# Patient Record
Sex: Female | Born: 1960 | Race: White | Hispanic: No | Marital: Married | Smoking: Current every day smoker
Health system: Southern US, Community
[De-identification: ages and names within clinical notes are randomized; demographics above are authoritative.]

## PROBLEM LIST (undated history)

## (undated) DIAGNOSIS — I639 Cerebral infarction, unspecified: Secondary | ICD-10-CM

## (undated) DIAGNOSIS — E785 Hyperlipidemia, unspecified: Secondary | ICD-10-CM

## (undated) DIAGNOSIS — G4733 Obstructive sleep apnea (adult) (pediatric): Secondary | ICD-10-CM

## (undated) DIAGNOSIS — F419 Anxiety disorder, unspecified: Secondary | ICD-10-CM

## (undated) HISTORY — PX: ABDOMINAL HYSTERECTOMY: SHX81

---

## 2008-11-21 ENCOUNTER — Emergency Department (HOSPITAL_COMMUNITY): Admission: EM | Admit: 2008-11-21 | Discharge: 2008-11-21 | Payer: Self-pay | Admitting: Emergency Medicine

## 2013-07-13 ENCOUNTER — Emergency Department (HOSPITAL_COMMUNITY): Payer: BC Managed Care – PPO

## 2013-07-13 ENCOUNTER — Inpatient Hospital Stay (HOSPITAL_COMMUNITY)
Admission: EM | Admit: 2013-07-13 | Discharge: 2013-07-15 | DRG: 066 | Disposition: A | Discharge status: Home / Self Care | Payer: BC Managed Care – PPO | Attending: Internal Medicine | Admitting: Internal Medicine

## 2013-07-13 ENCOUNTER — Encounter (HOSPITAL_COMMUNITY): Payer: Self-pay | Admitting: Emergency Medicine

## 2013-07-13 DIAGNOSIS — E781 Pure hyperglyceridemia: Secondary | ICD-10-CM | POA: Diagnosis present

## 2013-07-13 DIAGNOSIS — H5462 Unqualified visual loss, left eye, normal vision right eye: Secondary | ICD-10-CM | POA: Diagnosis present

## 2013-07-13 DIAGNOSIS — Z79890 Hormone replacement therapy (postmenopausal): Secondary | ICD-10-CM

## 2013-07-13 DIAGNOSIS — Z6831 Body mass index (BMI) 31.0-31.9, adult: Secondary | ICD-10-CM

## 2013-07-13 DIAGNOSIS — E669 Obesity, unspecified: Secondary | ICD-10-CM | POA: Diagnosis present

## 2013-07-13 DIAGNOSIS — H47012 Ischemic optic neuropathy, left eye: Secondary | ICD-10-CM

## 2013-07-13 DIAGNOSIS — H5460 Unqualified visual loss, one eye, unspecified: Secondary | ICD-10-CM | POA: Diagnosis present

## 2013-07-13 DIAGNOSIS — H547 Unspecified visual loss: Secondary | ICD-10-CM | POA: Diagnosis present

## 2013-07-13 DIAGNOSIS — Z79899 Other long term (current) drug therapy: Secondary | ICD-10-CM

## 2013-07-13 DIAGNOSIS — F17200 Nicotine dependence, unspecified, uncomplicated: Secondary | ICD-10-CM | POA: Diagnosis present

## 2013-07-13 DIAGNOSIS — I6350 Cerebral infarction due to unspecified occlusion or stenosis of unspecified cerebral artery: Principal | ICD-10-CM | POA: Diagnosis present

## 2013-07-13 DIAGNOSIS — F411 Generalized anxiety disorder: Secondary | ICD-10-CM | POA: Diagnosis present

## 2013-07-13 DIAGNOSIS — I639 Cerebral infarction, unspecified: Secondary | ICD-10-CM | POA: Diagnosis present

## 2013-07-13 DIAGNOSIS — E785 Hyperlipidemia, unspecified: Secondary | ICD-10-CM | POA: Diagnosis present

## 2013-07-13 DIAGNOSIS — I10 Essential (primary) hypertension: Secondary | ICD-10-CM | POA: Diagnosis present

## 2013-07-13 DIAGNOSIS — G4733 Obstructive sleep apnea (adult) (pediatric): Secondary | ICD-10-CM | POA: Diagnosis present

## 2013-07-13 HISTORY — DX: Hyperlipidemia, unspecified: E78.5

## 2013-07-13 HISTORY — DX: Anxiety disorder, unspecified: F41.9

## 2013-07-13 HISTORY — DX: Obstructive sleep apnea (adult) (pediatric): G47.33

## 2013-07-13 LAB — I-STAT CHEM 8, ED
BUN: 15 mg/dL (ref 6–23)
CALCIUM ION: 1.27 mmol/L — AB (ref 1.12–1.23)
Chloride: 100 mEq/L (ref 96–112)
Creatinine, Ser: 0.9 mg/dL (ref 0.50–1.10)
Glucose, Bld: 116 mg/dL — ABNORMAL HIGH (ref 70–99)
HEMATOCRIT: 41 % (ref 36.0–46.0)
Hemoglobin: 13.9 g/dL (ref 12.0–15.0)
Potassium: 3.2 mEq/L — ABNORMAL LOW (ref 3.7–5.3)
Sodium: 142 mEq/L (ref 137–147)
TCO2: 28 mmol/L (ref 0–100)

## 2013-07-13 LAB — CBC WITH DIFFERENTIAL/PLATELET
BASOS ABS: 0 10*3/uL (ref 0.0–0.1)
BASOS PCT: 0 % (ref 0–1)
EOS PCT: 1 % (ref 0–5)
Eosinophils Absolute: 0.1 10*3/uL (ref 0.0–0.7)
HCT: 38.6 % (ref 36.0–46.0)
Hemoglobin: 13.6 g/dL (ref 12.0–15.0)
Lymphocytes Relative: 27 % (ref 12–46)
Lymphs Abs: 2.4 10*3/uL (ref 0.7–4.0)
MCH: 31.5 pg (ref 26.0–34.0)
MCHC: 35.2 g/dL (ref 30.0–36.0)
MCV: 89.4 fL (ref 78.0–100.0)
MONO ABS: 0.8 10*3/uL (ref 0.1–1.0)
Monocytes Relative: 9 % (ref 3–12)
NEUTROS ABS: 5.5 10*3/uL (ref 1.7–7.7)
Neutrophils Relative %: 63 % (ref 43–77)
Platelets: 346 10*3/uL (ref 150–400)
RBC: 4.32 MIL/uL (ref 3.87–5.11)
RDW: 13 % (ref 11.5–15.5)
WBC: 8.8 10*3/uL (ref 4.0–10.5)

## 2013-07-13 NOTE — ED Notes (Signed)
Patient states she has been having trouble seeing in her left eye since last Thursday.  Patient is from [facility] in [place].  Patient states she does have some eye pain and has a "gray line" in her field of vision.  Patient states that she can only see below the line.  She can not see in front of her and she can see to the right and left.

## 2013-07-14 ENCOUNTER — Inpatient Hospital Stay (HOSPITAL_COMMUNITY): Payer: BC Managed Care – PPO

## 2013-07-14 DIAGNOSIS — H5462 Unqualified visual loss, left eye, normal vision right eye: Secondary | ICD-10-CM | POA: Diagnosis present

## 2013-07-14 DIAGNOSIS — H5460 Unqualified visual loss, one eye, unspecified: Secondary | ICD-10-CM

## 2013-07-14 DIAGNOSIS — H47019 Ischemic optic neuropathy, unspecified eye: Secondary | ICD-10-CM

## 2013-07-14 DIAGNOSIS — I6350 Cerebral infarction due to unspecified occlusion or stenosis of unspecified cerebral artery: Principal | ICD-10-CM

## 2013-07-14 DIAGNOSIS — I639 Cerebral infarction, unspecified: Secondary | ICD-10-CM | POA: Diagnosis present

## 2013-07-14 DIAGNOSIS — I6789 Other cerebrovascular disease: Secondary | ICD-10-CM

## 2013-07-14 DIAGNOSIS — H547 Unspecified visual loss: Secondary | ICD-10-CM | POA: Diagnosis present

## 2013-07-14 LAB — COMPREHENSIVE METABOLIC PANEL
ALK PHOS: 70 U/L (ref 39–117)
ALT: 11 U/L (ref 0–35)
AST: 15 U/L (ref 0–37)
Albumin: 3.9 g/dL (ref 3.5–5.2)
BUN: 18 mg/dL (ref 6–23)
CHLORIDE: 96 meq/L (ref 96–112)
CO2: 27 meq/L (ref 19–32)
Calcium: 10.1 mg/dL (ref 8.4–10.5)
Creatinine, Ser: 0.79 mg/dL (ref 0.50–1.10)
GFR calc non Af Amer: >90 mL/min (ref >90)
GLUCOSE: 96 mg/dL (ref 70–99)
POTASSIUM: 3.4 meq/L — AB (ref 3.7–5.3)
Sodium: 139 mEq/L (ref 137–147)
Total Protein: 7.7 g/dL (ref 6.0–8.3)

## 2013-07-14 LAB — HEMOGLOBIN A1C
Hgb A1c MFr Bld: 5.3 % (ref <5.7)
Mean Plasma Glucose: 105 mg/dL (ref <117)

## 2013-07-14 LAB — URINALYSIS, ROUTINE W REFLEX MICROSCOPIC
Bilirubin Urine: NEGATIVE
GLUCOSE, UA: NEGATIVE mg/dL
Hgb urine dipstick: NEGATIVE
Ketones, ur: NEGATIVE mg/dL
Leukocytes, UA: NEGATIVE
Nitrite: NEGATIVE
Protein, ur: NEGATIVE mg/dL
SPECIFIC GRAVITY, URINE: 1.011 (ref 1.005–1.030)
UROBILINOGEN UA: 0.2 mg/dL (ref 0.0–1.0)
pH: 6.5 (ref 5.0–8.0)

## 2013-07-14 LAB — C-REACTIVE PROTEIN: CRP: 1.6 mg/dL — AB (ref <0.60)

## 2013-07-14 LAB — LIPID PANEL
Cholesterol: 212 mg/dL — ABNORMAL HIGH (ref 0–200)
HDL: 29 mg/dL — ABNORMAL LOW (ref >39)
LDL Cholesterol: UNDETERMINED mg/dL (ref 0–99)
Total CHOL/HDL Ratio: 7.3 RATIO
Triglycerides: 531 mg/dL — ABNORMAL HIGH (ref <150)
VLDL: UNDETERMINED mg/dL (ref 0–40)

## 2013-07-14 LAB — RAPID URINE DRUG SCREEN, HOSP PERFORMED
Amphetamines: NOT DETECTED
Barbiturates: NOT DETECTED
Benzodiazepines: NOT DETECTED
Cocaine: NOT DETECTED
Opiates: NOT DETECTED
TETRAHYDROCANNABINOL: NOT DETECTED

## 2013-07-14 LAB — I-STAT TROPONIN, ED: Troponin i, poc: 0 ng/mL (ref 0.00–0.08)

## 2013-07-14 LAB — PROTIME-INR
INR: 0.89 (ref 0.00–1.49)
Prothrombin Time: 11.9 seconds (ref 11.6–15.2)

## 2013-07-14 LAB — APTT: aPTT: 29 seconds (ref 24–37)

## 2013-07-14 LAB — SEDIMENTATION RATE: SED RATE: 20 mm/h (ref 0–22)

## 2013-07-14 LAB — ETHANOL

## 2013-07-14 MED ORDER — ATORVASTATIN CALCIUM 10 MG PO TABS
20.0000 mg | ORAL_TABLET | Freq: Every day | ORAL | Status: DC
  Administered 2013-07-14: 20 mg via ORAL
  Filled 2013-07-14: qty 2

## 2013-07-14 MED ORDER — CETIRIZINE HCL 10 MG PO TABS
10.0000 mg | ORAL_TABLET | Freq: Every day | ORAL | Status: DC
  Administered 2013-07-14 – 2013-07-15 (×2): 10 mg via ORAL
  Filled 2013-07-14 (×2): qty 1

## 2013-07-14 MED ORDER — DESVENLAFAXINE SUCCINATE ER 100 MG PO TB24
100.0000 mg | ORAL_TABLET | Freq: Every day | ORAL | Status: DC
  Administered 2013-07-14 – 2013-07-15 (×2): 100 mg via ORAL
  Filled 2013-07-14 (×2): qty 1

## 2013-07-14 MED ORDER — ESTRADIOL 1 MG PO TABS
1.0000 mg | ORAL_TABLET | Freq: Every day | ORAL | Status: DC
  Filled 2013-07-14: qty 1

## 2013-07-14 MED ORDER — LORAZEPAM 2 MG/ML IJ SOLN
2.0000 mg | Freq: Once | INTRAMUSCULAR | Status: AC
  Administered 2013-07-14: 2 mg via INTRAVENOUS
  Filled 2013-07-14: qty 1

## 2013-07-14 MED ORDER — FENOFIBRATE 160 MG PO TABS
160.0000 mg | ORAL_TABLET | Freq: Every day | ORAL | Status: DC
  Administered 2013-07-14 – 2013-07-15 (×2): 160 mg via ORAL
  Filled 2013-07-14 (×2): qty 1

## 2013-07-14 MED ORDER — HEPARIN SODIUM (PORCINE) 5000 UNIT/ML IJ SOLN
5000.0000 [IU] | Freq: Three times a day (TID) | INTRAMUSCULAR | Status: DC
  Administered 2013-07-14 – 2013-07-15 (×4): 5000 [IU] via SUBCUTANEOUS
  Filled 2013-07-14 (×4): qty 1

## 2013-07-14 MED ORDER — PNEUMOCOCCAL VAC POLYVALENT 25 MCG/0.5ML IJ INJ
0.5000 mL | INJECTION | Freq: Once | INTRAMUSCULAR | Status: AC
  Administered 2013-07-14: 0.5 mL via INTRAMUSCULAR
  Filled 2013-07-14: qty 0.5

## 2013-07-14 MED ORDER — LEVOCETIRIZINE DIHYDROCHLORIDE 5 MG PO TABS: 5.0000 mg | ORAL_TABLET | Freq: Every evening | ORAL | Status: DC

## 2013-07-14 MED ORDER — HYDROCHLOROTHIAZIDE 25 MG PO TABS
25.0000 mg | ORAL_TABLET | Freq: Every day | ORAL | Status: DC
  Administered 2013-07-14 – 2013-07-15 (×2): 25 mg via ORAL
  Filled 2013-07-14 (×2): qty 1

## 2013-07-14 MED ORDER — IBUPROFEN 400 MG PO TABS
400.0000 mg | ORAL_TABLET | Freq: Four times a day (QID) | ORAL | Status: DC | PRN
  Filled 2013-07-14: qty 1

## 2013-07-14 NOTE — Progress Notes (Signed)
[facility] PROGRESS NOTE  Stephanie Booker A Garoutte ZOX:[id number] DOB: 08/08/1960 DOA: 07/13/2013 PCP: No primary provider on file.  Assessment/Plan: 1. Left eye vision loss.  -Patient reporting blurriness and loss of eyesight to the inferior visual field, started last thursday, progressively worse -She was seen and evaluated by neurology, undergoing ischemic workup. CT scan of brain performed on admission did not reveal acute intracranial abnormalities -Pending MRI of brain, 2-D echo and bilateral carotid Dopplers  2.  Dyslipidemia. -Fasting lipid panel showing total cholesterol of 212 with triglycerides of 531. -Lipitor added  3. Hypertension -Patient on hydrochlorothiazide 25 mg by mouth daily. Blood pressures controlled  Code Status: Full Code Family Communication: Spoke with Family members at bedside Disposition Plan: Pending CVA workup   Consultants:  Neurology    HPI/Subjective: Patient is a pleasant 53 year old female with a past medical history of hypertension, dyslipidemia, obstructive sleep apnea, admitted to the medicine service early this morning, presenting with left-sided visual changes. She reports having vision loss in the inferior aspect of her visual field associated with blurriness that started that thurs, progressively worse. Initial CT scan of brain did not show acute intracranial changes. She was seen and evaluated by neurology, undergoing ischemic work up. Currently pending MRI of brain.   Objective: Filed Vitals:   07/14/13 0944  BP: 113/78  Pulse: 82  Temp:   Resp: 18   No intake or output data in the 24 hours ending 07/14/13 1331 Filed Weights   07/13/13 2008  Weight: 67.677 kg (149 lb 3.2 oz)    Exam:   General:  Patient is sitting up having lunch, in no acute distress, awake alert oriented  Cardiovascular: Regular rate and rhythm normal S1 is  Respiratory: Clear to auscultation bilaterally  Abdomen: Soft nontender  nondistended  Musculoskeletal: No edema  Neurological: Patient having loss of vision to inferior visual fields from her left eye. Extraocular movement was intact in both eyes. Pupils did react to light bilaterally. No evidence of ocular trauma. Rest of neurological exam unremarkable. 5 out of 5 muscle strength without alteration to  Data Reviewed: Basic Metabolic Panel:  Recent Labs Lab 07/13/13 2056 07/14/13 0051  NA 142 139  K 3.2* 3.4*  CL 100 96  CO2  --  27  GLUCOSE 116* 96  BUN 15 18  CREATININE 0.90 0.79  CALCIUM  --  10.1   Liver Function Tests:  Recent Labs Lab 07/14/13 0051  AST 15  ALT 11  ALKPHOS 70  BILITOT <0.2*  PROT 7.7  ALBUMIN 3.9   No results found for this basename: LIPASE, AMYLASE,  in the last 168 hours No results found for this basename: AMMONIA,  in the last 168 hours CBC:  Recent Labs Lab 07/13/13 2020 07/13/13 2056  WBC 8.8  --   NEUTROABS 5.5  --   HGB 13.6 13.9  HCT 38.6 41.0  MCV 89.4  --   PLT 346  --    Cardiac Enzymes: No results found for this basename: CKTOTAL, CKMB, CKMBINDEX, TROPONINI,  in the last 168 hours BNP (last 3 results) No results found for this basename: PROBNP,  in the last 8760 hours CBG: No results found for this basename: GLUCAP,  in the last 168 hours  No results found for this or any previous visit (from the past 240 hour(s)).   Studies: Dg Chest 2 View  07/14/2013   CLINICAL DATA:  Hypertension.  Stroke.  EXAM: CHEST  2 VIEW  COMPARISON:  None.  FINDINGS: Mediastinum and hilar structures are normal. Lungs are clear. Heart size normal. No pleural effusion or pneumothorax. Degenerative changes thoracic spine.  IMPRESSION: No active cardiopulmonary disease.   Electronically Signed   By: Maisie Fushomas  Register   On: 07/14/2013 09:32   Ct Head Wo Contrast  07/13/2013   CLINICAL DATA:  Vision changes, eye pain.  EXAM: CT HEAD WITHOUT CONTRAST  TECHNIQUE: Contiguous axial images were obtained from the base of the  skull through the vertex without intravenous contrast.  COMPARISON:  None.  FINDINGS: There is no acute intracranial hemorrhage or infarct. No mass lesion or midline shift. Gray-white matter differentiation is well maintained. Ventricles are normal in size without evidence of hydrocephalus. CSF containing spaces are within normal limits. No extra-axial fluid collection.  The calvarium is intact.  Orbital soft tissues are within normal limits.  There is near complete opacification of the right sphenoid sinus and right sphenoid ethmoidal recess. Otherwise, the paranasal sinuses and mastoid air cells are well pneumatized and free of fluid.  Scalp soft tissues are unremarkable.  IMPRESSION: 1. Normal head CT with no acute intracranial abnormality identified. 2. Right sphenoid sinus disease.   Electronically Signed   By: Rise MuBenjamin  McClintock M.D.   On: 07/13/2013 20:51    Scheduled Meds: . atorvastatin  20 mg Oral q1800  . cetirizine  10 mg Oral Daily  . desvenlafaxine  100 mg Oral Daily  . fenofibrate  160 mg Oral Daily  . heparin  5,000 Units Subcutaneous 3 times per day  . hydrochlorothiazide  25 mg Oral Daily  . LORazepam  2 mg Intravenous Once   Continuous Infusions:   Principal Problem:   Vision loss, left eye Active Problems:   Stroke    Time spent: 25 min    Jeralyn BennettZAMORA, EZEQUIEL  [facility] Pager [phone]. If 7PM-7AM, please contact night-coverage at [email or web address], password [email or web address] 07/14/2013, 1:31 PM  LOS: 1 day

## 2013-07-14 NOTE — ED Provider Notes (Signed)
CSN: [id number]     Arrival date & time 07/13/13  1905 History   First MD Initiated Contact with Patient 07/14/13 0032     Chief Complaint  Patient presents with  . Eye Pain      Patient is a 53 y.o. female presenting with eye problem. The history is provided by the patient.  Eye Problem Location:  L eye Severity:  Mild Onset quality:  Sudden Duration:  4 days Timing:  Constant Progression:  Worsening Chronicity:  New Relieved by:  None tried Worsened by:  Nothing tried Associated symptoms: no vomiting and no weakness   Pt reports she had episode of severe chest pain last Wednesday It resolved, but then the next day she woke up with decreased vision in left eye No trauma.  She reports only minimal pain.  She has never had eye surgery She has never had issues with her left eye  She reports she was seen [place] eye [place] and sent to ER for evaluation of "stroke or a heart attack" She reports they did dilate her eyes today at the office  No slurred speech or focal weakness reported   Past Medical History  Diagnosis Date  . Hypertension   . Hyperlipidemia   . Anxiety   . OSA (obstructive sleep apnea)    Past Surgical History  Procedure Laterality Date  . Abdominal hysterectomy     History reviewed. No pertinent family history. History  Substance Use Topics  . Smoking status: Current Every Day Smoker -- 1.00 packs/day  . Smokeless tobacco: Not on file  . Alcohol Use: No   OB History   Grav Para Term Preterm Abortions TAB SAB Ect Mult Living                 Review of Systems  Constitutional: Negative for fever.  Eyes: Positive for visual disturbance.  Respiratory: Negative for shortness of breath.   Gastrointestinal: Negative for vomiting.  Neurological: Negative for speech difficulty and weakness.  All other systems reviewed and are negative.     Allergies  Review of patient's allergies indicates no known allergies.  Home Medications    Prior to Admission medications   Medication Sig Start Date End Date Taking? Authorizing Shadavia Dampier  desvenlafaxine (PRISTIQ) 100 MG 24 hr tablet Take 100 mg by mouth daily.   Yes Historical Claressa Hughley, MD  estradiol (ESTRACE) 1 MG tablet Take 1 mg by mouth daily.   Yes Historical Shalin Linders, MD  fenofibrate 160 MG tablet Take 160 mg by mouth daily.   Yes Historical Lastacia Solum, MD  hydrochlorothiazide (HYDRODIURIL) 25 MG tablet Take 25 mg by mouth daily.   Yes Historical Emmauel Hallums, MD  ibuprofen (ADVIL,MOTRIN) 200 MG tablet Take 400 mg by mouth every 6 (six) hours as needed for headache (pain).   Yes Historical Corene Resnick, MD  levocetirizine (XYZAL) 5 MG tablet Take 5 mg by mouth every evening.   Yes Historical Ngai Parcell, MD   BP 114/75  Pulse 94  Temp(Src) 98.4 F (36.9 C) (Oral)  Resp 16  Ht 4\' 10"  (1.473 m)  Wt 149 lb 3.2 oz (67.677 kg)  BMI 31.19 kg/m2  SpO2 96% Physical Exam CONSTITUTIONAL: Well developed/well nourished HEAD: Normocephalic/atraumatic EYES: EOMI/PERRL. No ptosis. She reports decreased vision in OS, she reports she can only see in lower field of vision. ENMT: Mucous membranes moist NECK: supple no meningeal signs SPINE:entire spine nontender CV: S1/S2 noted, no murmurs/rubs/gallops noted LUNGS: Lungs are clear to auscultation bilaterally, no apparent  distress ABDOMEN: soft, nontender, no rebound or guarding GU:no cva tenderness NEURO: Pt is awake/alert, moves all extremitiesx4 No arm/leg drift No facial droop noted No focal sensory deficit.  EXTREMITIES: pulses normal, full ROM SKIN: warm, color normal PSYCH: no abnormalities of mood noted  ED Course  Procedures  1:16 AM Pt sent from eye specialist in [place] for possible acute ischemic optic neuropathy 1:30 AM D/w dr Karleen Hampshirespencer on call for eye recommends stroke workup but also eval for temporal arteritis 3:14 AM D/w dr Posey Prontoreyndols with neuro and dr gardner with [facility] Will admit for stroke  workup  Labs Review Labs Reviewed  I-STAT CHEM 8, ED - Abnormal; Notable for the following:    Potassium 3.2 (*)    Glucose, Bld 116 (*)    Calcium, Ion 1.27 (*)    All other components within normal limits  CBC WITH DIFFERENTIAL  ETHANOL  PROTIME-INR  APTT  COMPREHENSIVE METABOLIC PANEL  URINE RAPID DRUG SCREEN (HOSP PERFORMED)  URINALYSIS, ROUTINE W REFLEX MICROSCOPIC  I-STAT TROPOININ, ED  I-STAT TROPOININ, ED    Imaging Review Ct Head Wo Contrast  07/13/2013   CLINICAL DATA:  Vision changes, eye pain.  EXAM: CT HEAD WITHOUT CONTRAST  TECHNIQUE: Contiguous axial images were obtained from the base of the skull through the vertex without intravenous contrast.  COMPARISON:  None.  FINDINGS: There is no acute intracranial hemorrhage or infarct. No mass lesion or midline shift. Gray-white matter differentiation is well maintained. Ventricles are normal in size without evidence of hydrocephalus. CSF containing spaces are within normal limits. No extra-axial fluid collection.  The calvarium is intact.  Orbital soft tissues are within normal limits.  There is near complete opacification of the right sphenoid sinus and right sphenoid ethmoidal recess. Otherwise, the paranasal sinuses and mastoid air cells are well pneumatized and free of fluid.  Scalp soft tissues are unremarkable.  IMPRESSION: 1. Normal head CT with no acute intracranial abnormality identified. 2. Right sphenoid sinus disease.   Electronically Signed   By: Rise MuBenjamin  McClintock M.D.   On: 07/13/2013 20:51     EKG Interpretation   Date/Time:  Tuesday July 14 2013 00:54:12 EDT Ventricular Rate:  90 PR Interval:  173 QRS Duration: 86 QT Interval:  385 QTC Calculation: 471 R Axis:   64 Text Interpretation:  Sinus rhythm Borderline T abnormalities, anterior  leads Baseline wander in lead(s) V3 V4 V5 V6 No previous ECGs available  Confirmed by Bebe ShaggyWICKLINE  MD, Dorinda HillNALD ([id number]) on 07/14/2013 12:57:56 AM      MDM   Final  diagnoses:  Stroke    Nursing notes including past medical history and social history reviewed and considered in documentation Labs/vital reviewed and considered     Joya Gaskinsonald W Wickline, MD 07/14/13 [phone]

## 2013-07-14 NOTE — Progress Notes (Signed)
Pt. Was placed on CPAP auto titrate (min: 5, max: 15) via FFM (what pt. Wears at home) with 2L O2 bled in. Pt. Is tolerating CPAP well at this time without any complications.

## 2013-07-14 NOTE — Progress Notes (Signed)
Received report from Darnelle Goingiffany Teachey RN at [phone]. Room ready and awaiting arrival of pt to unit.

## 2013-07-14 NOTE — Progress Notes (Signed)
Stroke Team Progress Note  HISTORY Chief Complaint: Decreased vision left eye  HPI: Stephanie Booker is an 53 y.o. female who reports that on Wednesday of last week she began to have chest pain. She had no visual complaints at that time. She went to bed and on awakening on Thursday she had difficulty with vision in the left eye. Although vision throughout in the left eye was impaired she had particular difficulty looking upward. The patient was not able to get in touch with her eye doctor until today. After evaluation the patient was directed to present to the ED for further evaluation. The patient reports no additional neurological complaints.  Date last known well: Date: 07/09/2013  Time last known well: Time: 00:00  tPA Given: No: Outside time window   Patient was not administered TPA secondary to outside time window.  Patient was admitted to the Internal Medicine for further evaluation and treatment. Neurology is consulting.   SUBJECTIVE Husband is at bedside, the patient is alert and cooperative, complains of headache.  OBJECTIVE Most recent Vital Signs: Filed Vitals:   07/14/13 0300 07/14/13 0315 07/14/13 0400 07/14/13 0600  BP: 104/68 106/66 114/73 112/61  Pulse: 92 88 79 78  Temp:   97.9 F (36.6 C) 98 F (36.7 C)  TempSrc:   Oral Oral  Resp: 21 29 20 18   Height:      Weight:      SpO2: 88% 91% 94% 97%   CBG (last 3)  No results found for this basename: GLUCAP,  in the last 72 hours  IV Fluid Intake:     MEDICATIONS  . cetirizine  10 mg Oral Daily  . desvenlafaxine  100 mg Oral Daily  . fenofibrate  160 mg Oral Daily  . heparin  5,000 Units Subcutaneous 3 times per day  . hydrochlorothiazide  25 mg Oral Daily  . pneumococcal 23 valent vaccine  0.5 mL Intramuscular Once   PRN:    Diet:  General thin liquids Activity:  As tolerated DVT Prophylaxis:  SQ heparin  CLINICALLY SIGNIFICANT STUDIES Basic Metabolic Panel:  Recent Labs Lab 07/13/13 2056 07/14/13 0051   NA 142 139  K 3.2* 3.4*  CL 100 96  CO2  --  27  GLUCOSE 116* 96  BUN 15 18  CREATININE 0.90 0.79  CALCIUM  --  10.1   Liver Function Tests:  Recent Labs Lab 07/14/13 0051  AST 15  ALT 11  ALKPHOS 70  BILITOT <0.2*  PROT 7.7  ALBUMIN 3.9   CBC:  Recent Labs Lab 07/13/13 2020 07/13/13 2056  WBC 8.8  --   NEUTROABS 5.5  --   HGB 13.6 13.9  HCT 38.6 41.0  MCV 89.4  --   PLT 346  --    Coagulation:  Recent Labs Lab 07/14/13 0051  LABPROT 11.9  INR 0.89   Cardiac Enzymes: No results found for this basename: CKTOTAL, CKMB, CKMBINDEX, TROPONINI,  in the last 168 hours Urinalysis:  Recent Labs Lab 07/14/13 0058  COLORURINE YELLOW  LABSPEC 1.011  PHURINE 6.5  GLUCOSEU NEGATIVE  HGBUR NEGATIVE  BILIRUBINUR NEGATIVE  KETONESUR NEGATIVE  PROTEINUR NEGATIVE  UROBILINOGEN 0.2  NITRITE NEGATIVE  LEUKOCYTESUR NEGATIVE   Lipid Panel    Component Value Date/Time   CHOL 212* 07/14/2013 0043   TRIG 531* 07/14/2013 0043   HDL 29* 07/14/2013 0043   CHOLHDL 7.3 07/14/2013 0043   VLDL UNABLE TO CALCULATE IF TRIGLYCERIDE OVER 400 mg/dL 03/07/863 7846  LDLCALC UNABLE TO CALCULATE IF TRIGLYCERIDE OVER 400 mg/dL 2/13/08656/23/2015 78460043   [id number]  No results found for this basename: HGBA1C    Urine Drug Screen:     Component Value Date/Time   LABOPIA NONE DETECTED 07/14/2013 0058   COCAINSCRNUR NONE DETECTED 07/14/2013 0058   LABBENZ NONE DETECTED 07/14/2013 0058   AMPHETMU NONE DETECTED 07/14/2013 0058   THCU NONE DETECTED 07/14/2013 0058   LABBARB NONE DETECTED 07/14/2013 0058    Alcohol Level:  Recent Labs Lab 07/14/13 0051  ETH <11    Ct Head Wo Contrast  07/13/2013   CLINICAL DATA:  Vision changes, eye pain.  EXAM: CT HEAD WITHOUT CONTRAST  TECHNIQUE: Contiguous axial images were obtained from the base of the skull through the vertex without intravenous contrast.  COMPARISON:  None.  FINDINGS: There is no acute intracranial hemorrhage or infarct. No mass lesion or  midline shift. Gray-white matter differentiation is well maintained. Ventricles are normal in size without evidence of hydrocephalus. CSF containing spaces are within normal limits. No extra-axial fluid collection.  The calvarium is intact.  Orbital soft tissues are within normal limits.  There is near complete opacification of the right sphenoid sinus and right sphenoid ethmoidal recess. Otherwise, the paranasal sinuses and mastoid air cells are well pneumatized and free of fluid.  Scalp soft tissues are unremarkable.  IMPRESSION: 1. Normal head CT with no acute intracranial abnormality identified. 2. Right sphenoid sinus disease.   Electronically Signed   By: Rise MuBenjamin  McClintock M.D.   On: 07/13/2013 20:51    CT of the brain   IMPRESSION:  1. Normal head CT with no acute intracranial abnormality identified.  2. Right sphenoid sinus disease.  MRI of the brain    MRA of the brain    2D Echocardiogram    Carotid Doppler    CXR   IMPRESSION:  No active cardiopulmonary disease  EKG .  Sinus rhythm Borderline T abnormalities, anterior leads Baseline wander in lead(s) V3 V4 V5 V6  Therapy Recommendations pending  Physical Exam  General: The patient is alert and cooperative at the time of the examination.  Skin: No significant peripheral edema is noted.   Neurologic Exam  Mental status: The patient is oriented x 3.  Cranial nerves: Facial symmetry is present. Speech is normal, no aphasia or dysarthria is noted. Extraocular movements are full. Visual fields are full with the right eye, the patient has an altitudinal defect, superior with the left eye.  Motor: The patient has good strength in all 4 extremities.  Sensory examination: Soft touch sensation is symmetric on the face, arms, and legs.  Coordination: The patient has good finger-nose-finger and heel-to-shin bilaterally.  Gait and station: The gait was not tested. No drift is seen with the arms.  Reflexes: Deep tendon  reflexes are symmetric.   ASSESSMENT Ms. Stephanie Shaggyeresa A Booker is a 53 y.o. female  Stroke work up underway. The patient was not a TPA candidate secondary to delay in presentation, minimal deficit. The patient was not on antiplatelet agents prior to admission. She has significant hypertriglyceridemia, and tobacco use in combination with estrogens. The patient presented with visual disturbance involving the left eye only.   Diabetes  Hypertriglyceridemia, dyslipidemia  Obesity  Tobacco use  Estrogen use  Hypertension  Anxiety disorder  Obstructive sleep apnea   Hospital day # 1  TREATMENT/PLAN  -MRI brain -MRA head -Carotid Doppler study -2-D echocardiogram -Aspirin therapy -Smoking cessation -Cessation of estrogen -Sedimentation rate -LDL could not  be calculated secondary to hypertriglyceridemia, will add Lipitor   SIGNED   I have personally obtained a history, examined the patient, evaluated imaging results, and formulated the assessment and plan of care. I agree with the above.    To contact Stroke Continuity provider, please refer to [email or web address]. After hours, contact General Neurology  Lesly DukesWILLIS,CHARLES KEITH

## 2013-07-14 NOTE — Evaluation (Signed)
Speech Language Pathology Evaluation Patient Details Name: Stephanie Stephanie MRN: [id number] DOB: 07/06/1960 Today's Date: 07/14/2013 Time: [id number] SLP Time Calculation (min): 20 min  Problem List:  Patient Active Problem List   Diagnosis Date Noted  . Stroke 07/14/2013  . Vision loss, left eye 07/14/2013   Past Medical History:  Past Medical History  Diagnosis Date  . Hypertension   . Hyperlipidemia   . Anxiety   . OSA (obstructive sleep apnea)    Past Surgical History:  Past Surgical History  Procedure Laterality Date  . Abdominal hysterectomy     HPI:  Stephanie Stephanie is a 53 y.o. female who on wed of last week she began to have chest pain. She had no visual complaints at that time. Her severe chest pain spontaneously resolved and she went to bed. Upon awakening on Thursday she had difficulty with vision in her left eye, loss of upward vision and peripheral vision, does have some downward peripheral vision in the eye. She was not able to get in touch with her eye doctor until today. After evaluation patient was directed to present to the ED for further evaluation with concern for ischemic etiology. No acute abnormalities on head CT. Passed stroke swallow screen. SLP eval ordered as part of stroke workup.    Assessment / Plan / Recommendation Clinical Impression  Based on tasks assessed, the patient's motor speech, language, and cognition are intact. Husband was also present in room and reported that he has not noticed any concerning changes in these skills since admission. Memory, decision making, and reasoning skills were assessed through functional verbal tasks and appear intact. Pt expressed that the only change experienced is decreased vision in left eye. Based on this evaluation, no further speech therapy is needed at this time, and speech will sign off. Please re-consult if concerns arise.     SLP Assessment  Patient does not need any further Speech Lanaguage Pathology  Services    Follow Up Recommendations  None    Frequency and Duration        Pertinent Vitals/Pain n/a   SLP Goals     SLP Evaluation Prior Functioning  Cognitive/Linguistic Baseline: Within functional limits  Lives With: Spouse   Cognition  Overall Cognitive Status: Within Functional Limits for tasks assessed Arousal/Alertness: Awake/alert Orientation Level: Oriented to person;Oriented to place;Oriented to situation (June 22nd for June 23rd) Attention: Selective Selective Attention: Appears intact Memory: Appears intact Awareness: Appears intact Problem Solving: Appears intact Executive Function: Reasoning;Decision Making Reasoning: Appears intact Decision Making: Appears intact Safety/Judgment: Other (comment) (Pt reported not anticipating new trouble d/t vision change)    Comprehension  Auditory Comprehension Overall Auditory Comprehension: Appears within functional limits for tasks assessed Yes/No Questions: Within Functional Limits Commands: Within Functional Limits Conversation: Complex    Expression Expression Primary Mode of Expression: Verbal Verbal Expression Overall Verbal Expression: Appears within functional limits for tasks assessed Initiation: No impairment Automatic Speech: Name Level of Generative/Spontaneous Verbalization: Conversation Repetition: No impairment Naming: No impairment Pragmatics: No impairment Non-Verbal Means of Communication: Not applicable   Oral / Motor Oral Motor/Sensory Function Overall Oral Motor/Sensory Function: Appears within functional limits for tasks assessed Motor Speech Overall Motor Speech: Appears within functional limits for tasks assessed   GO     Rebecca Eatonleksiak, Grayland Ormondmy K, MA, CCC-SLP 07/14/2013, 8:57 AM

## 2013-07-14 NOTE — Progress Notes (Signed)
Pt arrived via stretcher and ambulated from stretcher to bed. Pt transported by [employer]. Pt alert and oriented to unit, bed alarm on, call bell in place, telemetry started. Will continue to monitor.

## 2013-07-14 NOTE — Progress Notes (Signed)
  Echocardiogram 2D Echocardiogram has been performed.  Cathie BeamsGREGORY, ANGELA 07/14/2013, 12:24 PM

## 2013-07-14 NOTE — H&P (Signed)
[facility] History and Physical  Stephanie Booker XFG:[id number] DOB: 1960/12/29 DOA: 07/13/2013  Referring physician: EDP PCP: No primary provider on file.   Chief Complaint: L eye vision loss   HPI: Stephanie Booker is a 53 y.o. female who on wed of last week she began to have chest pain.  She had no visual complaints at that time.  Her severe chest pain spontaneously resolved and she went to bed.  Upon awakening on Thursday she had difficulty with vision in her left eye, loss of upward vision and peripheral vision, does have some downward peripheral vision in the eye.  She was not able to get in touch with her eye doctor until today.  After evaluation patient was directed to present to the ED for further evaluation with concern for ischemic etiology.  Review of Systems: Systems reviewed.  As above, otherwise negative  Past Medical History  Diagnosis Date  . Hypertension   . Hyperlipidemia   . Anxiety   . OSA (obstructive sleep apnea)    Past Surgical History  Procedure Laterality Date  . Abdominal hysterectomy     Social History:  reports that she has been smoking.  She does not have any smokeless tobacco history on file. She reports that she does not drink alcohol or use illicit drugs.  No Known Allergies  History reviewed. No pertinent family history.   Prior to Admission medications   Medication Sig Start Date End Date Taking? Authorizing Provider  desvenlafaxine (PRISTIQ) 100 MG 24 hr tablet Take 100 mg by mouth daily.   Yes Historical Provider, MD  estradiol (ESTRACE) 1 MG tablet Take 1 mg by mouth daily.   Yes Historical Provider, MD  fenofibrate 160 MG tablet Take 160 mg by mouth daily.   Yes Historical Provider, MD  hydrochlorothiazide (HYDRODIURIL) 25 MG tablet Take 25 mg by mouth daily.   Yes Historical Provider, MD  ibuprofen (ADVIL,MOTRIN) 200 MG tablet Take 400 mg by mouth every 6 (six) hours as needed for headache (pain).   Yes Historical Provider, MD   levocetirizine (XYZAL) 5 MG tablet Take 5 mg by mouth every evening.   Yes Historical Provider, MD   Physical Exam: Filed Vitals:   07/14/13 0400  BP: 114/73  Pulse: 79  Temp: 97.9 F (36.6 C)  Resp: 20    BP 114/73  Pulse 79  Temp(Src) 97.9 F (36.6 C) (Oral)  Resp 20  Ht $R'4\' 10"'tR$  (1.473 m)  Wt 67.677 kg (149 lb 3.2 oz)  BMI 31.19 kg/m2  SpO2 94%  General Appearance:    Alert, oriented, no distress, appears stated age  Head:    Normocephalic, atraumatic  Eyes:    When direct and consensual response intact to light shining in the R eye, No direct nor consensual response at most angles when shining a light in the L eye however, EOMI, sclera non-icteric        Nose:   Nares without drainage or epistaxis. Mucosa, turbinates normal  Throat:   Moist mucous membranes. Oropharynx without erythema or exudate.  Neck:   Supple. No carotid bruits.  No thyromegaly.  No lymphadenopathy.   Back:     No CVA tenderness, no spinal tenderness  Lungs:     Clear to auscultation bilaterally, without wheezes, rhonchi or rales  Chest wall:    No tenderness to palpitation  Heart:    Regular rate and rhythm without murmurs, gallops, rubs  Abdomen:     Soft, non-tender, nondistended, normal  bowel sounds, no organomegaly  Genitalia:    deferred  Rectal:    deferred  Extremities:   No clubbing, cyanosis or edema.  Pulses:   2+ and symmetric all extremities  Skin:   Skin color, texture, turgor normal, no rashes or lesions  Lymph nodes:   Cervical, supraclavicular, and axillary nodes normal  Neurologic:   See eye exam above regarding CN2, other CNs grossly intact. Normal strength, sensation and reflexes      throughout    Labs on Admission:  Basic Metabolic Panel:  Recent Labs Lab 07/13/13 2056 07/14/13 0051  NA 142 139  K 3.2* 3.4*  CL 100 96  CO2  --  27  GLUCOSE 116* 96  BUN 15 18  CREATININE 0.90 0.79  CALCIUM  --  10.1   Liver Function Tests:  Recent Labs Lab 07/14/13 0051   AST 15  ALT 11  ALKPHOS 70  BILITOT <0.2*  PROT 7.7  ALBUMIN 3.9   No results found for this basename: LIPASE, AMYLASE,  in the last 168 hours No results found for this basename: AMMONIA,  in the last 168 hours CBC:  Recent Labs Lab 07/13/13 2020 07/13/13 2056  WBC 8.8  --   NEUTROABS 5.5  --   HGB 13.6 13.9  HCT 38.6 41.0  MCV 89.4  --   PLT 346  --    Cardiac Enzymes: No results found for this basename: CKTOTAL, CKMB, CKMBINDEX, TROPONINI,  in the last 168 hours  BNP (last 3 results) No results found for this basename: PROBNP,  in the last 8760 hours CBG: No results found for this basename: GLUCAP,  in the last 168 hours  Radiological Exams on Admission: Ct Head Wo Contrast  07/13/2013   CLINICAL DATA:  Vision changes, eye pain.  EXAM: CT HEAD WITHOUT CONTRAST  TECHNIQUE: Contiguous axial images were obtained from the base of the skull through the vertex without intravenous contrast.  COMPARISON:  None.  FINDINGS: There is no acute intracranial hemorrhage or infarct. No mass lesion or midline shift. Gray-white matter differentiation is well maintained. Ventricles are normal in size without evidence of hydrocephalus. CSF containing spaces are within normal limits. No extra-axial fluid collection.  The calvarium is intact.  Orbital soft tissues are within normal limits.  There is near complete opacification of the right sphenoid sinus and right sphenoid ethmoidal recess. Otherwise, the paranasal sinuses and mastoid air cells are well pneumatized and free of fluid.  Scalp soft tissues are unremarkable.  IMPRESSION: 1. Normal head CT with no acute intracranial abnormality identified. 2. Right sphenoid sinus disease.   Electronically Signed   By: Jeannine Boga M.D.   On: 07/13/2013 20:51    EKG: Independently reviewed.  Assessment/Plan Principal Problem:   Vision loss, left eye Active Problems:   Stroke   1. Vision loss in the L eye - highly concerning for ischemic  etiology, stroke work up has been ordered.  ESR 20 and no headache so doubt GCA, CRP pending.  ASA 325 daily, neurology has seen patient in consult.  MRI pending.  Also unclear what relation (if any) the chest pain had to this event.  Given complete resolution of chest pain, dissection seems unlikely this far out.  2d echo is pending.  Code Status: Full Code  Family Communication: Husband at bedside Disposition Plan: Admit to inpatient   Time spent: 70 min  GARDNER, JARED M. [facility] Pager [phone]  If 7AM-7PM, please contact the day team  taking care of the patient [email or web address] Password [email or web address] 07/14/2013, 4:40 AM

## 2013-07-14 NOTE — Progress Notes (Signed)
*  PRELIMINARY RESULTS* Vascular Ultrasound Carotid Duplex (Doppler) has been completed. Findings suggest 1-39% internal carotid artery stenosis bilaterally. Vertebral arteries are patent with antegrade flow.  07/14/2013 11:53 AM Gertie FeyMichelle Simonetti, RVT, RDCS, RDMS

## 2013-07-14 NOTE — Consult Note (Signed)
Referring Physician: Wickline    Chief Complaint: Decreased vision left eye  HPI: Stephanie Booker is an 52 y.o. female who reports that on Wednesday of last week she began to have chest pain.  She had no visual complaints at that time.  She went to bed and on awakening on Thursday she had difficulty with vision in the left eye.  Although vision throughout in the left eye was impaired she had particular difficulty looking upward.  The patient was not able to get in touch with her eye doctor until today.  After evaluation the patient was directed to present to the ED for further evaluation.  The patient reports no additional neurological complaints.    Date last known well: Date: 07/09/2013 Time last known well: Time: 00:00 tPA Given: No: Outside time window  Past Medical History  Diagnosis Date  . Hypertension   . Hyperlipidemia   . Anxiety   . OSA (obstructive sleep apnea)     Past Surgical History  Procedure Laterality Date  . Abdominal hysterectomy      Family history: Father deceased with alcoholism.  Mother alive with a history of breast cancer and hypertension  Social History:  reports that she has been smoking.  She does not have any smokeless tobacco history on file. She reports that she does not drink alcohol or use illicit drugs.  Allergies: No Known Allergies  Medications: I have reviewed the patient's current medications. Prior to Admission:  Current outpatient prescriptions:desvenlafaxine (PRISTIQ) 100 MG 24 hr tablet, Take 100 mg by mouth daily., Disp: , Rfl: ;  estradiol (ESTRACE) 1 MG tablet, Take 1 mg by mouth daily., Disp: , Rfl: ;  fenofibrate 160 MG tablet, Take 160 mg by mouth daily., Disp: , Rfl: ;  hydrochlorothiazide (HYDRODIURIL) 25 MG tablet, Take 25 mg by mouth daily., Disp: , Rfl:  ibuprofen (ADVIL,MOTRIN) 200 MG tablet, Take 400 mg by mouth every 6 (six) hours as needed for headache (pain)., Disp: , Rfl: ;  levocetirizine (XYZAL) 5 MG tablet, Take 5 mg by  mouth every evening., Disp: , Rfl:   ROS: History obtained from the patient  General ROS: negative for - chills, fatigue, fever, night sweats, weight gain or weight loss Psychological ROS: negative for - behavioral disorder, hallucinations, memory difficulties, mood swings or suicidal ideation Ophthalmic ROS: as noted in HPI ENT ROS: negative for - epistaxis, nasal discharge, oral lesions, sore throat, tinnitus or vertigo Allergy and Immunology ROS: negative for - hives or itchy/watery eyes Hematological and Lymphatic ROS: negative for - bleeding problems, bruising or swollen lymph nodes Endocrine ROS: negative for - galactorrhea, hair pattern changes, polydipsia/polyuria or temperature intolerance Respiratory ROS: negative for - cough, hemoptysis, shortness of breath or wheezing Cardiovascular ROS: chest pain Gastrointestinal ROS: negative for - abdominal pain, diarrhea, hematemesis, nausea/vomiting or stool incontinence Genito-Urinary ROS: negative for - dysuria, hematuria, incontinence or urinary frequency/urgency Musculoskeletal ROS: negative for - joint swelling or muscular weakness Neurological ROS: as noted in HPI Dermatological ROS: negative for rash and skin lesion changes  Physical Examination: Blood pressure 119/85, pulse 82, temperature 97.9 F (36.6 C), temperature source Oral, resp. rate 28, height 4' 10" (1.473 m), weight 67.677 kg (149 lb 3.2 oz), SpO2 92.00%.  Neurologic Examination: Mental Status: Alert, oriented, thought content appropriate.  Speech fluent without evidence of aphasia.  Able to follow 3 step commands without difficulty. Cranial Nerves: II: Discs flat bilaterally; Visual fields grossly normal in the right eye.  In the left eye   the patient with decreased vision throughout but clear worsening altitudinally, pupils equal, round, reactive to light and accommodation III,IV, VI: ptosis not present, extra-ocular motions intact bilaterally V,VII: smile  symmetric, facial light touch sensation normal bilaterally VIII: hearing normal bilaterally IX,X: gag reflex present XI: bilateral shoulder shrug XII: midline tongue extension Motor: Right : Upper extremity   5/5    Left:     Upper extremity   5/5  Lower extremity   5/5     Lower extremity   5/5 Tone and bulk:normal tone throughout; no atrophy noted Sensory: Pinprick and light touch intact throughout, bilaterally Deep Tendon Reflexes: 2+ and symmetric with absent AJ's bilaterally Plantars: Right: downgoing   Left: downgoing Cerebellar: normal finger-to-nose and normal heel-to-shin test Gait: Unable to test CV: pulses palpable throughout     Laboratory Studies:  Basic Metabolic Panel:  Recent Labs Lab 07/13/13 2056 07/14/13 0051  NA 142 139  K 3.2* 3.4*  CL 100 96  CO2  --  27  GLUCOSE 116* 96  BUN 15 18  CREATININE 0.90 0.79  CALCIUM  --  10.1    Liver Function Tests:  Recent Labs Lab 07/14/13 0051  AST 15  ALT 11  ALKPHOS 70  BILITOT <0.2*  PROT 7.7  ALBUMIN 3.9   No results found for this basename: LIPASE, AMYLASE,  in the last 168 hours No results found for this basename: AMMONIA,  in the last 168 hours  CBC:  Recent Labs Lab 07/13/13 2020 07/13/13 2056  WBC 8.8  --   NEUTROABS 5.5  --   HGB 13.6 13.9  HCT 38.6 41.0  MCV 89.4  --   PLT 346  --     Cardiac Enzymes: No results found for this basename: CKTOTAL, CKMB, CKMBINDEX, TROPONINI,  in the last 168 hours  BNP: No components found with this basename: POCBNP,   CBG: No results found for this basename: GLUCAP,  in the last 168 hours  Microbiology: No results found for this or any previous visit.  Coagulation Studies:  Recent Labs  07/14/13 0051  LABPROT 11.9  INR 0.89    Urinalysis:  Recent Labs Lab 07/14/13 0058  COLORURINE YELLOW  LABSPEC 1.011  PHURINE 6.5  GLUCOSEU NEGATIVE  HGBUR NEGATIVE  BILIRUBINUR NEGATIVE  KETONESUR NEGATIVE  PROTEINUR NEGATIVE   UROBILINOGEN 0.2  NITRITE NEGATIVE  LEUKOCYTESUR NEGATIVE    Lipid Panel: No results found for this basename: chol, trig, hdl, cholhdl, vldl, ldlcalc    HgbA1C:  No results found for this basename: HGBA1C    Urine Drug Screen:     Component Value Date/Time   LABOPIA NONE DETECTED 07/14/2013 0058   COCAINSCRNUR NONE DETECTED 07/14/2013 0058   LABBENZ NONE DETECTED 07/14/2013 0058   AMPHETMU NONE DETECTED 07/14/2013 0058   THCU NONE DETECTED 07/14/2013 0058   LABBARB NONE DETECTED 07/14/2013 0058    Alcohol Level:  Recent Labs Lab 07/14/13 0051  ETH <11    Other results: EKG: sinus rhythm at 90 bpm.  Imaging: Ct Head Wo Contrast  07/13/2013   CLINICAL DATA:  Vision changes, eye pain.  EXAM: CT HEAD WITHOUT CONTRAST  TECHNIQUE: Contiguous axial images were obtained from the base of the skull through the vertex without intravenous contrast.  COMPARISON:  None.  FINDINGS: There is no acute intracranial hemorrhage or infarct. No mass lesion or midline shift. Gray-white matter differentiation is well maintained. Ventricles are normal in size without evidence of hydrocephalus. CSF containing spaces are within normal  limits. No extra-axial fluid collection.  The calvarium is intact.  Orbital soft tissues are within normal limits.  There is near complete opacification of the right sphenoid sinus and right sphenoid ethmoidal recess. Otherwise, the paranasal sinuses and mastoid air cells are well pneumatized and free of fluid.  Scalp soft tissues are unremarkable.  IMPRESSION: 1. Normal head CT with no acute intracranial abnormality identified. 2. Right sphenoid sinus disease.   Electronically Signed   By: Benjamin  McClintock M.D.   On: 07/13/2013 20:51    Assessment: 52 y.o. female presenting with decreased vision in the right eye.  Suspect an ischemic etiology.  Head CT reviewed and shows no acute changes.  Patient with multiple vascular risk factors.  On no antiplatelet therapy at home.     Stroke Risk Factors - hyperlipidemia, hypertension, smoking and OSA  Plan: 1. HgbA1c, fasting lipid panel, CRP.  ESR 20.   2. MRI, MRA  of the brain without contrast 3. Echocardiogram 4. Carotid dopplers 5. Prophylactic therapy-Antiplatelet med: Aspirin - dose 325mg daily 6. Smoking cessation counseling 7. Telemetry monitoring 8. Frequent neuro checks  Case discussed with Dr. Wickline  Leslie Reynolds, MD [facility] [phone] 07/14/2013, 3:27 AM      

## 2013-07-14 NOTE — Progress Notes (Signed)
Received call from RN stating that pt. Had an order for CPAP. Pt. Told RN that the oxygen would be good for tonight when asked if she wanted to wear CPAP.

## 2013-07-15 LAB — CBC
HCT: 39 % (ref 36.0–46.0)
HEMOGLOBIN: 13.4 g/dL (ref 12.0–15.0)
MCH: 30.7 pg (ref 26.0–34.0)
MCHC: 34.4 g/dL (ref 30.0–36.0)
MCV: 89.4 fL (ref 78.0–100.0)
Platelets: 307 10*3/uL (ref 150–400)
RBC: 4.36 MIL/uL (ref 3.87–5.11)
RDW: 12.9 % (ref 11.5–15.5)
WBC: 7.7 10*3/uL (ref 4.0–10.5)

## 2013-07-15 LAB — BASIC METABOLIC PANEL
BUN: 22 mg/dL (ref 6–23)
CO2: 24 mEq/L (ref 19–32)
Calcium: 10.1 mg/dL (ref 8.4–10.5)
Chloride: 99 mEq/L (ref 96–112)
Creatinine, Ser: 0.75 mg/dL (ref 0.50–1.10)
Glucose, Bld: 113 mg/dL — ABNORMAL HIGH (ref 70–99)
POTASSIUM: 3.4 meq/L — AB (ref 3.7–5.3)
SODIUM: 140 meq/L (ref 137–147)

## 2013-07-15 MED ORDER — ACETAMINOPHEN 325 MG PO TABS: 650.0000 mg | ORAL_TABLET | Freq: Four times a day (QID) | ORAL | Status: DC | PRN

## 2013-07-15 MED ORDER — IBUPROFEN 800 MG PO TABS
800.0000 mg | ORAL_TABLET | Freq: Once | ORAL | Status: AC
  Administered 2013-07-15: 800 mg via ORAL
  Filled 2013-07-15: qty 1

## 2013-07-15 MED ORDER — POTASSIUM CHLORIDE ER 10 MEQ PO TBCR: 10.0000 meq | EXTENDED_RELEASE_TABLET | Freq: Every day | ORAL | Status: DC

## 2013-07-15 MED ORDER — TRAMADOL HCL 50 MG PO TABS
50.0000 mg | ORAL_TABLET | Freq: Four times a day (QID) | ORAL | Status: DC | PRN
  Administered 2013-07-15: 50 mg via ORAL
  Filled 2013-07-15: qty 1

## 2013-07-15 MED ORDER — ASPIRIN EC 325 MG PO TBEC: 325.0000 mg | DELAYED_RELEASE_TABLET | Freq: Every day | ORAL | Status: AC

## 2013-07-15 MED ORDER — ATORVASTATIN CALCIUM 20 MG PO TABS: 20.0000 mg | ORAL_TABLET | Freq: Every day | ORAL | Status: DC

## 2013-07-15 MED ORDER — IBUPROFEN 600 MG PO TABS
600.0000 mg | ORAL_TABLET | Freq: Three times a day (TID) | ORAL | Status: DC | PRN
  Filled 2013-07-15: qty 1

## 2013-07-15 NOTE — Evaluation (Signed)
Physical Therapy Evaluation/Discharge Patient Details Name: Stephanie Booker MRN: [id number] DOB: 04/20/1960 Today's Date: 07/15/2013   History of Present Illness  53 y.o. female [facility] with left eye vision loss.  [facility] doctor who had concerns about this being ischemic origin.  Stroke workup in progress.  CT and MRI were negative for acute infarct.  Pt with significant PMHx of HTN, hyperlipidemia, and right knee arthritis.   Clinical Impression  Pt is mobilizing independently and her vision does not seem to interfere with her balance or gait safety.  She has no further acute PT needs at this time.  PT to sign off.      Follow Up Recommendations No PT follow up    Equipment Recommendations  None recommended by PT    Recommendations for Other Services   NA    Precautions / Restrictions Precautions Precautions: None      Mobility  Bed Mobility Overal bed mobility: Independent                Transfers Overall transfer level: Independent                  Ambulation/Gait Ambulation/Gait assistance: Independent Ambulation Distance (Feet): 250 Feet Assistive device: None       General Gait Details: despite left eye deficits, pt did not seem to have issues navigating obstacles on this side and is compensating well during dynamic gait.       Modified Rankin (Stroke Patients Only) Modified Rankin (Stroke Patients Only) Pre-Morbid Rankin Score: No symptoms Modified Rankin: No significant disability     Balance Overall balance assessment: Independent                               Standardized Balance Assessment Standardized Balance Assessment : Dynamic Gait Index   Dynamic Gait Index Level Surface: Normal Change in Gait Speed: Normal Gait with Horizontal Head Turns: Normal Gait with Vertical Head Turns: Normal Gait and Pivot Turn: Normal Step Over Obstacle: Normal Step Around Obstacles:  Normal Steps:  (not tested)       Pertinent Vitals/Pain See vitals flow sheet.     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     Prior Function Level of Independence: Independent         Comments: Pt reports that she drives, works as a LawyerCNA, wears bifocals all the time.      Hand Dominance   Dominant Hand: Right    Extremity/Trunk Assessment   Upper Extremity Assessment: Overall WFL for tasks assessed           Lower Extremity Assessment: Overall WFL for tasks assessed      Cervical / Trunk Assessment: Normal  Communication   Communication: No difficulties  Cognition Arousal/Alertness: Awake/alert Behavior During Therapy: WFL for tasks assessed/performed Overall Cognitive Status: Within Functional Limits for tasks assessed                               Assessment/Plan    PT Assessment Patent  does not need any further PT services           PT Goals (Current goals can be found in the Care Plan section) Acute Rehab PT Goals PT Goal Formulation: No goals set, d/c therapy               End of Session   Activity Tolerance: Patient tolerated treatment well Patient left: in bed;with call bell/phone within reach;with family/visitor present Nurse Communication: Mobility status;Other (comment) (pt ok to ambulate on her own in the room)         Time: 1141-1200 PT Time Calculation (min): 19 min   Charges:   PT Evaluation $Initial PT Evaluation Tier I: 1 Procedure          Rebecca B. Medendorp, PT, DPT [phone]   07/15/2013, 2:22 PM

## 2013-07-15 NOTE — Care Management Note (Signed)
    Page 1 of 1   07/15/2013     2:12:16 PM CARE MANAGEMENT NOTE 07/15/2013  Patient:  Stephanie Booker,Stephanie Booker   Account Number:  [id number]  Date Initiated:  07/15/2013  Documentation initiated by:  Elmer BalesOBARGE,COURTNEY  Subjective/Objective Assessment:   Patient admitted with left eye vision loss. Lives at home with  spouse.     Action/Plan:   Will follow for discharge needs pending PT/OT evals and physician orders.   Anticipated DC Date:  07/15/2013   Anticipated DC Plan:  HOME/SELF CARE         Choice offered to / List presented to:             Status of service:  Completed, signed off Medicare Important Message given?   (If response is "NO", the following Medicare IM given date fields will be blank) Date Medicare IM given:   Date Additional Medicare IM given:    Discharge Disposition:  HOME/SELF CARE  Per UR Regulation:  Reviewed for med. necessity/level of care/duration of stay  If discussed at Long Length of Stay Meetings, dates discussed:    Comments:

## 2013-07-15 NOTE — Progress Notes (Signed)
Stroke Team Progress Note  HISTORY Chief Complaint: Decreased vision left eye  HPI: Stephanie Booker is an 53 y.o. female who reports that on Wednesday of last week she began to have chest pain. She had no visual complaints at that time. She went to bed and on awakening on Thursday she had difficulty with vision in the left eye. Although vision throughout in the left eye was impaired she had particular difficulty looking upward. The patient was not able to get in touch with her eye doctor until today. After evaluation the patient was directed to present to the ED for further evaluation. The patient reports no additional neurological complaints.  Date last known well: Date: 07/09/2013  Time last known well: Time: 00:00  tPA Given: No: Outside time window   Patient was not administered TPA secondary to outside time window.  Patient was admitted to the Internal Medicine for further evaluation and treatment. Neurology is consulting.   SUBJECTIVE No acute events overnight. Husband is at the bedside. The patient is alert and conversant, and denies complaints.   OBJECTIVE Most recent Vital Signs: Filed Vitals:   07/14/13 2119 07/14/13 2124 07/15/13 0114 07/15/13 0533  BP:  93/82 120/87 107/67  Pulse: 82 66 77 87  Temp:  97.9 F (36.6 C) 97.8 F (36.6 C) 97.6 F (36.4 C)  TempSrc:  Oral Oral Oral  Resp: 18 18 18 18   Height:      Weight:      SpO2: 95% 98% 98% 94%   CBG (last 3)  No results found for this basename: GLUCAP,  in the last 72 hours  IV Fluid Intake:     MEDICATIONS  . atorvastatin  20 mg Oral q1800  . cetirizine  10 mg Oral Daily  . desvenlafaxine  100 mg Oral Daily  . fenofibrate  160 mg Oral Daily  . heparin  5,000 Units Subcutaneous 3 times per day  . hydrochlorothiazide  25 mg Oral Daily   PRN:  acetaminophen, traMADol  Diet:  General thin liquids Activity:  As tolerated DVT Prophylaxis:  SQ heparin  CLINICALLY SIGNIFICANT STUDIES Basic Metabolic Panel:    Recent Labs Lab 07/14/13 0051 07/15/13 0615  NA 139 140  K 3.4* 3.4*  CL 96 99  CO2 27 24  GLUCOSE 96 113*  BUN 18 22  CREATININE 0.79 0.75  CALCIUM 10.1 10.1   Liver Function Tests:   Recent Labs Lab 07/14/13 0051  AST 15  ALT 11  ALKPHOS 70  BILITOT <0.2*  PROT 7.7  ALBUMIN 3.9   CBC:   Recent Labs Lab 07/13/13 2020 07/13/13 2056 07/15/13 0615  WBC 8.8  --  7.7  NEUTROABS 5.5  --   --   HGB 13.6 13.9 13.4  HCT 38.6 41.0 39.0  MCV 89.4  --  89.4  PLT 346  --  307   Coagulation:   Recent Labs Lab 07/14/13 0051  LABPROT 11.9  INR 0.89   Cardiac Enzymes: No results found for this basename: CKTOTAL, CKMB, CKMBINDEX, TROPONINI,  in the last 168 hours Urinalysis:   Recent Labs Lab 07/14/13 0058  COLORURINE YELLOW  LABSPEC 1.011  PHURINE 6.5  GLUCOSEU NEGATIVE  HGBUR NEGATIVE  BILIRUBINUR NEGATIVE  KETONESUR NEGATIVE  PROTEINUR NEGATIVE  UROBILINOGEN 0.2  NITRITE NEGATIVE  LEUKOCYTESUR NEGATIVE   Lipid Panel    Component Value Date/Time   CHOL 212* 07/14/2013 0043   TRIG 531* 07/14/2013 0043   HDL 29* 07/14/2013 0043   CHOLHDL  7.3 07/14/2013 0043   VLDL UNABLE TO CALCULATE IF TRIGLYCERIDE OVER 400 mg/dL 8/11/91476/23/2015 82950043   LDLCALC UNABLE TO CALCULATE IF TRIGLYCERIDE OVER 400 mg/dL 6/21/30866/23/2015 57840043   ONGE9BHgbA1C  Lab Results  Component Value Date   HGBA1C 5.3 07/14/2013    Urine Drug Screen:     Component Value Date/Time   LABOPIA NONE DETECTED 07/14/2013 0058   COCAINSCRNUR NONE DETECTED 07/14/2013 0058   LABBENZ NONE DETECTED 07/14/2013 0058   AMPHETMU NONE DETECTED 07/14/2013 0058   THCU NONE DETECTED 07/14/2013 0058   LABBARB NONE DETECTED 07/14/2013 0058    Alcohol Level:   Recent Labs Lab 07/14/13 0051  ETH <11    Dg Chest 2 View  07/14/2013   CLINICAL DATA:  Hypertension.  Stroke.  EXAM: CHEST  2 VIEW  COMPARISON:  None.  FINDINGS: Mediastinum and hilar structures are normal. Lungs are clear. Heart size normal. No pleural effusion or  pneumothorax. Degenerative changes thoracic spine.  IMPRESSION: No active cardiopulmonary disease.   Electronically Signed   By: Maisie Fushomas  Register   On: 07/14/2013 09:32   Ct Head Wo Contrast  07/13/2013   CLINICAL DATA:  Vision changes, eye pain.  EXAM: CT HEAD WITHOUT CONTRAST  TECHNIQUE: Contiguous axial images were obtained from the base of the skull through the vertex without intravenous contrast.  COMPARISON:  None.  FINDINGS: There is no acute intracranial hemorrhage or infarct. No mass lesion or midline shift. Gray-white matter differentiation is well maintained. Ventricles are normal in size without evidence of hydrocephalus. CSF containing spaces are within normal limits. No extra-axial fluid collection.  The calvarium is intact.  Orbital soft tissues are within normal limits.  There is near complete opacification of the right sphenoid sinus and right sphenoid ethmoidal recess. Otherwise, the paranasal sinuses and mastoid air cells are well pneumatized and free of fluid.  Scalp soft tissues are unremarkable.  IMPRESSION: 1. Normal head CT with no acute intracranial abnormality identified. 2. Right sphenoid sinus disease.   Electronically Signed   By: Rise MuBenjamin  McClintock M.D.   On: 07/13/2013 20:51   Mr Brain Wo Contrast  07/14/2013   CLINICAL DATA:  Left-sided visual changes.  Stroke.  EXAM: MRI HEAD WITHOUT CONTRAST  TECHNIQUE: Multiplanar, multiecho pulse sequences of the brain and surrounding structures were obtained without intravenous contrast.  COMPARISON:  Head CT 07/13/2013  FINDINGS: The examination had to be discontinued prior to completion due to severe claustrophobia. Despite premedication and repeated imaging, only axial and coronal diffusion weighted images were obtained. MRA was attempted multiple times but diagnostic images could not be obtained. The examination was terminated at that point.  Diffusion weighted images are mildly degraded by motion. There is some abnormal diffusion  weighted signal in the anterolateral left occipital lobe on coronal images, however no abnormality is identified in this region on the axial diffusion-weighted images.  Cerebral volume appears within normal limits for age without midline shift or gross mass or extra-axial fluid collection.  IMPRESSION: Incomplete examination as above. No definite acute infarct identified. Abnormal diffusion weighted signal in the left occipital lobe is not confirmed on axial images and may be artifactual.   Electronically Signed   By: Sebastian AcheAllen  Grady   On: 07/14/2013 19:42    CT of the brain   IMPRESSION:  1. Normal head CT with no acute intracranial abnormality identified.  2. Right sphenoid sinus disease.    2D Echocardiogram   Study Conclusions  - Left ventricle: The cavity size was normal.  Systolic function was normal. The estimated ejection fraction was in the range of 55% to 60%. Wall motion was normal; there were no regional wall motion abnormalities. - Atrial septum: No defect or patent foramen ovale was identified.    Carotid Doppler  Prelim: <39% stenosis bilat  CXR   IMPRESSION:  No active cardiopulmonary disease  EKG .  Sinus rhythm Borderline T abnormalities, anterior leads Baseline wander in lead(s) V3 V4 V5 V6  Therapy Recommendations no needs identified  Physical Exam  General: The patient is alert and cooperative at the time of the examination.  Skin: No significant peripheral edema is noted.   Neurologic Exam  Mental status: The patient is alert and oriented x 4.  Speech: No dysarthria or dysphonia. Patient able to name and repeat.   Cranial nerves: Facial symmetry is present. Extraocular movements are full. Visual fields are full with the right eye, the patient has an altitudinal defect, superior with the left eye.  Motor: The patient has good strength in all 4 extremities.  Sensory examination: Soft touch sensation is symmetric on the face, arms, and  legs.  Coordination: The patient has good finger-nose-finger and heel-to-shin bilaterally.  Gait and station: The gait was not tested. No drift is seen with the arms.  Reflexes: Deep tendon reflexes are symmetric.   ASSESSMENT Stephanie Booker is a 53 y.o. female  Stroke work up underway. The patient was not a TPA candidate secondary to delay in presentation, minimal deficit. The patient was not on antiplatelet agents prior to admission. She has significant hypertriglyceridemia, and tobacco use in combination with estrogens. The patient presented with visual disturbance involving the left eye only.   Diabetes  Hypertriglyceridemia, dyslipidemia  Obesity  Tobacco use  Estrogen use  Hypertension  Anxiety disorder  Obstructive sleep apnea   Hospital day # 2  MRI was limited, but diffusion-weighted images suggested no acute stroke. CT the head was unremarkable. I am not clear that the patient needs a full MRI evaluation. Sedimentation rate was 20. Okay for discharge to home today.  TREATMENT/PLAN  -Carotid Doppler prelim <39% stenosis bilaterally -Aspirin therapy -Smoking cessation -Cessation of estrogen -Sedimentation rate 20 -LDL could not be calculated secondary to hypertriglyceridemia, on atorvastatin 20 mg daily -Can followup with [facility] in 6-8 weeks.   SIGNED Stephani Policeeirdre Fraller, MSN, ANP-C, CNRN, MSCS [facility] St[facility] Team [phone]  I have personally obtained a history, examined the patient, evaluated imaging results, and formulated the assessment and plan of care. I agree with the above.  Lesly DukesWILLIS,Heavyn Yearsley KEITH   To contact Stroke Continuity provider, please refer to [email or web address]. After hours, contact General Neurology

## 2013-07-15 NOTE — Progress Notes (Signed)
UR complete.  Courtney Robarge RN, MSN 

## 2013-07-15 NOTE — Discharge Summary (Addendum)
Physician Discharge Summary  Stephanie Booker A Stephanie Booker:[id number] DOB: 06/17/1960 DOA: 07/13/2013  PCP: No primary provider on file.  Admit date: 07/13/2013 Discharge date: 07/15/2013  Time spent: >35 minutes  Recommendations for Outpatient Follow-up:  F/u with neurologist in 3-4 week s F/u with PCP in 1-2 week s F/u with ophthalmologist in 1 week  Discharge Diagnoses:  Principal Problem:   Vision loss, left eye Active Problems:   Stroke   Discharge Condition: stable   Diet recommendation: low sodium  Filed Weights   07/13/13 2008  Weight: 67.677 kg (149 lb 3.2 oz)    History of present illness:   53 y.o. female presented with difficulty with vision in her left eye, loss of upward vision and peripheral vision, does have some downward peripheral vision in the eye. She was not able to get in touch with her eye doctor until admission day. After evaluation patient was directed to present to the ED for further evaluation with concern for ischemic etiology.  1. Visual disturbance involving the left eye; Pt was not TPA candidate delay on presentation; initially suspected CVA, but MRI neg, although limited study  - MRI was limited, but diffusion-weighted images suggested no acute stroke. CT the head was unremarkable. -Carotid Doppler prelim <39% stenosis bilaterally; echo no clear thrombus  -started aspirin therapy, Smoking cessation, d/c estrogen, added atorvastatin -per neurology okay for d/c followup with [facility] in 6-8 weeks.  2. HTN, stable; added potassium with diuretic -recommended to recheck BMP in 5 days   Procedures:  MRI (i.e. Studies not automatically included, echos, thoracentesis, etc; not x-rays)  Consultations:  Neurology   Discharge Exam: Filed Vitals:   07/15/13 1029  BP: 108/51  Pulse: 64  Temp: 97.4 F (36.3 C)  Resp: 16    General: alert Cardiovascular: s1,s2 rrr Respiratory: CTA BL  Discharge Instructions  Discharge Instructions   Diet - low sodium  heart healthy    Complete by:  As directed      Discharge instructions    Complete by:  As directed   Please follow up with neurologist in 3-4 weeks  Please follow up with ophthalmologist in 1 week  Please follow up with primary care doctor in 1-2 weeks     Increase activity slowly    Complete by:  As directed             Medication List    STOP taking these medications       estradiol 1 MG tablet  Commonly known as:  ESTRACE      TAKE these medications       acetaminophen 325 MG tablet  Commonly known as:  TYLENOL  Take 2 tablets (650 mg total) by mouth every 6 (six) hours as needed for mild pain.     aspirin EC 325 MG tablet  Take 1 tablet (325 mg total) by mouth daily.     atorvastatin 20 MG tablet  Commonly known as:  LIPITOR  Take 1 tablet (20 mg total) by mouth daily at 6 PM.     desvenlafaxine 100 MG 24 hr tablet  Commonly known as:  PRISTIQ  Take 100 mg by mouth daily.     fenofibrate 160 MG tablet  Take 160 mg by mouth daily.     hydrochlorothiazide 25 MG tablet  Commonly known as:  HYDRODIURIL  Take 25 mg by mouth daily.     ibuprofen 200 MG tablet  Commonly known as:  ADVIL,MOTRIN  Take 400 mg by mouth  every 6 (six) hours as needed for headache (pain).     levocetirizine 5 MG tablet  Commonly known as:  XYZAL  Take 5 mg by mouth every evening.     potassium chloride 10 MEQ tablet  Commonly known as:  K-DUR  Take 1 tablet (10 mEq total) by mouth daily.       No Known Allergies     Follow-up Information   Follow up with Gates RiggSETHI,PRAMODKUMAR P, MD In 1 month.   Specialties:  Neurology, Radiology   Contact information:   [address] [place] [address] [phone]       Follow up with [facility]     In 1 week.   Contact information:   [address] [place] [address] [phone]       The results of significant diagnostics from this hospitalization (including imaging,  microbiology, ancillary and laboratory) are listed below for reference.    Significant Diagnostic Studies: Dg Chest 2 View  07/14/2013   CLINICAL DATA:  Hypertension.  Stroke.  EXAM: CHEST  2 VIEW  COMPARISON:  None.  FINDINGS: Mediastinum and hilar structures are normal. Lungs are clear. Heart size normal. No pleural effusion or pneumothorax. Degenerative changes thoracic spine.  IMPRESSION: No active cardiopulmonary disease.   Electronically Signed   By: Stephanie Booker  Register   On: 07/14/2013 09:32   Ct Head Wo Contrast  07/13/2013   CLINICAL DATA:  Vision changes, eye pain.  EXAM: CT HEAD WITHOUT CONTRAST  TECHNIQUE: Contiguous axial images were obtained from the base of the skull through the vertex without intravenous contrast.  COMPARISON:  None.  FINDINGS: There is no acute intracranial hemorrhage or infarct. No mass lesion or midline shift. Gray-white matter differentiation is well maintained. Ventricles are normal in size without evidence of hydrocephalus. CSF containing spaces are within normal limits. No extra-axial fluid collection.  The calvarium is intact.  Orbital soft tissues are within normal limits.  There is near complete opacification of the right sphenoid sinus and right sphenoid ethmoidal recess. Otherwise, the paranasal sinuses and mastoid air cells are well pneumatized and free of fluid.  Scalp soft tissues are unremarkable.  IMPRESSION: 1. Normal head CT with no acute intracranial abnormality identified. 2. Right sphenoid sinus disease.   Electronically Signed   By: Stephanie Booker  McClintock M.D.   On: 07/13/2013 20:51   Mr Brain Wo Contrast  07/14/2013   CLINICAL DATA:  Left-sided visual changes.  Stroke.  EXAM: MRI HEAD WITHOUT CONTRAST  TECHNIQUE: Multiplanar, multiecho pulse sequences of the brain and surrounding structures were obtained without intravenous contrast.  COMPARISON:  Head CT 07/13/2013  FINDINGS: The examination had to be discontinued prior to completion due to severe  claustrophobia. Despite premedication and repeated imaging, only axial and coronal diffusion weighted images were obtained. MRA was attempted multiple times but diagnostic images could not be obtained. The examination was terminated at that point.  Diffusion weighted images are mildly degraded by motion. There is some abnormal diffusion weighted signal in the anterolateral left occipital lobe on coronal images, however no abnormality is identified in this region on the axial diffusion-weighted images.  Cerebral volume appears within normal limits for age without midline shift or gross mass or extra-axial fluid collection.  IMPRESSION: Incomplete examination as above. No definite acute infarct identified. Abnormal diffusion weighted signal in the left occipital lobe is not confirmed on axial images and may be artifactual.   Electronically Signed   By:  Sebastian Ache   On: 07/14/2013 19:42    Microbiology: No results found for this or any previous visit (from the past 240 hour(s)).   Labs: Basic Metabolic Panel:  Recent Labs Lab 07/13/13 2056 07/14/13 0051 07/15/13 0615  NA 142 139 140  K 3.2* 3.4* 3.4*  CL 100 96 99  CO2  --  27 24  GLUCOSE 116* 96 113*  BUN 15 18 22   CREATININE 0.90 0.79 0.75  CALCIUM  --  10.1 10.1   Liver Function Tests:  Recent Labs Lab 07/14/13 0051  AST 15  ALT 11  ALKPHOS 70  BILITOT <0.2*  PROT 7.7  ALBUMIN 3.9   No results found for this basename: LIPASE, AMYLASE,  in the last 168 hours No results found for this basename: AMMONIA,  in the last 168 hours CBC:  Recent Labs Lab 07/13/13 2020 07/13/13 2056 07/15/13 0615  WBC 8.8  --  7.7  NEUTROABS 5.5  --   --   HGB 13.6 13.9 13.4  HCT 38.6 41.0 39.0  MCV 89.4  --  89.4  PLT 346  --  307   Cardiac Enzymes: No results found for this basename: CKTOTAL, CKMB, CKMBINDEX, TROPONINI,  in the last 168 hours BNP: BNP (last 3 results) No results found for this basename: PROBNP,  in the last 8760  hours CBG: No results found for this basename: GLUCAP,  in the last 168 hours     Signed:  Esperanza Sheets  [facility] 07/15/2013, 12:04 PM

## 2013-07-15 NOTE — Progress Notes (Signed)
Occupational Therapy Evaluation and Discharge  Patient Details Name: Stephanie Booker A Deziel MRN: [id number] DOB: 01/04/1961 Today's Date: 07/15/2013    History of Present Illness 53 y.o. female [facility] with left eye vision loss.  [facility] eye doctor who had concerns about this being ischemic origin.  Stroke workup in progress.  CT and MRI were negative for acute infarct.  Pt with significant PMHx of HTN, hyperlipidemia, and right knee arthritis.    Clinical Impression   PTA pt was independent with ADLs and functional mobility. 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 Pt presents with loss of vision in her L eye, specifically in the superior field from nasal to peripheral. In inferior field, pt reports vision as mostly clear when wearing glasses. Educated pt on safety during ADLs and following up with ophthalmologist and neurology as recommended by MD. No further acute OT needs.     Follow Up Recommendations  Outpatient OT (Neuro);Supervision - Intermittent    Equipment Recommendations  None recommended by OT       Precautions / Restrictions Precautions Precautions: None      Mobility Bed Mobility Overal bed mobility: Independent                Transfers Overall transfer level: Independent                    Balance Overall balance assessment: Independent                                      ADL Overall ADL's : Independent                                       General ADL Comments: Educated pt on safety with vision during ADLs and functional mobility. Discussed strategies to decrease eye strain and headache, including taking breaks from computer screen and taking time with eyes closed, which pt reports to relieve symptoms.      Vision Eye Alignment: Within Functional Limits Alignment/Gaze Preference: Within Defined Limits Ocular Range of Motion: Within Functional  Limits Tracking/Visual Pursuits: Able to track stimulus in all quads without difficulty Saccades: Within functional limits       Additional Comments: Pt presents with vision loss from L eye in superior field from nasal to peripheral with R eye closed. With both eyes open, pt has full fields of vision.    Perception Perception Perception Tested?: No   Praxis Praxis Praxis tested?: Within functional limits    Pertinent Vitals/Pain Pt reports mild HA and eye strain. Pt reports this improves with eyes closed.      Hand Dominance Right   Extremity/Trunk Assessment Upper Extremity Assessment Upper Extremity Assessment: Overall WFL for tasks assessed   Lower Extremity Assessment Lower Extremity Assessment: Overall WFL for tasks assessed   Cervical / Trunk Assessment Cervical / Trunk Assessment: Normal   Communication Communication Communication: No difficulties   Cognition Arousal/Alertness: Awake/alert Behavior During Therapy: WFL for tasks assessed/performed Overall Cognitive Status: Within Functional Limits for tasks assessed                                Home Living Family/patient expects to be discharged to::  Private residence Living Arrangements: Spouse/significant other;Parent (mother lives with pt) Available Help at Discharge: Family;Available PRN/intermittently Type of Home: House Home Access: Level entry     Home Layout: Laundry or work area in basement     Foot LockerBathroom Shower/Tub: Tub/shower unit Shower/tub characteristics: Curtain       Home Equipment: Cane - single point;Shower seat   Additional Comments: 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       Prior Functioning/Environment Level of Independence: Independent        Comments: Pt reports that she drives, works as a LawyerCNA, wears bifocals all the time.                               End of Session  Activity Tolerance: Patient tolerated treatment well Patient left: in bed;with  call bell/phone within reach;with family/visitor present   Time: 1350-1405 OT Time Calculation (min): 15 min Charges:  OT General Charges $OT Visit: 1 Procedure OT Evaluation $Initial OT Evaluation Tier I: 1 Procedure  Rae LipsMiller, LeeAnn M [phone] 07/15/2013, 2:14 PM

## 2013-07-17 ENCOUNTER — Telehealth: Payer: Self-pay | Admitting: *Deleted

## 2013-07-17 NOTE — Telephone Encounter (Signed)
Called and left VM message for call back

## 2013-07-20 ENCOUNTER — Encounter: Payer: Self-pay | Admitting: Neurology

## 2013-07-20 NOTE — Telephone Encounter (Signed)
I called patient. The patient had retinal ischemia and partial visual loss in the left eye. This was not a central retinal artery occlusion. Workup was unremarkable, no stroke occurred. 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 She is to return to work by 07/27/2013.

## 2013-07-20 NOTE — Telephone Encounter (Signed)
Spoke with sister(Kathy) and informed that she is not on patient's hippa and could not discuss patient but to have patient to contact our office concerning letter, she verbalized understanding. Called patient and left VM to contact office the letter request

## 2013-07-20 NOTE — Telephone Encounter (Signed)
Patient calling back.  States she was to return to work today but there is no way she can return today.  Will this office be able to provide her with a letter?  Please call. [phone]

## 2013-07-20 NOTE — Telephone Encounter (Signed)
done

## 2013-07-20 NOTE — Telephone Encounter (Signed)
Spoke to patient and she would like to know if the doctor can write a letter to keep her out of work another week.  She was seen in the hospital 07-13-13-07-15-13 by stroke team.  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

## 2013-07-20 NOTE — Telephone Encounter (Signed)
[facility]

## 2013-08-19 ENCOUNTER — Encounter: Payer: Self-pay | Admitting: Neurology

## 2013-08-19 ENCOUNTER — Telehealth: Payer: Self-pay | Admitting: Neurology

## 2013-08-19 ENCOUNTER — Ambulatory Visit (INDEPENDENT_AMBULATORY_CARE_PROVIDER_SITE_OTHER): Payer: BC Managed Care – PPO | Admitting: Neurology

## 2013-08-19 ENCOUNTER — Encounter: Payer: Self-pay | Admitting: *Deleted

## 2013-08-19 VITALS — BP 114/78 | HR 79 | Ht <= 58 in | Wt 142.0 lb

## 2013-08-19 DIAGNOSIS — H5460 Unqualified visual loss, one eye, unspecified: Secondary | ICD-10-CM

## 2013-08-19 DIAGNOSIS — H47019 Ischemic optic neuropathy, unspecified eye: Secondary | ICD-10-CM

## 2013-08-19 DIAGNOSIS — H47012 Ischemic optic neuropathy, left eye: Secondary | ICD-10-CM | POA: Insufficient documentation

## 2013-08-19 DIAGNOSIS — H5462 Unqualified visual loss, left eye, normal vision right eye: Secondary | ICD-10-CM

## 2013-08-19 MED ORDER — ATORVASTATIN CALCIUM 20 MG PO TABS: 20.0000 mg | ORAL_TABLET | Freq: Every day | ORAL | Status: DC

## 2013-08-19 NOTE — Progress Notes (Signed)
[facility], [facility] [place](Dr Rosaland LaoSusan Crews) and patient was seen there in June, they will fax over OV notes. I  will fax them OV notes from our office at [phone]

## 2013-08-19 NOTE — Patient Instructions (Signed)
1. Continue ASA 325 and lipitor now for stroke prevention 2. You need to follow up with eye doctor for monitor visual field 3. Quit smoking 4. Follow up with PCP for stroke risk factor modification 5. Continue other home meds 6. Monitor BP at home 7. Follow  Up in clinic in 3 months.

## 2013-08-19 NOTE — Telephone Encounter (Signed)
Hi, Ammie:  I got the clinic note. Thank you so much. Can we scan this into pt's chart?  Marvel PlanJindong Treyana Sturgell, MD PhD

## 2013-08-19 NOTE — Progress Notes (Signed)
Faxed office notes to opthalmologist(Dr Rosaland LaoSusan Crews), could not to pcp because patient does not have one.

## 2013-08-19 NOTE — Progress Notes (Signed)
STROKE NEUROLOGY FOLLOW UP NOTE  NAME: Stephanie Booker DOB: May 26, 1960  REASON FOR VISIT: stroke follow up HISTORY FROM: pt and chart  Today we had the pleasure of seeing Stephanie Booker in follow-up at our Neurology Clinic. Pt was accompanied by husband.   History Summary Stephanie Booker is an 53 y.o. female with PMH of HTN, HLD, current smoker, estrogen use who was admitted on 6/22 for left side vision loss. She stated that on 6/21 at night she began to have chest pain, denies any SOB, HA, vision change, weakness or numbness. She went to bed and on awakening the second day she had difficulty with vision in the left eye. She stated that she can see from left eye the lower part and outer part of the visual field but not middle and upper parts. She went to see her PCP and was directed to present to the ED for further evaluation. Her right eye vision and visual field were intact. She had MRI brian which was negative for stroke although limited study and with artifact. Her CUS and 2D echo unremarkable. However, her TG and cholesterol were high. ESR was 20. She was discharged home with ASA and lipitor and fenofibrate.   Interval History During the interval time, the patient has been doing well. She stated that her visual field on the left eye improved some, she is able to see more on the upper visual field but still has central part of vision loss on the left eye. She went to see ophthalmologist last week and was told that her left eye swollen fundus, consistent with stroke.  She has cut down her smoking ana on chantix. She has stopped estrogen since the hospitalization.    REVIEW OF SYSTEMS: Full 14 system review of systems performed and notable only for those listed below and in HPI above, all others are negative:  Constitutional: N/A  Cardiovascular: N/A  Ear/Nose/Throat: N/A  Skin: N/A  Eyes: loss of vision Respiratory: N/A  Gastroitestinal: N/A  Genitourinary: N/A Hematology/Lymphatic:  N/A  Endocrine: N/A  Musculoskeletal: N/A  Allergy/Immunology: N/A  Neurological: N/A  Psychiatric: N/A  The following represents the patient's updated allergies and side effects list: No Known Allergies  Labs since last visit of relevance include the following: Results for orders placed during the hospital encounter of 07/13/13  CBC WITH DIFFERENTIAL      Result Value Ref Range   WBC 8.8  4.0 - 10.5 K/uL   RBC 4.32  3.87 - 5.11 MIL/uL   Hemoglobin 13.6  12.0 - 15.0 g/dL   HCT 38.6  36.0 - 46.0 %   MCV 89.4  78.0 - 100.0 fL   MCH 31.5  26.0 - 34.0 pg   MCHC 35.2  30.0 - 36.0 g/dL   RDW 13.0  11.5 - 15.5 %   Platelets 346  150 - 400 K/uL   Neutrophils Relative % 63  43 - 77 %   Neutro Abs 5.5  1.7 - 7.7 K/uL   Lymphocytes Relative 27  12 - 46 %   Lymphs Abs 2.4  0.7 - 4.0 K/uL   Monocytes Relative 9  3 - 12 %   Monocytes Absolute 0.8  0.1 - 1.0 K/uL   Eosinophils Relative 1  0 - 5 %   Eosinophils Absolute 0.1  0.0 - 0.7 K/uL   Basophils Relative 0  0 - 1 %   Basophils Absolute 0.0  0.0 - 0.1 K/uL  ETHANOL  Result Value Ref Range   Alcohol, Ethyl (B) <11  0 - 11 mg/dL  PROTIME-INR      Result Value Ref Range   Prothrombin Time 11.9  11.6 - 15.2 seconds   INR 0.89  0.00 - 1.49  APTT      Result Value Ref Range   aPTT 29  24 - 37 seconds  COMPREHENSIVE METABOLIC PANEL      Result Value Ref Range   Sodium 139  137 - 147 mEq/L   Potassium 3.4 (*) 3.7 - 5.3 mEq/L   Chloride 96  96 - 112 mEq/L   CO2 27  19 - 32 mEq/L   Glucose, Bld 96  70 - 99 mg/dL   BUN 18  6 - 23 mg/dL   Creatinine, Ser 0.79  0.50 - 1.10 mg/dL   Calcium 10.1  8.4 - 10.5 mg/dL   Total Protein 7.7  6.0 - 8.3 g/dL   Albumin 3.9  3.5 - 5.2 g/dL   AST 15  0 - 37 U/L   ALT 11  0 - 35 U/L   Alkaline Phosphatase 70  39 - 117 U/L   Total Bilirubin <0.2 (*) 0.3 - 1.2 mg/dL   GFR calc non Af Amer >90  >90 mL/min   GFR calc Af Amer >90  >90 mL/min  URINE RAPID DRUG SCREEN (HOSP PERFORMED)      Result  Value Ref Range   Opiates NONE DETECTED  NONE DETECTED   Cocaine NONE DETECTED  NONE DETECTED   Benzodiazepines NONE DETECTED  NONE DETECTED   Amphetamines NONE DETECTED  NONE DETECTED   Tetrahydrocannabinol NONE DETECTED  NONE DETECTED   Barbiturates NONE DETECTED  NONE DETECTED  URINALYSIS, ROUTINE W REFLEX MICROSCOPIC      Result Value Ref Range   Color, Urine YELLOW  YELLOW   APPearance CLEAR  CLEAR   Specific Gravity, Urine 1.011  1.005 - 1.030   pH 6.5  5.0 - 8.0   Glucose, UA NEGATIVE  NEGATIVE mg/dL   Hgb urine dipstick NEGATIVE  NEGATIVE   Bilirubin Urine NEGATIVE  NEGATIVE   Ketones, ur NEGATIVE  NEGATIVE mg/dL   Protein, ur NEGATIVE  NEGATIVE mg/dL   Urobilinogen, UA 0.2  0.0 - 1.0 mg/dL   Nitrite NEGATIVE  NEGATIVE   Leukocytes, UA NEGATIVE  NEGATIVE  SEDIMENTATION RATE      Result Value Ref Range   Sed Rate 20  0 - 22 mm/hr  C-REACTIVE PROTEIN      Result Value Ref Range   CRP 1.6 (*) <0.60 mg/dL  HEMOGLOBIN A1C      Result Value Ref Range   Hemoglobin A1C 5.3  <5.7 %   Mean Plasma Glucose 105  <117 mg/dL  LIPID PANEL      Result Value Ref Range   Cholesterol 212 (*) 0 - 200 mg/dL   Triglycerides 531 (*) <150 mg/dL   HDL 29 (*) >39 mg/dL   Total CHOL/HDL Ratio 7.3     VLDL UNABLE TO CALCULATE IF TRIGLYCERIDE OVER 400 mg/dL  0 - 40 mg/dL   LDL Cholesterol UNABLE TO CALCULATE IF TRIGLYCERIDE OVER 400 mg/dL  0 - 99 mg/dL  CBC      Result Value Ref Range   WBC 7.7  4.0 - 10.5 K/uL   RBC 4.36  3.87 - 5.11 MIL/uL   Hemoglobin 13.4  12.0 - 15.0 g/dL   HCT 39.0  36.0 - 46.0 %   MCV 89.4  78.0 - 100.0 fL   MCH 30.7  26.0 - 34.0 pg   MCHC 34.4  30.0 - 36.0 g/dL   RDW 12.9  11.5 - 15.5 %   Platelets 307  150 - 400 K/uL  BASIC METABOLIC PANEL      Result Value Ref Range   Sodium 140  137 - 147 mEq/L   Potassium 3.4 (*) 3.7 - 5.3 mEq/L   Chloride 99  96 - 112 mEq/L   CO2 24  19 - 32 mEq/L   Glucose, Bld 113 (*) 70 - 99 mg/dL   BUN 22  6 - 23 mg/dL    Creatinine, Ser 0.75  0.50 - 1.10 mg/dL   Calcium 10.1  8.4 - 10.5 mg/dL   GFR calc non Af Amer >90  >90 mL/min   GFR calc Af Amer >90  >90 mL/min  I-STAT CHEM 8, ED      Result Value Ref Range   Sodium 142  137 - 147 mEq/L   Potassium 3.2 (*) 3.7 - 5.3 mEq/L   Chloride 100  96 - 112 mEq/L   BUN 15  6 - 23 mg/dL   Creatinine, Ser 0.90  0.50 - 1.10 mg/dL   Glucose, Bld 116 (*) 70 - 99 mg/dL   Calcium, Ion 1.27 (*) 1.12 - 1.23 mmol/L   TCO2 28  0 - 100 mmol/L   Hemoglobin 13.9  12.0 - 15.0 g/dL   HCT 41.0  36.0 - 46.0 %  I-STAT TROPOININ, ED      Result Value Ref Range   Troponin i, poc 0.00  0.00 - 0.08 ng/mL   Comment 3             The neurologically relevant items on the patient's problem list were reviewed on today's visit.  Neurologic Examination  A problem focused neurological exam (12 or more points of the single system neurologic examination, vital signs counts as 1 point, cranial nerves count for 8 points) was performed.  Blood pressure 114/78, pulse 79, height 3' 11.5" (1.207 m), weight 142 lb (64.411 kg).  General - Well nourished, well developed, in no apparent distress.  Ophthalmologic - bilaterally clear edge at temporal part of the disc but blurry on the nasal parts of the disc.  Cardiovascular - Regular rate and rhythm with no murmur.  Mental Status -  Level of arousal and orientation to time, place, and person were intact. Language including expression, naming, repetition, comprehension, reading, and writing was assessed and found intact. Attention span and concentration were normal. Recent and remote memory were intact. Fund of Knowledge was assessed and was intact.  Cranial Nerves II - XII - II - Vision intact OU. Left eye central visual field defect. Right eye visual field full. III, IV, VI - Extraocular movements intact. V - Facial sensation intact bilaterally. VII - Facial movement intact bilaterally. VIII - Hearing & vestibular intact  bilaterally. X - Palate elevates symmetrically. XI - Chin turning & shoulder shrug intact bilaterally. XII - Tongue protrusion intact.  Motor Strength - The patient's strength was normal in all extremities and pronator drift was absent.  Bulk was normal and fasciculations were absent.   Motor Tone - Muscle tone was assessed at the neck and appendages and was normal.  Reflexes - The patient's reflexes were normal in all extremities and she had no pathological reflexes.  Sensory - Light touch, temperature/pinprick, vibration and proprioception, and Romberg testing were assessed and were normal.  Coordination - The patient had normal movements in the hands and feet with no ataxia or dysmetria.  Tremor was absent.  Gait and Station - The patient's transfers, posture, gait, station, and turns were observed as normal.  Data reviewed: I personally reviewed the images and agree with the radiology interpretations.  Mr Brain Wo Contrast  07/14/2013  Incomplete examination as above. No definite acute infarct identified. Abnormal diffusion weighted signal in the left occipital lobe is not confirmed on axial images and may be artifactual.  CT of the brain  1. Normal head CT with no acute intracranial abnormality identified.  2. Right sphenoid sinus disease.  2D Echocardiogram  - Left ventricle: The cavity size was normal. Systolic function was normal. The estimated ejection fraction was in the range of 55% to 60%. Wall motion was normal; there were no regional wall motion abnormalities. - Atrial septum: No defect or patent foramen ovale was identified. Carotid Doppler  Prelim: <39% stenosis bilat  CXR  No active cardiopulmonary disease  EKG .  Sinus rhythm  Borderline T abnormalities, anterior leads  Baseline wander in lead(s) V3 V4 V5 V6  Component     Latest Ref Rng 07/14/2013  Cholesterol     0 - 200 mg/dL 212 (H)  Triglycerides     <150 mg/dL 531 (H)  HDL     >39 mg/dL 29 (L)   Total CHOL/HDL Ratio      7.3  VLDL     0 - 40 mg/dL UNABLE TO CALCULATE IF TRIGLYCERIDE OVER 400 mg/dL  LDL (calc)     0 - 99 mg/dL UNABLE TO CALCULATE IF TRIGLYCERIDE OVER 400 mg/dL  Hemoglobin A1C     <5.7 % 5.3  Mean Plasma Glucose     <117 mg/dL 105  Sed Rate     0 - 22 mm/hr 20     Assessment: As you may recall, she is a 53 y.o. Caucasian female with PMH of HTN, HLD, smoker, estrogen use presented to hospital in 06/2013 for left side partial vision loss. No right eye visual field deficit or arm leg involvement. She stated that her left eye visual field improved over last month. MRI negative for stroke in 06/2013. Has ophthalmologist follow up for visual field testing and monitoring. She has multiple risk factors for stroke and vasculopathy. 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 From her fundi exam, bilateral similar with nasal part of disc blurry, not sure it is Anterior ION or posterior ION. But treatment plan will not change. She will continue ASA and lipitor for stroke prevention. And follow up with PCP for stroke risk factor modification.   Plan:  - continue ASA and lipitor for stroke prevention - continue follow up with ophthalmologist for visual field monitoring  - Quit smoking and encourage husband to quit too. - Follow up with PCP for stroke risk factor modification - Continue other home meds and monitor BP at home - Follow  Up in clinic in 3 months, and may repeat stroke labs at that time. - will request ophthalmology visit record.  Diagnoses from this visit: Vision loss, left eye  Ischemic optic neuropathy of left eye  No orders of the defined types were placed in this encounter.   Meds ordered this encounter  Medications  .       .       .       .       Marland Kitchen       Marland Kitchen  atorvastatin (LIPITOR) 20 MG tablet    Sig: Take 1 tablet (20 mg total) by mouth daily at 6 PM.    Dispense:  30 tablet    Refill:  5   Patient Instructions   1. Continue ASA 325 and lipitor now for stroke prevention 2. You need to follow up with eye doctor for monitor visual field 3. Quit smoking 4. Follow up with PCP for stroke risk factor modification 5. Continue other home meds 6. Monitor BP at home 7. Follow  Up in clinic in 3 months.   Rosalin Hawking, MD PhD [facility] [address] [place], [place] [address] [phone]

## 2013-08-19 NOTE — Telephone Encounter (Signed)
[facility]and placed on his desk (Dr Roda ShuttersXu )for his review

## 2013-08-20 NOTE — Telephone Encounter (Signed)
Gave notes from [facility]to Deeann CreeDonna S to scan into epic

## 2013-09-17 ENCOUNTER — Emergency Department (HOSPITAL_COMMUNITY)
Admission: EM | Admit: 2013-09-17 | Discharge: 2013-09-17 | Disposition: A | Discharge status: Home / Self Care | Payer: BC Managed Care – PPO | Attending: Emergency Medicine | Admitting: Emergency Medicine

## 2013-09-17 ENCOUNTER — Telehealth: Payer: Self-pay | Admitting: Neurology

## 2013-09-17 ENCOUNTER — Emergency Department (HOSPITAL_COMMUNITY): Payer: BC Managed Care – PPO

## 2013-09-17 ENCOUNTER — Encounter (HOSPITAL_COMMUNITY): Payer: Self-pay | Admitting: Emergency Medicine

## 2013-09-17 DIAGNOSIS — G932 Benign intracranial hypertension: Secondary | ICD-10-CM | POA: Diagnosis not present

## 2013-09-17 DIAGNOSIS — Z7982 Long term (current) use of aspirin: Secondary | ICD-10-CM | POA: Diagnosis not present

## 2013-09-17 DIAGNOSIS — Z8673 Personal history of transient ischemic attack (TIA), and cerebral infarction without residual deficits: Secondary | ICD-10-CM | POA: Diagnosis not present

## 2013-09-17 DIAGNOSIS — I10 Essential (primary) hypertension: Secondary | ICD-10-CM | POA: Insufficient documentation

## 2013-09-17 DIAGNOSIS — E785 Hyperlipidemia, unspecified: Secondary | ICD-10-CM | POA: Insufficient documentation

## 2013-09-17 DIAGNOSIS — Z79899 Other long term (current) drug therapy: Secondary | ICD-10-CM | POA: Diagnosis not present

## 2013-09-17 DIAGNOSIS — F411 Generalized anxiety disorder: Secondary | ICD-10-CM | POA: Diagnosis not present

## 2013-09-17 DIAGNOSIS — F17200 Nicotine dependence, unspecified, uncomplicated: Secondary | ICD-10-CM | POA: Insufficient documentation

## 2013-09-17 DIAGNOSIS — H4710 Unspecified papilledema: Secondary | ICD-10-CM

## 2013-09-17 DIAGNOSIS — H579 Unspecified disorder of eye and adnexa: Secondary | ICD-10-CM | POA: Diagnosis present

## 2013-09-17 HISTORY — DX: Cerebral infarction, unspecified: I63.9

## 2013-09-17 LAB — COMPREHENSIVE METABOLIC PANEL
ALBUMIN: 3.7 g/dL (ref 3.5–5.2)
ALK PHOS: 65 U/L (ref 39–117)
ALT: 12 U/L (ref 0–35)
AST: 23 U/L (ref 0–37)
Anion gap: 18 — ABNORMAL HIGH (ref 5–15)
BILIRUBIN TOTAL: 0.3 mg/dL (ref 0.3–1.2)
BUN: 20 mg/dL (ref 6–23)
CHLORIDE: 100 meq/L (ref 96–112)
CO2: 23 meq/L (ref 19–32)
Calcium: 9.5 mg/dL (ref 8.4–10.5)
Creatinine, Ser: 1.36 mg/dL — ABNORMAL HIGH (ref 0.50–1.10)
GFR calc Af Amer: 51 mL/min — ABNORMAL LOW (ref >90)
GFR, EST NON AFRICAN AMERICAN: 44 mL/min — AB (ref >90)
Glucose, Bld: 158 mg/dL — ABNORMAL HIGH (ref 70–99)
POTASSIUM: 3.4 meq/L — AB (ref 3.7–5.3)
Sodium: 141 mEq/L (ref 137–147)
Total Protein: 7.5 g/dL (ref 6.0–8.3)

## 2013-09-17 LAB — GLUCOSE, CSF: Glucose, CSF: 78 mg/dL — ABNORMAL HIGH (ref 43–76)

## 2013-09-17 LAB — CSF CELL COUNT WITH DIFFERENTIAL
RBC Count, CSF: 13 /mm3 — ABNORMAL HIGH
RBC Count, CSF: 54 /mm3 — ABNORMAL HIGH
Tube #: 1
Tube #: 4
WBC CSF: 1 /mm3 (ref 0–5)
WBC, CSF: 1 /mm3 (ref 0–5)

## 2013-09-17 LAB — I-STAT CHEM 8, ED
BUN: 23 mg/dL (ref 6–23)
CREATININE: 1.6 mg/dL — AB (ref 0.50–1.10)
Calcium, Ion: 1.12 mmol/L (ref 1.12–1.23)
Chloride: 101 mEq/L (ref 96–112)
Glucose, Bld: 146 mg/dL — ABNORMAL HIGH (ref 70–99)
HCT: 40 % (ref 36.0–46.0)
HEMOGLOBIN: 13.6 g/dL (ref 12.0–15.0)
POTASSIUM: 3.1 meq/L — AB (ref 3.7–5.3)
Sodium: 141 mEq/L (ref 137–147)
TCO2: 31 mmol/L (ref 0–100)

## 2013-09-17 LAB — CBC
HCT: 39 % (ref 36.0–46.0)
Hemoglobin: 13.8 g/dL (ref 12.0–15.0)
MCH: 31.2 pg (ref 26.0–34.0)
MCHC: 35.4 g/dL (ref 30.0–36.0)
MCV: 88.2 fL (ref 78.0–100.0)
Platelets: 281 10*3/uL (ref 150–400)
RBC: 4.42 MIL/uL (ref 3.87–5.11)
RDW: 12.9 % (ref 11.5–15.5)
WBC: 6.8 10*3/uL (ref 4.0–10.5)

## 2013-09-17 LAB — PROTEIN, CSF: Total  Protein, CSF: 32 mg/dL (ref 15–45)

## 2013-09-17 LAB — GRAM STAIN

## 2013-09-17 LAB — URINALYSIS, ROUTINE W REFLEX MICROSCOPIC
Bilirubin Urine: NEGATIVE
Glucose, UA: NEGATIVE mg/dL
Hgb urine dipstick: NEGATIVE
KETONES UR: 15 mg/dL — AB
NITRITE: NEGATIVE
Protein, ur: NEGATIVE mg/dL
Specific Gravity, Urine: 1.026 (ref 1.005–1.030)
UROBILINOGEN UA: 0.2 mg/dL (ref 0.0–1.0)
pH: 5.5 (ref 5.0–8.0)

## 2013-09-17 LAB — DIFFERENTIAL
BASOS PCT: 0 % (ref 0–1)
Basophils Absolute: 0 10*3/uL (ref 0.0–0.1)
Eosinophils Absolute: 0 10*3/uL (ref 0.0–0.7)
Eosinophils Relative: 0 % (ref 0–5)
LYMPHS PCT: 29 % (ref 12–46)
Lymphs Abs: 2 10*3/uL (ref 0.7–4.0)
MONOS PCT: 7 % (ref 3–12)
Monocytes Absolute: 0.5 10*3/uL (ref 0.1–1.0)
NEUTROS ABS: 4.4 10*3/uL (ref 1.7–7.7)
NEUTROS PCT: 64 % (ref 43–77)

## 2013-09-17 LAB — I-STAT TROPONIN, ED: Troponin i, poc: 0 ng/mL (ref 0.00–0.08)

## 2013-09-17 LAB — URINE MICROSCOPIC-ADD ON

## 2013-09-17 LAB — PROTIME-INR
INR: 1.01 (ref 0.00–1.49)
Prothrombin Time: 13.3 seconds (ref 11.6–15.2)

## 2013-09-17 LAB — APTT: APTT: 30 s (ref 24–37)

## 2013-09-17 LAB — RAPID URINE DRUG SCREEN, HOSP PERFORMED
Amphetamines: NOT DETECTED
BARBITURATES: NOT DETECTED
BENZODIAZEPINES: NOT DETECTED
COCAINE: NOT DETECTED
Opiates: NOT DETECTED
TETRAHYDROCANNABINOL: NOT DETECTED

## 2013-09-17 MED ORDER — ACETAZOLAMIDE 125 MG PO TABS: 250.0000 mg | ORAL_TABLET | Freq: Two times a day (BID) | ORAL | Status: DC

## 2013-09-17 MED ORDER — LORAZEPAM 2 MG/ML IJ SOLN
2.0000 mg | Freq: Once | INTRAMUSCULAR | Status: AC
  Administered 2013-09-17: 2 mg via INTRAVENOUS
  Filled 2013-09-17: qty 1

## 2013-09-17 MED ORDER — ACETAZOLAMIDE 250 MG PO TABS
250.0000 mg | ORAL_TABLET | Freq: Once | ORAL | Status: AC
  Administered 2013-09-17: 250 mg via ORAL
  Filled 2013-09-17: qty 1

## 2013-09-17 MED ORDER — DIPHENHYDRAMINE HCL 50 MG/ML IJ SOLN
25.0000 mg | Freq: Once | INTRAMUSCULAR | Status: AC
  Administered 2013-09-17: 25 mg via INTRAVENOUS
  Filled 2013-09-17: qty 1

## 2013-09-17 NOTE — Telephone Encounter (Signed)
Spoke to [place] at [employer]. And she related that Dr. Dolores Hoose wanted [place] to order and MRI due to the patient having bilateral optic disk edema.  And she wanted it done ASAP.  I asked why their practice couldn't order it, and she stated that it was a neurologic problem.  I told her I would have Dr. Roda Shutters call Dr. Dolores Hoose so they could discuss the issue.  She can be reached at [phone].

## 2013-09-17 NOTE — ED Provider Notes (Signed)
Case signed out to me by Doctor Micheline Maze to follow up on MRI results. 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 Patient is asymptomatic. Neurology saw the patient and ordered an MRI. Because the MRI did not show any abnormalities to explain the papilledema, it was recommended to perform lumbar puncture to rule out elevated intracranial pressure.  This was discussed with the patient. She was informed of the possible diagnosis, risks and benefits discussed. She did give consent for the procedure.  Suture was performed. Patient had opening pressure of 28. This confirmed pseudotumor cerebri. Closing pressure was 9. Patient tolerated procedure without complications.  Case discussed briefly with Doctor Leroy Kennedy. He recommends Diamox 250 mg twice a day and followup outpatient with neurology.  PROCEDURE: Lumbar Puncture The patient was placed in the left lateral position with help from the nursing staff. The area was cleansed and draped in usual sterile fashion. Anesthesia was achieved with 1% lidocaine. A 20-gauge 3.5-inch spinal needle was placed in the L4-L5 interspace. On the first attempt, clear cerebral spinal fluid was obtained. Opening pressure was 28. 16 mL of CSF was therefore collected for therapeutic treatment of elevated intracranial pressure. Closing pressure was 9. CSF sent for the usual tests, but it is not felt that the patient needs to stay for results at this time.. The patient had no immediate complications and tolerated the procedure well.    Gilda Crease, MD 09/17/13 2028

## 2013-09-17 NOTE — ED Notes (Signed)
Pt comfortable with discharge and follow up instructions. Prescriptions x1 

## 2013-09-17 NOTE — ED Provider Notes (Signed)
CSN: [id number]     Arrival date & time 09/17/13  1406 History   First MD Initiated Contact with Patient 09/17/13 1420     Chief Complaint  Patient presents with  . Eye Problem     (Consider location/radiation/quality/duration/timing/severity/associated sxs/prior Treatment) HPI Comments: The patient presents from her ophthalmologist office. 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 On exam she was found to have bilateral disc edema. She has residual left-sided visual loss that is unchanged since her stroke one month ago. She has no visual complaints but the right eye and otherwise has no acute complaints. Specifically she denies headache, numbness, weakness, recent illness. She has been compliant with her medications that were started since the stroke was diagnosed. The patient's ophthalmologist, Dr. Reed Breech, call the patient's neurologist who recommended transport to the ED MRI brain, MRV, neurology consult, and possible LP.  Patient is a 53 y.o. female presenting with eye problem.  Eye Problem Associated symptoms: no discharge, no headaches, no nausea, no numbness, no photophobia, no vomiting and no weakness     Past Medical History  Diagnosis Date  . Hypertension   . Hyperlipidemia   . Anxiety   . OSA (obstructive sleep apnea)   . Stroke     ocular stroke   Past Surgical History  Procedure Laterality Date  . Abdominal hysterectomy     Family History  Problem Relation Age of Onset  . Breast cancer Mother    History  Substance Use Topics  . Smoking status: Current Every Day Smoker -- 1.00 packs/day  . Smokeless tobacco: Not on file  . Alcohol Use: No   OB History   Grav Para Term Preterm Abortions TAB SAB Ect Mult Living                 Review of Systems  Constitutional: Negative for fever, chills, diaphoresis, activity change, appetite change and fatigue.  HENT: Negative for congestion, facial swelling, rhinorrhea and sore throat.   Eyes: Negative  for photophobia and discharge.  Respiratory: Negative for cough, chest tightness and shortness of breath.   Cardiovascular: Negative for chest pain, palpitations and leg swelling.  Gastrointestinal: Negative for nausea, vomiting, abdominal pain and diarrhea.  Endocrine: Negative for polydipsia and polyuria.  Genitourinary: Negative for dysuria, frequency, difficulty urinating and pelvic pain.  Musculoskeletal: Negative for arthralgias, back pain, neck pain and neck stiffness.  Skin: Negative for color change and wound.  Allergic/Immunologic: Negative for immunocompromised state.  Neurological: Negative for facial asymmetry, weakness, numbness and headaches.  Hematological: Does not bruise/bleed easily.  Psychiatric/Behavioral: Negative for confusion and agitation.      Allergies  Review of patient's allergies indicates no known allergies.  Home Medications   Prior to Admission medications   Medication Sig Start Date End Date Taking? Authorizing Provider  acetaminophen (TYLENOL) 325 MG tablet Take 2 tablets (650 mg total) by mouth every 6 (six) hours as needed for mild pain. 07/15/13  Yes Esperanza Sheets, MD  aspirin EC 325 MG tablet Take 1 tablet (325 mg total) by mouth daily. 07/15/13  Yes Esperanza Sheets, MD  Aspirin-Salicylamide-Caffeine (BC HEADACHE POWDER PO) Take 1 each by mouth daily as needed (for pain).   Yes Historical Provider, MD  atorvastatin (LIPITOR) 20 MG tablet Take 1 tablet (20 mg total) by mouth daily at 6 PM. 08/19/13  Yes Marvel Plan, MD  CHANTIX 1 MG tablet Take 1 mg by mouth daily. 07/24/13  Yes Historical Provider, MD  Cholecalciferol (D3-1000) 1000 UNITS capsule Take 1,000 Units by mouth daily.   Yes Historical Provider, MD  fenofibrate 160 MG tablet Take 160 mg by mouth daily.   Yes Historical Provider, MD  hydrochlorothiazide (HYDRODIURIL) 25 MG tablet Take 25 mg by mouth daily.   Yes Historical Provider, MD  venlafaxine XR (EFFEXOR-XR) 150 MG 24 hr capsule Take  150 mg by mouth daily. 08/05/13  Yes Historical Provider, MD  vitamin B-12 (CYANOCOBALAMIN) 1000 MCG tablet Take 1,000 mcg by mouth daily.   Yes Historical Provider, MD   BP 116/73  Pulse 92  Temp(Src) 98.3 F (36.8 C)  Resp 25  SpO2 95% Physical Exam  Constitutional: She is oriented to person, place, and time. She appears well-developed and well-nourished. No distress.  HENT:  Head: Normocephalic and atraumatic.  Mouth/Throat: No oropharyngeal exudate.  Eyes: Pupils are equal, round, and reactive to light.    Neck: Normal range of motion. Neck supple.  Cardiovascular: Normal rate, regular rhythm and normal heart sounds.  Exam reveals no gallop and no friction rub.   No murmur heard. Pulmonary/Chest: Effort normal and breath sounds normal. No respiratory distress. She has no wheezes. She has no rales.  Abdominal: Soft. Bowel sounds are normal. She exhibits no distension and no mass. There is no tenderness. There is no rebound and no guarding.  Musculoskeletal: Normal range of motion. She exhibits no edema and no tenderness.  Neurological: She is alert and oriented to person, place, and time. She has normal strength. She displays no tremor. No cranial nerve deficit or sensory deficit. She exhibits normal muscle tone. She displays a negative Romberg sign. Coordination and gait normal. GCS eye subscore is 4. GCS verbal subscore is 5. GCS motor subscore is 6.  Skin: Skin is warm and dry.  Psychiatric: She has a normal mood and affect.    ED Course  Procedures (including critical care time) Labs Review Labs Reviewed  URINALYSIS, ROUTINE W REFLEX MICROSCOPIC - Abnormal; Notable for the following:    Color, Urine AMBER (*)    APPearance HAZY (*)    Ketones, ur 15 (*)    Leukocytes, UA TRACE (*)    All other components within normal limits  URINE MICROSCOPIC-ADD ON - Abnormal; Notable for the following:    Squamous Epithelial / LPF FEW (*)    Casts HYALINE CASTS (*)    All other  components within normal limits  PROTIME-INR  APTT  CBC  DIFFERENTIAL  COMPREHENSIVE METABOLIC PANEL  URINE RAPID DRUG SCREEN (HOSP PERFORMED)  I-STAT TROPOININ, ED    Imaging Review No results found.   EKG Interpretation None      MDM   Final diagnoses:  Papilledema    Pt is a 53 y.o. female with Pmhx as above who presents with report of BL disk edema on exam this afternoon by her opthalmologist, Dr. Reed Breech. She then spoke to her neurologist by phone who rec ED transport recommendations for MR/MRV, neuro consult, possible LP. On PE, pt has no complaints. Neuro exam unremarkable. Spoke w/ neurology who agrees with imaging. If normal, LP recommended. If abnormal require admission. Dr. Blinda Leatherwood will f/u with MR.        Toy Cookey, MD 09/17/13 [phone]

## 2013-09-17 NOTE — ED Notes (Addendum)
Pt was sent from Opthomologist's office with concern for ocular stroke in right eye. Per Opthomologist's office, pt had biltateral disc edema without headache.  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  Pt denies vision changes to right eye, denies pain, denies any other neuro symptoms. Dr. Micheline Maze at bedside upon pt arrival to room

## 2013-09-17 NOTE — ED Notes (Signed)
Contacted [facility] to run [employer]

## 2013-09-17 NOTE — Discharge Instructions (Signed)
Pseudotumor Cerebri Pseudotumor cerebri, also called idiopathic intracranial hypertension, is a condition that occurs due to increased pressure within your skull. Although some of the symptoms resemble those of a brain tumor, it is not a brain tumor. Symptoms occur when the increased pressure in your skull compresses brain structures. For example, pressure on the nerve responsible for vision (optic nerve) causes it to swell, resulting in visual symptoms. Pseudotumor cerebri tends to occur in obese women younger than 53 years of age. However, men and children can also develop this condition. SYMPTOMS  Symptoms of pseudotumor cerebri occur due to increased pressure within the skull. Symptoms may include:   Headaches.  Nausea and vomiting.  Dizziness.  High blood pressure.   Ringing in the ears.  Double or blurred vision.  Brief episodes of complete loss of vision.  Pain in the back, neck, or shoulders. DIAGNOSIS  Pseudotumor cerebri is diagnosed through:  A detailed eye exam, which can reveal a swollen optic nerve, as well as identifying issues such as blind spots in the vision.  An MRI or CT scan to rule out other disorders that can cause similar symptoms, such as brain tumors.  A spinal tap (lumbar puncture), which can demonstrate increased pressure within the skull. TREATMENT  There are several ways that pseudotumor cerebri is treated, including:  Medicines to decrease the production of spinal fluid and lower the pressure within your skull.  Medicines to prevent or treat headaches.  Surgery to create an opening in your optic nerve to allow excess fluid to drain out.  Surgery to place drains (shunts) in your brain to remove excess fluid. HOME CARE INSTRUCTIONS   Take all medicines as directed by your health care provider.  Go to all of your follow-up appointments.  Lose weight if you are overweight. SEEK MEDICAL CARE IF:  Any symptoms come back.  You develop  trouble with hearing, vision, balance, or your sense of smell.  You cannot eat or drink what you need.  You are more weak or tired than usual.   You are losing weight without trying. SEEK IMMEDIATE MEDICAL CARE IF:  You have new symptoms such as vision problems or difficulty walking.   You have a seizure.   You have trouble breathing.   You have a fever.  Document Released: 01/13/2013 Document Reviewed: 01/13/2013 [employer] Patient Information 2015 [employer]. This information is not intended to replace advice given to you by your health care provider. Make sure you discuss any questions you have with your health care provider.  

## 2013-09-17 NOTE — ED Notes (Signed)
Consent obtained, LP tray at bedside. Dr. Blinda Leatherwood aware.

## 2013-09-17 NOTE — Consult Note (Addendum)
NEURO HOSPITALIST CONSULT NOTE    Reason for Consult: bilateral papilledema   HPI:                                                                                                                                          Stephanie Booker is an 53 y.o. 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  Patient was at her opthalmologist for her routine follow up and dilated eye exam. During exam he noted right eye optic disk edema. Patient was sent to ED for further evaluation of right disc edema. Patient currently is having no new visual symptoms, denies blurred vision, eye pain, or HA.   Past Medical History  Diagnosis Date  . Hypertension   . Hyperlipidemia   . Anxiety   . OSA (obstructive sleep apnea)   . Stroke     ocular stroke    Past Surgical History  Procedure Laterality Date  . Abdominal hysterectomy      Family History  Problem Relation Age of Onset  . Breast cancer Mother      Social History:  reports that she has been smoking.  She does not have any smokeless tobacco history on file. She reports that she does not drink alcohol or use illicit drugs.  No Known Allergies  MEDICATIONS:                                                                                                                     Current Facility-Administered Medications  Medication Dose Route Frequency Provider Last Rate Last Dose  . diphenhydrAMINE (BENADRYL) injection 25 mg  25 mg Intravenous Once Toy Cookey, MD      . LORazepam (ATIVAN) injection 2 mg  2 mg Intravenous Once Toy Cookey, MD       Current Outpatient Prescriptions  Medication Sig Dispense Refill  . acetaminophen (TYLENOL) 325 MG tablet Take 2 tablets (650 mg total) by mouth every 6 (six) hours as needed for mild pain.      Marland Kitchen aspirin EC 325 MG tablet Take 1 tablet (325 mg total) by mouth daily.  30 tablet  0  . Aspirin-Salicylamide-Caffeine (BC HEADACHE POWDER PO) Take 1 each by mouth daily as needed (for pain).       Marland Kitchen atorvastatin (LIPITOR) 20  MG tablet Take 1 tablet (20 mg total) by mouth daily at 6 PM.  30 tablet  5  . CHANTIX 1 MG tablet Take 1 mg by mouth daily.      . Cholecalciferol (D3-1000) 1000 UNITS capsule Take 1,000 Units by mouth daily.      . fenofibrate 160 MG tablet Take 160 mg by mouth daily.      . hydrochlorothiazide (HYDRODIURIL) 25 MG tablet Take 25 mg by mouth daily.      Marland Kitchen venlafaxine XR (EFFEXOR-XR) 150 MG 24 hr capsule Take 150 mg by mouth daily.      . vitamin B-12 (CYANOCOBALAMIN) 1000 MCG tablet Take 1,000 mcg by mouth daily.        ROS:                                                                                                                                       History obtained from the patient  General ROS: negative for - chills, fatigue, fever, night sweats, weight gain or weight loss Psychological ROS: negative for - behavioral disorder, hallucinations, memory difficulties, mood swings or suicidal ideation Ophthalmic ROS: negative for - blurry vision, double vision, eye pain or loss of vision ENT ROS: negative for - epistaxis, nasal discharge, oral lesions, sore throat, tinnitus or vertigo Allergy and Immunology ROS: negative for - hives or itchy/watery eyes Hematological and Lymphatic ROS: negative for - bleeding problems, bruising or swollen lymph nodes Endocrine ROS: negative for - galactorrhea, hair pattern changes, polydipsia/polyuria or temperature intolerance Respiratory ROS: negative for - cough, hemoptysis, shortness of breath or wheezing Cardiovascular ROS: negative for - chest pain, dyspnea on exertion, edema or irregular heartbeat Gastrointestinal ROS: negative for - abdominal pain, diarrhea, hematemesis, nausea/vomiting or stool incontinence Genito-Urinary ROS: negative for - dysuria, hematuria, incontinence or urinary frequency/urgency Musculoskeletal ROS: negative for - joint swelling or muscular weakness Neurological ROS: as noted in  HPI Dermatological ROS: negative for rash and skin lesion changes   Blood pressure 111/67, resp. rate 29, SpO2 94.00%.   Neurologic Examination:                                                                                                      General: NAD Mental Status: Alert, oriented, thought content appropriate.  Speech fluent without evidence of aphasia.  Able to follow 3 step commands without difficulty. Cranial Nerves: II: Discs show papilledema in both eyes (left eye old). 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 pupils equal, round, reactive to light and accommodation III,IV, VI: ptosis not present, extra-ocular motions intact bilaterally V,VII: smile symmetric, facial light touch sensation normal bilaterally VIII: hearing normal bilaterally IX,X: gag reflex present XI: bilateral shoulder shrug XII: midline tongue extension without atrophy or fasciculations  Motor: Right : Upper extremity   5/5    Left:     Upper extremity   5/5  Lower extremity   5/5     Lower extremity   5/5 Tone and bulk:normal tone throughout; no atrophy noted Sensory: Pinprick and light touch intact throughout, bilaterally Deep Tendon Reflexes:  Right: Upper Extremity   Left: Upper extremity   biceps (C-5 to C-6) 2/4   biceps (C-5 to C-6) 2/4 tricep (C7) 2/4    triceps (C7) 2/4 Brachioradialis (C6) 2/4  Brachioradialis (C6) 2/4  Lower Extremity Lower Extremity  quadriceps (L-2 to L-4) 2/4   quadriceps (L-2 to L-4) 2/4 Achilles (S1) 2/4   Achilles (S1) 2/4  Plantars: Right: downgoing   Left: downgoing Cerebellar: normal finger-to-nose,  normal heel-to-shin test Gait: not tested CV: pulses palpable throughout    Lab Results: Basic Metabolic Panel: No results found for this basename: NA, K, CL, CO2, GLUCOSE, BUN, CREATININE, CALCIUM, MG, PHOS,  in the last 168 hours  Liver Function Tests: No results found for this basename: AST,  ALT, ALKPHOS, BILITOT, PROT, ALBUMIN,  in the last 168 hours No results found for this basename: LIPASE, AMYLASE,  in the last 168 hours No results found for this basename: AMMONIA,  in the last 168 hours  CBC:  Recent Labs Lab 09/17/13 1431  WBC 6.8  NEUTROABS 4.4  HGB 13.8  HCT 39.0  MCV 88.2  PLT 281    Cardiac Enzymes: No results found for this basename: CKTOTAL, CKMB, CKMBINDEX, TROPONINI,  in the last 168 hours  Lipid Panel: No results found for this basename: CHOL, TRIG, HDL, CHOLHDL, VLDL, LDLCALC,  in the last 168 hours  CBG: No results found for this basename: GLUCAP,  in the last 168 hours  Microbiology: No results found for this or any previous visit.  Coagulation Studies:  Recent Labs  09/17/13 1431  LABPROT 13.3  INR 1.01    Imaging: No results found.   Felicie Morn PA-C [facility] [phone]  09/17/2013, 3:30 PM  Patient seen and examined.  Clinical course and management discussed.  Necessary edits performed.  I agree with the above.  Assessment and plan of care developed and discussed below.     Assessment/Plan: 53 YO female with new right eye papilledema. 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  Unclear if this is a recurrent event or if another etiology is present.  Patient here for further evaluation. NIHSS of 0.  Patient on ASA daily.    Recommendations: 1.  MRI, MRV of the brain 2.  If above is unremarkable, LP indicated to evaluate OP and rule out BIH.  Case discussed with Dr. Lynn Ito, MD [facility] [phone]  09/17/2013  4:17 PM

## 2013-09-17 NOTE — ED Notes (Signed)
Patient tolerated LP without issue, Pt lying flat and denies pain at this time

## 2013-09-17 NOTE — ED Notes (Signed)
Contacted lab regarding delay in CMET result.

## 2013-09-18 ENCOUNTER — Telehealth: Payer: Self-pay | Admitting: Neurology

## 2013-09-18 NOTE — Telephone Encounter (Signed)
Pt stated was seen in ER on 8/27 for possible 2nd TIA.  Was instructed to follow up with Dr. Roda Shutters.  Please call anytime and leave detailed message on voice mail.

## 2013-09-18 NOTE — Telephone Encounter (Signed)
Patient had MRI in hospital.

## 2013-09-20 LAB — CSF CULTURE: CULTURE: NO GROWTH

## 2013-09-20 LAB — CSF CULTURE W GRAM STAIN

## 2013-09-21 ENCOUNTER — Telehealth: Payer: Self-pay | Admitting: Neurology

## 2013-09-21 NOTE — Telephone Encounter (Signed)
Call returned, no answer so message was left instructing husband to call back to discuss his concerns.

## 2013-09-21 NOTE — Telephone Encounter (Signed)
WID:  Spoke to husband and patient had a LP on Thursday and started to experience a headache yesterday.  They would like to know what can be done for her as she is suppose to go to work today from 3-11pm.

## 2013-09-21 NOTE — Telephone Encounter (Signed)
Noted, thank you

## 2013-09-21 NOTE — Telephone Encounter (Signed)
Called patient to make a sooner appointment for the patient to be seen.left voice mail asking the patient to  Call me back to reschedule  Appointment.

## 2013-09-21 NOTE — Telephone Encounter (Signed)
Returned call. Husband describes a headache consistent with diagnosis of a positional headache, likely related to recent lumbar puncture. He notes that it does appear to be improving. Counseled him that she should hydrate well and can try drinking some high caffeine soda. Instructed him to call our office if no improvement by tomorrow and we may consider a blood patch at that time. He expressed understanding.

## 2013-09-21 NOTE — Telephone Encounter (Signed)
Husband, Stephanie Booker called back.  Could not pick up, at the time.   Please call back [phone].

## 2013-09-22 NOTE — Telephone Encounter (Signed)
Called patient and made a sooner appointment with Dr. Roda Shutters

## 2013-10-02 ENCOUNTER — Telehealth: Payer: Self-pay | Admitting: *Deleted

## 2013-10-05 NOTE — Telephone Encounter (Signed)
Called patient and left her a voicemail asking her to call me back regarding  The test she is talking about.

## 2013-10-07 NOTE — Telephone Encounter (Signed)
Called patient  regarding my phone call to her per Dr. Roda Shutters he wanted to see her sooner in the office message was relayed... Best contact for the patient is [phone]

## 2013-10-07 NOTE — Telephone Encounter (Signed)
Patient returning your call, please return call at [phone] will be there until 2 pm.

## 2013-10-15 ENCOUNTER — Ambulatory Visit (INDEPENDENT_AMBULATORY_CARE_PROVIDER_SITE_OTHER): Payer: BC Managed Care – PPO | Admitting: Neurology

## 2013-10-15 ENCOUNTER — Encounter: Payer: Self-pay | Admitting: Neurology

## 2013-10-15 VITALS — BP 106/68 | HR 87 | Ht <= 58 in | Wt 143.0 lb

## 2013-10-15 DIAGNOSIS — H47012 Ischemic optic neuropathy, left eye: Secondary | ICD-10-CM

## 2013-10-15 DIAGNOSIS — G932 Benign intracranial hypertension: Secondary | ICD-10-CM

## 2013-10-15 DIAGNOSIS — H5462 Unqualified visual loss, left eye, normal vision right eye: Secondary | ICD-10-CM

## 2013-10-15 DIAGNOSIS — H5460 Unqualified visual loss, one eye, unspecified: Secondary | ICD-10-CM

## 2013-10-15 DIAGNOSIS — H4710 Unspecified papilledema: Secondary | ICD-10-CM

## 2013-10-15 DIAGNOSIS — H47019 Ischemic optic neuropathy, unspecified eye: Secondary | ICD-10-CM

## 2013-10-15 MED ORDER — ACETAZOLAMIDE 125 MG PO TABS: 250.0000 mg | ORAL_TABLET | Freq: Two times a day (BID) | ORAL | Status: DC

## 2013-10-15 NOTE — Patient Instructions (Signed)
-   continue ASA and lipitor for stroke prevention - continue diamox  bid - continue follow up with Dr. Venita Sheffield for ophthalmology check up and vision disturbance - follow up with PCP in Nov for stroke risk factor modification - quit smoking - follow up in 3 months.

## 2013-10-15 NOTE — Progress Notes (Signed)
STROKE NEUROLOGY FOLLOW UP NOTE  NAME: Stephanie Booker DOB: 29-Feb-1960  REASON FOR VISIT: stroke follow up HISTORY FROM: pt and chart  Today we had the pleasure of seeing Stephanie Booker in follow-up at our Neurology Clinic. Pt was accompanied by husband.   History Summary Stephanie Booker is an 53 y.o. female with PMH of HTN, HLD, current smoker, estrogen use who was admitted on 6/22 for left side vision loss. She stated that on 6/21 at night she began to have chest pain, denies any SOB, HA, vision change, weakness or numbness. She went to bed and on awakening the second day she had difficulty with vision in the left eye. She stated that she can see from left eye the lower part and outer part of the visual field but not middle and upper parts. She went to see her PCP and was directed to present to the ED for further evaluation. Her right eye vision and visual field were intact. She had MRI brian which was negative for stroke although limited study and with artifact. Her CUS and 2D echo unremarkable. However, her TG and cholesterol were high. ESR was 20. She was discharged home with ASA and lipitor and fenofibrate.   08/19/13 follow up - the patient has been doing well. She stated that her visual field on the left eye improved some, she is able to see more on the upper visual field but still has central part of vision loss on the left eye. She went to see ophthalmologist last week and was told that her left eye swollen fundus, consistent with stroke.  She has cut down her smoking ana on chantix. She has stopped estrogen since the hospitalization.   Interval History During the interval time, she had follow up with ophthalmologist Dr. 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 LP done clear but opening pressure 28. She was given diamox $RemoveBefo'250mg'[id number]$  bid. After LP, she had post LP HA and was treated with hydration and  bedrest. The HA lasted about 2 days and subsided. She did not feel much different after taking diamox. Her left eye central vision deficit may be slightly better but denies HA or N/V. She followed up with Dr. Manson Allan again two weeks ago and was told fundi look a little better. She will see her again next week. Denies any side effect from diamox so far. She is still smoking and not quit yet. She said chantix not helping too much.   REVIEW OF SYSTEMS: Full 14 system review of systems performed and notable only for those listed below and in HPI above, all others are negative:  Constitutional: N/A  Cardiovascular: N/A  Ear/Nose/Throat: N/A  Skin: N/A  Eyes: loss of vision Respiratory: N/A  Gastroitestinal: N/A  Genitourinary: N/A Hematology/Lymphatic: N/A  Endocrine: N/A  Musculoskeletal: N/A  Allergy/Immunology: N/A  Neurological: N/A  Psychiatric: N/A  The following represents the patient's updated allergies and side effects list: No Known Allergies  Labs since last visit of relevance include the following: Results for orders placed during the hospital encounter of 09/17/13  CSF CULTURE      Result Value Ref Range   Specimen Description CSF     Special Requests NONE     Gram Stain       Value: WBC PRESENT, PREDOMINANTLY PMN     NO ORGANISMS SEEN     CYTOSPIN  Performed at [facility]       Value: NO GROWTH 3 DAYS     Performed at [facility]   Report Status 09/20/2013 FINAL    GRAM STAIN      Result Value Ref Range   Specimen Description CSF     Special Requests NONE     Gram Stain       Value: CYTOSPUN     NO WBC SEEN     WBC PRESENT, PREDOMINANTLY MONONUCLEAR   Report Status 09/17/2013 FINAL    PROTIME-INR      Result Value Ref Range   Prothrombin Time 13.3  11.6 - 15.2 seconds   INR 1.01  0.00 - 1.49  APTT      Result Value Ref Range   aPTT 30  24 - 37 seconds  CBC      Result Value Ref Range   WBC 6.8  4.0 - 10.5 K/uL   RBC 4.42   3.87 - 5.11 MIL/uL   Hemoglobin 13.8  12.0 - 15.0 g/dL   HCT 39.0  36.0 - 46.0 %   MCV 88.2  78.0 - 100.0 fL   MCH 31.2  26.0 - 34.0 pg   MCHC 35.4  30.0 - 36.0 g/dL   RDW 12.9  11.5 - 15.5 %   Platelets 281  150 - 400 K/uL  DIFFERENTIAL      Result Value Ref Range   Neutrophils Relative % 64  43 - 77 %   Neutro Abs 4.4  1.7 - 7.7 K/uL   Lymphocytes Relative 29  12 - 46 %   Lymphs Abs 2.0  0.7 - 4.0 K/uL   Monocytes Relative 7  3 - 12 %   Monocytes Absolute 0.5  0.1 - 1.0 K/uL   Eosinophils Relative 0  0 - 5 %   Eosinophils Absolute 0.0  0.0 - 0.7 K/uL   Basophils Relative 0  0 - 1 %   Basophils Absolute 0.0  0.0 - 0.1 K/uL  COMPREHENSIVE METABOLIC PANEL      Result Value Ref Range   Sodium 141  137 - 147 mEq/L   Potassium 3.4 (*) 3.7 - 5.3 mEq/L   Chloride 100  96 - 112 mEq/L   CO2 23  19 - 32 mEq/L   Glucose, Bld 158 (*) 70 - 99 mg/dL   BUN 20  6 - 23 mg/dL   Creatinine, Ser 1.36 (*) 0.50 - 1.10 mg/dL   Calcium 9.5  8.4 - 10.5 mg/dL   Total Protein 7.5  6.0 - 8.3 g/dL   Albumin 3.7  3.5 - 5.2 g/dL   AST 23  0 - 37 U/L   ALT 12  0 - 35 U/L   Alkaline Phosphatase 65  39 - 117 U/L   Total Bilirubin 0.3  0.3 - 1.2 mg/dL   GFR calc non Af Amer 44 (*) >90 mL/min   GFR calc Af Amer 51 (*) >90 mL/min   Anion gap 18 (*) 5 - 15  URINE RAPID DRUG SCREEN (HOSP PERFORMED)      Result Value Ref Range   Opiates NONE DETECTED  NONE DETECTED   Cocaine NONE DETECTED  NONE DETECTED   Benzodiazepines NONE DETECTED  NONE DETECTED   Amphetamines NONE DETECTED  NONE DETECTED   Tetrahydrocannabinol NONE DETECTED  NONE DETECTED   Barbiturates NONE DETECTED  NONE DETECTED  URINALYSIS, ROUTINE W REFLEX MICROSCOPIC  Result Value Ref Range   Color, Urine AMBER (*) YELLOW   APPearance HAZY (*) CLEAR   Specific Gravity, Urine 1.026  1.005 - 1.030   pH 5.5  5.0 - 8.0   Glucose, UA NEGATIVE  NEGATIVE mg/dL   Hgb urine dipstick NEGATIVE  NEGATIVE   Bilirubin Urine NEGATIVE  NEGATIVE    Ketones, ur 15 (*) NEGATIVE mg/dL   Protein, ur NEGATIVE  NEGATIVE mg/dL   Urobilinogen, UA 0.2  0.0 - 1.0 mg/dL   Nitrite NEGATIVE  NEGATIVE   Leukocytes, UA TRACE (*) NEGATIVE  URINE MICROSCOPIC-ADD ON      Result Value Ref Range   Squamous Epithelial / LPF FEW (*) RARE   WBC, UA 0-2  <3 WBC/hpf   Bacteria, UA RARE  RARE   Casts HYALINE CASTS (*) NEGATIVE   Urine-Other MUCOUS PRESENT    CSF CELL COUNT WITH DIFFERENTIAL      Result Value Ref Range   Tube # 1     Color, CSF COLORLESS  COLORLESS   Appearance, CSF CLEAR  CLEAR   Supernatant NOT INDICATED     RBC Count, CSF 54 (*) 0 /cu mm   WBC, CSF 1  0 - 5 /cu mm   Segmented Neutrophils-CSF RARE  0 - 6 %   Lymphs, CSF RARE  40 - 80 %   Monocyte-Macrophage-Spinal Fluid RARE  15 - 45 %   Other Cells, CSF TOO FEW TO COUNT, SMEAR AVAILABLE FOR REVIEW    CSF CELL COUNT WITH DIFFERENTIAL      Result Value Ref Range   Tube # 4     Color, CSF COLORLESS  COLORLESS   Appearance, CSF CLEAR  CLEAR   Supernatant NOT INDICATED     RBC Count, CSF 13 (*) 0 /cu mm   WBC, CSF 1  0 - 5 /cu mm   Lymphs, CSF RARE  40 - 80 %   Monocyte-Macrophage-Spinal Fluid RARE  15 - 45 %   Other Cells, CSF TOO FEW TO COUNT, SMEAR AVAILABLE FOR REVIEW    GLUCOSE, CSF      Result Value Ref Range   Glucose, CSF 78 (*) 43 - 76 mg/dL  PROTEIN, CSF      Result Value Ref Range   Total  Protein, CSF 32  15 - 45 mg/dL  I-STAT TROPOININ, ED      Result Value Ref Range   Troponin i, poc 0.00  0.00 - 0.08 ng/mL   Comment 3           I-STAT CHEM 8, ED      Result Value Ref Range   Sodium 141  137 - 147 mEq/L   Potassium 3.1 (*) 3.7 - 5.3 mEq/L   Chloride 101  96 - 112 mEq/L   BUN 23  6 - 23 mg/dL   Creatinine, Ser 4.96 (*) 0.50 - 1.10 mg/dL   Glucose, Bld 457 (*) 70 - 99 mg/dL   Calcium, Ion 8.77  7.04 - 1.23 mmol/L   TCO2 31  0 - 100 mmol/L   Hemoglobin 13.6  12.0 - 15.0 g/dL   HCT 29.7  67.6 - 70.0 %    The neurologically relevant items on the patient's  problem list were reviewed on today's visit.  Neurologic Examination  A problem focused neurological exam (12 or more points of the single system neurologic examination, vital signs counts as 1 point, cranial nerves count for 8 points) was performed.  Blood  pressure 106/68, pulse 87, height 4' (1.219 m), weight 143 lb (64.864 kg).  General - Well nourished, well developed, in no apparent distress.  Ophthalmologic - bilaterally sharp edge at temporal part of the discs but blurry on the nasal parts of the discs. No change from last visit.  Cardiovascular - Regular rate and rhythm with no murmur.  Mental Status -  Level of arousal and orientation to time, place, and person were intact. Language including expression, naming, repetition, comprehension, reading, and writing was assessed and found intact. Attention span and concentration were normal. Recent and remote memory were intact. Fund of Knowledge was assessed and was intact.  Cranial Nerves II - XII - II - Vision intact OU. Left eye central visual field defect not change from last visit. Right eye visual field full. III, IV, VI - Extraocular movements intact. V - Facial sensation intact bilaterally. VII - Facial movement intact bilaterally. VIII - Hearing & vestibular intact bilaterally. X - Palate elevates symmetrically. XI - Chin turning & shoulder shrug intact bilaterally. XII - Tongue protrusion intact.  Motor Strength - The patient's strength was normal in all extremities and pronator drift was absent.  Bulk was normal and fasciculations were absent.   Motor Tone - Muscle tone was assessed at the neck and appendages and was normal.  Reflexes - The patient's reflexes were normal in all extremities and she had no pathological reflexes.  Sensory - Light touch, temperature/pinprick, vibration and proprioception, and Romberg testing were assessed and were normal.    Coordination - The patient had normal movements in the hands  and feet with no ataxia or dysmetria.  Tremor was absent.  Gait and Station - The patient's transfers, posture, gait, station, and turns were observed as normal.  Data reviewed: I personally reviewed the images and agree with the radiology interpretations.  Mr Brain Wo Contrast  07/14/2013  Incomplete examination as above. No definite acute infarct identified. Abnormal diffusion weighted signal in the left occipital lobe is not confirmed on axial images and may be artifactual.  MRI and MRV brain  09/17/13 Exam quality limited by motion  Negative MRI of the brain  Negative MR venogram of the head.  CT of the brain  1. Normal head CT with no acute intracranial abnormality identified.  2. Right sphenoid sinus disease.  2D Echocardiogram  - Left ventricle: The cavity size was normal. Systolic function was normal. The estimated ejection fraction was in the range of 55% to 60%. Wall motion was normal; there were no regional wall motion abnormalities. - Atrial septum: No defect or patent foramen ovale was identified. Carotid Doppler  Prelim: <39% stenosis bilat  CXR  No active cardiopulmonary disease  EKG .  Sinus rhythm  Borderline T abnormalities, anterior leads  Baseline wander in lead(s) V3 V4 V5 V6  Component     Latest Ref Rng 07/14/2013  Cholesterol     0 - 200 mg/dL 212 (H)  Triglycerides     <150 mg/dL 531 (H)  HDL     >39 mg/dL 29 (L)  Total CHOL/HDL Ratio      7.3  VLDL     0 - 40 mg/dL UNABLE TO CALCULATE IF TRIGLYCERIDE OVER 400 mg/dL  LDL (calc)     0 - 99 mg/dL UNABLE TO CALCULATE IF TRIGLYCERIDE OVER 400 mg/dL  Hemoglobin A1C     <5.7 % 5.3  Mean Plasma Glucose     <117 mg/dL 105  Sed Rate  0 - 22 mm/hr 20   LP 09/17/13 On the first attempt, clear cerebral spinal fluid was obtained. Opening pressure was 28. 16 mL of CSF was therefore collected for therapeutic treatment of elevated intracranial pressure. Closing pressure was 9.  Component     Latest Ref  Rng 09/17/2013         7:49 PM  Tube #      4  Color, CSF     COLORLESS COLORLESS  Appearance, CSF     CLEAR CLEAR  Supernatant      NOT INDICATED  RBC Count, CSF     0 /cu mm 13 (H)  WBC, CSF     0 - 5 /cu mm 1  Segmented Neutrophils-CSF     0 - 6 %   Lymphs, CSF     40 - 80 % RARE  Monocyte-Macrophage-Spinal Fluid     15 - 45 % RARE  Other Cells, CSF      TOO FEW TO COUNT, SMEAR AVAILABLE FOR REVIEW  Specimen Description      CSF  Special Requests      NONE  Gram Stain      CYTOSPUN . . .  Culture        Report Status      09/17/2013 FINAL  Glucose, CSF     43 - 76 mg/dL 78 (H)  Total  Protein, CSF     15 - 45 mg/dL 32   Component     Latest Ref Rng 09/17/2013         7:49 PM  Tube #      1  Color, CSF     COLORLESS COLORLESS  Appearance, CSF     CLEAR CLEAR  Supernatant      NOT INDICATED  RBC Count, CSF     0 /cu mm 54 (H)  WBC, CSF     0 - 5 /cu mm 1  Segmented Neutrophils-CSF     0 - 6 % RARE  Lymphs, CSF     40 - 80 % RARE  Monocyte-Macrophage-Spinal Fluid     15 - 45 % RARE  Other Cells, CSF      TOO FEW TO COUNT, SMEAR AVAILABLE FOR REVIEW  Specimen Description      CSF  Special Requests      NONE  Gram Stain      WBC PRESENT, PREDOMINANTLY PMN . . .  Culture      NO GROWTH 3 DAYS . . .  Report Status      09/20/2013 FINAL  Glucose, CSF     43 - 76 mg/dL   Total  Protein, CSF     15 - 45 mg/dL     Assessment: Stephanie Booker is a 60 y.o. Caucasian female with PMH of HTN, HLD, smoker, estrogen use presented to hospital in 06/2013 for left side partial vision loss at the central vision. Other visual field intact. MRI negative for stroke in 06/2013. She has multiple risk factors for stroke and vasculopathy. 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 Follow up with ophth and felt bilateral papilledema, repeat MRI and add MRV which were negative. LP showed slightly high OP and started on diamox low dose. On today's  visit, did not feel much change from last visit. I still think ION is most likely diagnosis but I agree to continue diamox for possible IIH treatment. She will continue ASA and lipitor for stroke prevention.  And follow up with PCP for stroke risk factor modification. Has not quit smoking yet.  Plan:  - continue ASA and lipitor for stroke prevention - continue lipitor and fenofibrate for HLD and follow up with PCP - continue diamox $RemoveBeforeD'250mg'[id number]$  bid - continue follow up with ophthalmologist   - smoking cessation counseling provided again. - Follow up with PCP in Nov for stroke risk factor modification. - RTC in 3 months.  Diagnoses from this visit: Papilledema - Plan: acetaZOLAMIDE (DIAMOX) 125 MG tablet  Patient Instructions  - continue ASA and lipitor for stroke prevention - continue diamox $RemoveBeforeD'250mg'[id number]$  bid - continue follow up with Dr. Jess Barters for ophthalmology check up and vision disturbance - follow up with PCP in Nov for stroke risk factor modification - quit smoking - follow up in 3 months.   Rosalin Hawking, MD PhD [facility] [address], [place], [place] [address] [phone]

## 2013-11-11 ENCOUNTER — Telehealth: Payer: Self-pay | Admitting: Neurology

## 2013-11-11 NOTE — Telephone Encounter (Signed)
Patient's husband Stephanie Booker is calling regarding patient. Patient's legs have been hurting for a few days and today knots are on both legs. Would like to know if this could be coming from the medication? Please call.

## 2013-11-11 NOTE — Telephone Encounter (Signed)
I called pt back and her husband is on the phone with me the whole time. He stated that for the last 2 days , she had bad cough and talk robitussin and Nyquil, and then she had knots in her legs with redness and tenderness on touch. I am wondering if there is any medication allergy or infections going on. She has no PCP yet, I recommend husband to take pt to the urgent care clinic or nearest ER for further evaluation.  Marvel PlanJindong Jayshawn Colston, MD PhD Stroke Neurology 11/11/2013 4:36 PM

## 2013-11-11 NOTE — Telephone Encounter (Signed)
Called patient, and left message for patient to return the call in regards to her message that she left today.

## 2013-11-11 NOTE — Telephone Encounter (Signed)
Patient returned my call, she states that she has had a bad cold, and her legs starting hurting her, states that she recently took some Nyquil and stopped the vitamin D, patient would like to know if her medication has anything to do with the knots on her leg.

## 2013-11-24 ENCOUNTER — Telehealth: Payer: Self-pay | Admitting: Neurology

## 2013-11-24 ENCOUNTER — Ambulatory Visit: Payer: BC Managed Care – PPO | Admitting: Neurology

## 2013-11-24 NOTE — Telephone Encounter (Signed)
Alisa from [facility] calling about patient's Diamox, states that it is on backorder and they want to discuss another alternative, please return call and advise.

## 2013-11-24 NOTE — Telephone Encounter (Signed)
I called back.  Spoke with the pharmacist.  She said they were not able to get the 125mg  tabs in stock due to backorder.  Instead, they have dispensed 250mg  tabs.  Patients directions are 250mg  bid.  Total dose remains the same.  Instead of taking two 125mg  tabs twice daily, they have instructed her to take one 250mg  tab twice daily.  They wanted to let us know they had to dispense meds this way due to availability issues, and will be happy to change her back to previous Rx once it become available.

## 2013-12-07 ENCOUNTER — Ambulatory Visit (INDEPENDENT_AMBULATORY_CARE_PROVIDER_SITE_OTHER): Payer: BC Managed Care – PPO | Admitting: Internal Medicine

## 2013-12-07 ENCOUNTER — Ambulatory Visit (INDEPENDENT_AMBULATORY_CARE_PROVIDER_SITE_OTHER): Payer: BC Managed Care – PPO | Admitting: Geriatric Medicine

## 2013-12-07 ENCOUNTER — Other Ambulatory Visit (INDEPENDENT_AMBULATORY_CARE_PROVIDER_SITE_OTHER): Payer: BC Managed Care – PPO

## 2013-12-07 VITALS — BP 108/72 | HR 92 | Temp 98.3°F | Resp 14 | Ht <= 58 in | Wt 148.6 lb

## 2013-12-07 DIAGNOSIS — G932 Benign intracranial hypertension: Secondary | ICD-10-CM

## 2013-12-07 DIAGNOSIS — H47012 Ischemic optic neuropathy, left eye: Secondary | ICD-10-CM

## 2013-12-07 DIAGNOSIS — M791 Myalgia, unspecified site: Secondary | ICD-10-CM

## 2013-12-07 DIAGNOSIS — E785 Hyperlipidemia, unspecified: Secondary | ICD-10-CM

## 2013-12-07 DIAGNOSIS — R5383 Other fatigue: Secondary | ICD-10-CM

## 2013-12-07 DIAGNOSIS — Z720 Tobacco use: Secondary | ICD-10-CM | POA: Insufficient documentation

## 2013-12-07 DIAGNOSIS — I10 Essential (primary) hypertension: Secondary | ICD-10-CM | POA: Insufficient documentation

## 2013-12-07 DIAGNOSIS — Z23 Encounter for immunization: Secondary | ICD-10-CM

## 2013-12-07 DIAGNOSIS — I639 Cerebral infarction, unspecified: Secondary | ICD-10-CM

## 2013-12-07 LAB — TSH: TSH: 2.01 u[IU]/mL (ref 0.35–4.50)

## 2013-12-07 LAB — BASIC METABOLIC PANEL
BUN: 21 mg/dL (ref 6–23)
CO2: 26 meq/L (ref 19–32)
CREATININE: 0.8 mg/dL (ref 0.4–1.2)
Calcium: 9 mg/dL (ref 8.4–10.5)
Chloride: 104 mEq/L (ref 96–112)
GFR: 76.4 mL/min (ref >60.00)
Glucose, Bld: 129 mg/dL — ABNORMAL HIGH (ref 70–99)
POTASSIUM: 2.9 meq/L — AB (ref 3.5–5.1)
SODIUM: 139 meq/L (ref 135–145)

## 2013-12-07 LAB — CK: Total CK: 54 U/L (ref 7–177)

## 2013-12-07 LAB — T4, FREE: FREE T4: 0.9 ng/dL (ref 0.60–1.60)

## 2013-12-07 NOTE — Assessment & Plan Note (Signed)
Continues to have central vision loss, peripheral vision is better, follows with ophthalmology.

## 2013-12-07 NOTE — Assessment & Plan Note (Signed)
Patient feels that her hormones were responsible for her stroke. I informed her that her more significant risk factors were her smoking, blood pressure, cholesterol. She continues to take full dose aspirin daily, statin, blood pressure medication. BP well controlled at today's visit. Spoke to her and strongly recommended smoking cessation.

## 2013-12-07 NOTE — Progress Notes (Signed)
Pre visit review using our clinic review tool, if applicable. No additional management support is needed unless otherwise documented below in the visit note. 

## 2013-12-07 NOTE — Patient Instructions (Signed)
We are going to check blood work today to make sure that none of your medications are giving you the muscle aches. We are also going to check levels to make sure that your thyroid is normal.  Eventually our goal is for you to stop smoking entirely. Smoking can increase her risk of stroke and heart attack. The other thing that we very helpful is to continue taking her blood pressure and cholesterol medications. Continue taking the full dose aspirin every day.  We would like to see you back in about 6 months to make sure you're still doing okay. If you have any new problems or questions before then please feel free to call our office.  Stroke Prevention Some medical conditions and behaviors are associated with an increased chance of having a stroke. You may prevent a stroke by making healthy choices and managing medical conditions. HOW CAN I REDUCE MY RISK OF HAVING A STROKE?   Stay physically active. Get at least 30 minutes of activity on most or all days.  Do not smoke. It may also be helpful to avoid exposure to secondhand smoke.  Limit alcohol use. Moderate alcohol use is considered to be:  No more than 2 drinks per day for men.  No more than 1 drink per day for nonpregnant women.  Eat healthy foods. This involves:  Eating 5 or more servings of fruits and vegetables a day.  Making dietary changes that address high blood pressure (hypertension), high cholesterol, diabetes, or obesity.  Manage your cholesterol levels.  Making food choices that are high in fiber and low in saturated fat, trans fat, and cholesterol may control cholesterol levels.  Take any prescribed medicines to control cholesterol as directed by your health care provider.  Manage your diabetes.  Controlling your carbohydrate and sugar intake is recommended to manage diabetes.  Take any prescribed medicines to control diabetes as directed by your health care provider.  Control your hypertension.  Making food  choices that are low in salt (sodium), saturated fat, trans fat, and cholesterol is recommended to manage hypertension.  Take any prescribed medicines to control hypertension as directed by your health care provider.  Maintain a healthy weight.  Reducing calorie intake and making food choices that are low in sodium, saturated fat, trans fat, and cholesterol are recommended to manage weight.  Stop drug abuse.  Avoid taking birth control pills.  Talk to your health care provider about the risks of taking birth control pills if you are over 53 years old, smoke, get migraines, or have ever had a blood clot.  Get evaluated for sleep disorders (sleep apnea).  Talk to your health care provider about getting a sleep evaluation if you snore a lot or have excessive sleepiness.  Take medicines only as directed by your health care provider.  For some people, aspirin or blood thinners (anticoagulants) are helpful in reducing the risk of forming abnormal blood clots that can lead to stroke. If you have the irregular heart rhythm of atrial fibrillation, you should be on a blood thinner unless there is a good reason you cannot take them.  Understand all your medicine instructions.  Make sure that other conditions (such as anemia or atherosclerosis) are addressed. SEEK IMMEDIATE MEDICAL CARE IF:   You have sudden weakness or numbness of the face, arm, or leg, especially on one side of the body.  Your face or eyelid droops to one side.  You have sudden confusion.  You have trouble speaking (aphasia) or  understanding.  You have sudden trouble seeing in one or both eyes.  You have sudden trouble walking.  You have dizziness.  You have a loss of balance or coordination.  You have a sudden, severe headache with no known cause.  You have new chest pain or an irregular heartbeat. Any of these symptoms may represent a serious problem that is an emergency. Do not wait to see if the symptoms will  go away. Get medical help at once. Call your local emergency services (911 in [place] Do not drive yourself to the hospital. Document Released: 02/16/2004 Document Revised: 05/25/2013 Document Reviewed: 07/11/2012 [facility] Patient Information 2015 [employer], [place]. This information is not intended to replace advice given to you by your health care provider. Make sure you discuss any questions you have with your health care provider.

## 2013-12-07 NOTE — Assessment & Plan Note (Addendum)
Acetazolamide for symptom reduction and less headaches since spinal tap.

## 2013-12-07 NOTE — Assessment & Plan Note (Addendum)
Patiently currently taking HCTZ 25 mg daily for blood pressure. Check BMP given muscle cramps she may have hypokalemia.

## 2013-12-07 NOTE — Progress Notes (Signed)
   Subjective:    Patient ID: Stephanie Booker, female    DOB: 11/01/1960, 53 y.o.   MRN: [id number]  HPI The patient is a 53 year old female who comes in today to establish care. 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 She is currently smoking 1 pack per day and has no intention to quit right now. She stated she tried Chantix but it made her feel weird so she discontinued. Her husband is also a smoker and smokes about 1 pack per day as well. He accompanies her today and states that he would quit in order to help her try to quit. She denies any new problems. She is working with her ophthalmologist and has partial peripheral vision in her left eye however midline position is still gone from her left eye. This is been very hard for her to adjust to. She works at a nursing home. She has been having some muscle aches and is unclear if this may be coming from medication or what may be causing it. She is also very tired, constipated, gaining weight. She feels that the medications have constipated her. She takes OTC medication successfully. She denies any chest pain, new weakness or numbness, vision changes, sensation changes.  Review of Systems  Constitutional: Positive for fatigue. Negative for fever, activity change, appetite change and unexpected weight change.  HENT: Negative.   Respiratory: Negative for cough, chest tightness, shortness of breath and wheezing.   Cardiovascular: Negative for chest pain, palpitations and leg swelling.  Gastrointestinal: Positive for constipation. Negative for abdominal pain, diarrhea, blood in stool and abdominal distention.  Musculoskeletal: Positive for myalgias. Negative for back pain, arthralgias and gait problem.  Skin: Negative.   Neurological: Negative for dizziness, weakness, light-headedness and headaches.      Objective:   Physical Exam    Constitutional: She is oriented to person, place, and time. She appears well-developed and well-nourished.  HENT:  Head: Normocephalic and atraumatic.  Eyes: EOM are normal.  Neck: Normal range of motion.  Cardiovascular: Normal rate and regular rhythm.   Pulmonary/Chest: Effort normal and breath sounds normal.  Abdominal: Soft. Bowel sounds are normal. She exhibits no distension. There is no tenderness. There is no rebound.  Musculoskeletal: Normal range of motion.  Neurological: She is alert and oriented to person, place, and time.  Skin: Skin is warm and dry.   Filed Vitals:   12/07/13 1312  BP: 108/72  Pulse: 92  Temp: 98.3 F (36.8 C)  TempSrc: Oral  Resp: 14  Height: 4\' 10"  (1.473 m)  Weight: 148 lb 9.6 oz (67.405 kg)  SpO2: 95%      Assessment & Plan:

## 2013-12-07 NOTE — Assessment & Plan Note (Signed)
After recent stroke, lipid panel showed increased triglycerides as well as increased LDL. She was started on fenofibrate as well as statin. Will recheck lipid panel at 6 months, continue current therapy.

## 2013-12-08 ENCOUNTER — Encounter: Payer: Self-pay | Admitting: Internal Medicine

## 2013-12-08 NOTE — Assessment & Plan Note (Signed)
Patient has no intention of quitting at this time. We did discuss the increased risk of heart attack, stroke, death from tobacco abuse.

## 2013-12-09 ENCOUNTER — Telehealth: Payer: Self-pay | Admitting: Internal Medicine

## 2013-12-09 NOTE — Telephone Encounter (Signed)
[facility] from [facility] 70 pages to Dr. Dorise HissKollar

## 2013-12-09 NOTE — Addendum Note (Signed)
Addended by: Genella MechKOLLAR, Caterina Racine A on: 12/09/2013 08:59 AM   Modules accepted: Orders

## 2013-12-09 NOTE — Telephone Encounter (Signed)
Patient states she had a phone call this morning stating someone told her not to take a certain pill today.  She is confused and does not know which pill to not take and would like a call back in regards.

## 2013-12-23 ENCOUNTER — Other Ambulatory Visit (INDEPENDENT_AMBULATORY_CARE_PROVIDER_SITE_OTHER): Payer: BC Managed Care – PPO

## 2013-12-23 DIAGNOSIS — I10 Essential (primary) hypertension: Secondary | ICD-10-CM

## 2013-12-23 LAB — BASIC METABOLIC PANEL
BUN: 21 mg/dL (ref 6–23)
CALCIUM: 9.1 mg/dL (ref 8.4–10.5)
CO2: 20 mEq/L (ref 19–32)
Chloride: 111 mEq/L (ref 96–112)
Creatinine, Ser: 0.7 mg/dL (ref 0.4–1.2)
GFR: 91.47 mL/min (ref >60.00)
Glucose, Bld: 70 mg/dL (ref 70–99)
Potassium: 3.8 mEq/L (ref 3.5–5.1)
SODIUM: 137 meq/L (ref 135–145)

## 2013-12-24 ENCOUNTER — Telehealth: Payer: Self-pay | Admitting: Neurology

## 2013-12-24 NOTE — Telephone Encounter (Signed)
I called back.  Patient will check with PCP regarding this refill and will call us back if anything further is needed.

## 2013-12-24 NOTE — Telephone Encounter (Signed)
Patient requesting Rx refill for levocetirizine (XYZAL) 5 MG tablet.  Please call and advise.

## 2013-12-25 ENCOUNTER — Other Ambulatory Visit: Payer: Self-pay | Admitting: Geriatric Medicine

## 2013-12-25 ENCOUNTER — Telehealth: Payer: Self-pay | Admitting: Internal Medicine

## 2013-12-25 MED ORDER — LEVOCETIRIZINE DIHYDROCHLORIDE 5 MG PO TABS: 5.0000 mg | ORAL_TABLET | Freq: Every evening | ORAL | Status: DC

## 2013-12-25 NOTE — Telephone Encounter (Signed)
Pt is asking refill of Levocetirizine 5 mg, no refills left. Pt called pharm told to call MD

## 2013-12-25 NOTE — Telephone Encounter (Signed)
[facility] [address] [place] [phone]

## 2013-12-25 NOTE — Telephone Encounter (Signed)
Sent to pharmacy 

## 2014-01-25 ENCOUNTER — Encounter: Payer: Self-pay | Admitting: Internal Medicine

## 2014-01-27 ENCOUNTER — Encounter: Payer: Self-pay | Admitting: Internal Medicine

## 2014-02-03 ENCOUNTER — Ambulatory Visit (INDEPENDENT_AMBULATORY_CARE_PROVIDER_SITE_OTHER): Payer: BLUE CROSS/BLUE SHIELD | Admitting: Neurology

## 2014-02-03 ENCOUNTER — Encounter: Payer: Self-pay | Admitting: Neurology

## 2014-02-03 VITALS — BP 117/74 | HR 67 | Ht <= 58 in | Wt 151.2 lb

## 2014-02-03 DIAGNOSIS — Z720 Tobacco use: Secondary | ICD-10-CM

## 2014-02-03 DIAGNOSIS — I10 Essential (primary) hypertension: Secondary | ICD-10-CM

## 2014-02-03 DIAGNOSIS — H47012 Ischemic optic neuropathy, left eye: Secondary | ICD-10-CM

## 2014-02-03 DIAGNOSIS — E785 Hyperlipidemia, unspecified: Secondary | ICD-10-CM

## 2014-02-03 DIAGNOSIS — R200 Anesthesia of skin: Secondary | ICD-10-CM | POA: Insufficient documentation

## 2014-02-03 DIAGNOSIS — F17200 Nicotine dependence, unspecified, uncomplicated: Secondary | ICD-10-CM | POA: Insufficient documentation

## 2014-02-03 DIAGNOSIS — R208 Other disturbances of skin sensation: Secondary | ICD-10-CM

## 2014-02-03 NOTE — Patient Instructions (Addendum)
-   continue ASA and lipitor for stroke prevention - continue fenofibrate for HLD - check lipid panel and A1C today  - continue diamox for HA and high brain pressure - will do EMG/Nerve conduction study for CTS - quit smoking - Follow up with your primary care physician for stroke risk factor modification. Recommend maintain blood pressure goal <130/80, diabetes with hemoglobin A1c goal below 6.5% and lipids with LDL cholesterol goal below 70 mg/dL.  - check BP at home - follow up in 3 months

## 2014-02-03 NOTE — Progress Notes (Signed)
STROKE NEUROLOGY FOLLOW UP NOTE  NAME: Stephanie Booker DOB: 01/27/1960  REASON FOR VISIT: stroke follow up HISTORY FROM: pt and chart  Today we had the pleasure of seeing Stephanie Booker in follow-up at our Neurology Clinic. Pt was accompanied by husband.   History Summary Stephanie Booker is an 54 y.o. female with PMH of HTN, HLD, current smoker, estrogen use who was admitted on 6/22 for left side vision loss. She stated that on 6/21 at night she began to have chest pain, denies any SOB, HA, vision change, weakness or numbness. She went to bed and on awakening the second day she had difficulty with vision in the left eye. She stated that she can see from left eye the lower part and outer part of the visual field but not middle and upper parts. She went to see her PCP and was directed to present to the ED for further evaluation. Her right eye vision and visual field were intact. She had MRI brian which was negative for stroke although limited study and with artifact. Her CUS and 2D echo unremarkable. However, her TG and cholesterol were high. ESR was 20. She was discharged home with ASA and lipitor and fenofibrate.   08/19/13 follow up - the patient has been doing well. She stated that her visual field on the left eye improved some, she is able to see more on the upper visual field but still has central part of vision loss on the left eye. She went to see ophthalmologist last week and was told that her left eye swollen fundus, consistent with stroke.  She has cut down her smoking ana on chantix. She has stopped estrogen since the hospitalization.   10/15/13 follow up - she had follow up with ophthalmologist Dr. 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 LP done clear but opening pressure 28. She was given diamox 250mg  bid. After LP, she had post LP HA and was treated with hydration and bedrest. The HA lasted about 2  days and subsided. She did not feel much different after taking diamox. Her left eye central vision deficit may be slightly better but denies HA or N/V. She followed up with Dr. Dolores HooseAlmony again two weeks ago and was told fundi look a little better. She will see her again next week. Denies any side effect from diamox so far. She is still smoking and not quit yet. She said chantix not helping too much.   Interval History During the interval time, pt was doing the same. Left eye visual deficit remained the same. Denies headache. She followed up with ophthalmologist and was told left eye papilledema getting better. She complains of L>R hand tingling and throbbing pain at first 4 fingertips. Constant day and night, sometimes not able to open the jar affecting daily activities. 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 She has to take some advil sometimes for pain control.  REVIEW OF SYSTEMS: Full 14 system review of systems performed and notable only for those listed below and in HPI above, all others are negative:  Constitutional: N/A  Cardiovascular: N/A  Ear/Nose/Throat: N/A  Skin: N/A  Eyes: loss of vision Respiratory: N/A  Gastroitestinal: N/A  Genitourinary: N/A Hematology/Lymphatic: N/A  Endocrine: N/A  Musculoskeletal: N/A  Allergy/Immunology: N/A  Neurological: restless leg  Psychiatric: N/A  The following represents the patient's updated allergies and side  effects list: No Known Allergies  Labs since last visit of relevance include the following: Results for orders placed or performed in visit on 45/62/56  Basic Metabolic Panel (BMET)  Result Value Ref Range   Sodium 137 135 - 145 mEq/L   Potassium 3.8 3.5 - 5.1 mEq/L   Chloride 111 96 - 112 mEq/L   CO2 20 19 - 32 mEq/L   Glucose, Bld 70 70 - 99 mg/dL   BUN 21 6 - 23 mg/dL   Creatinine, Ser 0.7 0.4 - 1.2 mg/dL   Calcium 9.1 8.4 - 10.5 mg/dL   GFR 91.47 >60.00 mL/min    The neurologically relevant items on the  patient's problem list were reviewed on today's visit.  Neurologic Examination  A problem focused neurological exam (12 or more points of the single system neurologic examination, vital signs counts as 1 point, cranial nerves count for 8 points) was performed.  Blood pressure 117/74, pulse 67, height $RemoveBe'4\' 10"'AHwzXTJcy$  (1.473 m), weight 151 lb 3.2 oz (68.584 kg).  General - Well nourished, well developed, in no apparent distress.  Ophthalmologic - bilaterally sharp edge at temporal part of the discs but blurry on the nasal parts of the discs. No change from last visit.  Cardiovascular - Regular rate and rhythm with no murmur.  Mental Status -  Level of arousal and orientation to time, place, and person were intact. Language including expression, naming, repetition, comprehension, reading, and writing was assessed and found intact. Attention span and concentration were normal. Recent and remote memory were intact. Fund of Knowledge was assessed and was intact.  Cranial Nerves II - XII - II - Vision intact OU. Left eye central visual field defect not change from last visit. Right eye visual field full. III, IV, VI - Extraocular movements intact. V - Facial sensation intact bilaterally. VII - Facial movement intact bilaterally. VIII - Hearing & vestibular intact bilaterally. X - Palate elevates symmetrically. XI - Chin turning & shoulder shrug intact bilaterally. XII - Tongue protrusion intact.  Motor Strength - The patient's strength was normal in all extremities and pronator drift was absent.  Bulk was normal and fasciculations were absent.   Motor Tone - Muscle tone was assessed at the neck and appendages and was normal.  Reflexes - The patient's reflexes were normal in all extremities and she had no pathological reflexes.  Sensory - Light touch, temperature/pinprick, vibration and proprioception, and Romberg testing were assessed and were normal.    Coordination - The patient had normal  movements in the hands and feet with no ataxia or dysmetria.  Tremor was absent.  Gait and Station - The patient's transfers, posture, gait, station, and turns were observed as normal. Phalen's and Tinel tests were negative.   Data reviewed: I personally reviewed the images and agree with the radiology interpretations.  Mr Brain Wo Contrast  07/14/2013  Incomplete examination as above. No definite acute infarct identified. Abnormal diffusion weighted signal in the left occipital lobe is not confirmed on axial images and may be artifactual.  MRI and MRV brain  09/17/13 Exam quality limited by motion  Negative MRI of the brain  Negative MR venogram of the head.  CT of the brain  1. Normal head CT with no acute intracranial abnormality identified.  2. Right sphenoid sinus disease.  2D Echocardiogram  - Left ventricle: The cavity size was normal. Systolic function was normal. The estimated ejection fraction was in the range of 55% to 60%. Wall motion was  normal; there were no regional wall motion abnormalities. - Atrial septum: No defect or patent foramen ovale was identified. Carotid Doppler  Prelim: <39% stenosis bilat  CXR  No active cardiopulmonary disease  EKG .  Sinus rhythm  Borderline T abnormalities, anterior leads  Baseline wander in lead(s) V3 V4 V5 V6  Component     Latest Ref Rng 07/14/2013  Cholesterol     0 - 200 mg/dL 212 (H)  Triglycerides     <150 mg/dL 531 (H)  HDL     >39 mg/dL 29 (L)  Total CHOL/HDL Ratio      7.3  VLDL     0 - 40 mg/dL UNABLE TO CALCULATE IF TRIGLYCERIDE OVER 400 mg/dL  LDL (calc)     0 - 99 mg/dL UNABLE TO CALCULATE IF TRIGLYCERIDE OVER 400 mg/dL  Hemoglobin A1C     <5.7 % 5.3  Mean Plasma Glucose     <117 mg/dL 105  Sed Rate     0 - 22 mm/hr 20   LP 09/17/13 On the first attempt, clear cerebral spinal fluid was obtained. Opening pressure was 28. 16 mL of CSF was therefore collected for therapeutic treatment of elevated intracranial  pressure. Closing pressure was 9.  Component     Latest Ref Rng 09/17/2013         7:49 PM  Tube #      4  Color, CSF     COLORLESS COLORLESS  Appearance, CSF     CLEAR CLEAR  Supernatant      NOT INDICATED  RBC Count, CSF     0 /cu mm 13 (H)  WBC, CSF     0 - 5 /cu mm 1  Segmented Neutrophils-CSF     0 - 6 %   Lymphs, CSF     40 - 80 % RARE  Monocyte-Macrophage-Spinal Fluid     15 - 45 % RARE  Other Cells, CSF      TOO FEW TO COUNT, SMEAR AVAILABLE FOR REVIEW  Specimen Description      CSF  Special Requests      NONE  Gram Stain      CYTOSPUN . . .  Culture        Report Status      09/17/2013 FINAL  Glucose, CSF     43 - 76 mg/dL 78 (H)  Total  Protein, CSF     15 - 45 mg/dL 32   Component     Latest Ref Rng 09/17/2013         7:49 PM  Tube #      1  Color, CSF     COLORLESS COLORLESS  Appearance, CSF     CLEAR CLEAR  Supernatant      NOT INDICATED  RBC Count, CSF     0 /cu mm 54 (H)  WBC, CSF     0 - 5 /cu mm 1  Segmented Neutrophils-CSF     0 - 6 % RARE  Lymphs, CSF     40 - 80 % RARE  Monocyte-Macrophage-Spinal Fluid     15 - 45 % RARE  Other Cells, CSF      TOO FEW TO COUNT, SMEAR AVAILABLE FOR REVIEW  Specimen Description      CSF  Special Requests      NONE  Gram Stain      WBC PRESENT, PREDOMINANTLY PMN . . .  Culture      NO  GROWTH 3 DAYS . . .  Report Status      09/20/2013 FINAL  Glucose, CSF     43 - 76 mg/dL   Total  Protein, CSF     15 - 45 mg/dL     Assessment: Ms. Stephanie Booker is a 54 y.o. Caucasian female with PMH of HTN, HLD, smoker, estrogen use presented to hospital in 06/2013 for left side partial vision loss at the central vision. Other visual field intact. MRI negative for stroke in 06/2013. She has multiple risk factors for stroke and vasculopathy. 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 Follow up with ophth and felt bilateral papilledema, repeat MRI and add MRV which were negative. LP showed  slightly high OP and started on diamox low dose. On today's visit, did not feel much change from last visit. I still think ION is most likely diagnosis but I agree to continue diamox for possible IIH treatment. She will continue ASA and lipitor for stroke prevention. And follow up with PCP for stroke risk factor modification. For the fingertip throbbing pain, will do EMG/NCS. Has not quit smoking yet.  Plan:  - continue ASA and lipitor for stroke prevention - continue lipitor and fenofibrate for HLD - continue diamox $RemoveBeforeD'250mg'eePsQAZWCvytPd$  bid - continue follow up with ophthalmologist   - smoking cessation counseling provided again. - will check LDL and A1C - EMG/NCS for CTS - Follow up with your primary care physician for stroke risk factor modification. Recommend maintain blood pressure goal <130/80, diabetes with hemoglobin A1c goal below 6.5% and lipids with LDL cholesterol goal below 70 mg/dL.  - RTC in 3 months.  Diagnoses from this visit: Hyperlipidemia - Plan: KLOR-CON M10 10 MEQ tablet, Lipid panel, Hemoglobin A1c  Ischemic optic neuropathy of left eye - Plan: KLOR-CON M10 10 MEQ tablet, Lipid panel, Hemoglobin A1c  Hand numbness - Plan: NCV with EMG(electromyography)  Patient Instructions  - continue ASA and lipitor for stroke prevention - continue fenofibrate for HLD - check lipid panel and A1C today  - continue diamox for HA and high brain pressure - will do EMG/Nerve conduction study for CTS - quit smoking - Follow up with your primary care physician for stroke risk factor modification. Recommend maintain blood pressure goal <130/80, diabetes with hemoglobin A1c goal below 6.5% and lipids with LDL cholesterol goal below 70 mg/dL.  - check BP at home - follow up in 3 months   Rosalin Hawking, MD PhD [facility] [address], [place], [place] [address] [phone]

## 2014-02-04 ENCOUNTER — Telehealth: Payer: Self-pay | Admitting: Neurology

## 2014-02-04 LAB — LIPID PANEL
CHOLESTEROL TOTAL: 165 mg/dL (ref 100–199)
Chol/HDL Ratio: 4.5 ratio units — ABNORMAL HIGH (ref 0.0–4.4)
HDL: 37 mg/dL — ABNORMAL LOW (ref >39)
LDL CALC: 80 mg/dL (ref 0–99)
Triglycerides: 242 mg/dL — ABNORMAL HIGH (ref 0–149)
VLDL CHOLESTEROL CAL: 48 mg/dL — AB (ref 5–40)

## 2014-02-04 LAB — HEMOGLOBIN A1C
Est. average glucose Bld gHb Est-mCnc: 111 mg/dL
Hgb A1c MFr Bld: 5.5 % (ref 4.8–5.6)

## 2014-02-04 NOTE — Telephone Encounter (Signed)
Called pt to relay the blood test results. Her husband was on the phone with me. Informed him to continue her current medication therapy. She does not have DM and cholesterol is much better than 6 months ago. He expressed understanding and appreciation.  Marvel PlanJindong Brondon Wann, MD PhD Stroke Neurology 02/04/2014 5:06 PM

## 2014-02-12 ENCOUNTER — Encounter: Payer: BLUE CROSS/BLUE SHIELD | Admitting: Radiology

## 2014-02-12 ENCOUNTER — Encounter: Payer: BLUE CROSS/BLUE SHIELD | Admitting: Neurology

## 2014-02-14 ENCOUNTER — Other Ambulatory Visit: Payer: Self-pay | Admitting: Neurology

## 2014-02-17 ENCOUNTER — Ambulatory Visit (INDEPENDENT_AMBULATORY_CARE_PROVIDER_SITE_OTHER): Payer: Self-pay | Admitting: Neurology

## 2014-02-17 ENCOUNTER — Ambulatory Visit (INDEPENDENT_AMBULATORY_CARE_PROVIDER_SITE_OTHER): Payer: BLUE CROSS/BLUE SHIELD | Admitting: Neurology

## 2014-02-17 DIAGNOSIS — G5602 Carpal tunnel syndrome, left upper limb: Secondary | ICD-10-CM

## 2014-02-17 DIAGNOSIS — G5601 Carpal tunnel syndrome, right upper limb: Secondary | ICD-10-CM

## 2014-02-17 DIAGNOSIS — G5603 Carpal tunnel syndrome, bilateral upper limbs: Secondary | ICD-10-CM

## 2014-02-17 DIAGNOSIS — R200 Anesthesia of skin: Secondary | ICD-10-CM

## 2014-02-17 MED ORDER — GABAPENTIN 300 MG PO CAPS: 300.0000 mg | ORAL_CAPSULE | Freq: Three times a day (TID) | ORAL | Status: DC

## 2014-02-17 NOTE — Progress Notes (Signed)
See procedure note.

## 2014-02-17 NOTE — Procedures (Signed)
   [facility]    Provider:  Dr Lucia GaskinsAhern Referring Provider: Judie BonusKollar, Elizabeth A, MD Primary Care Physician:  Judie BonusKollar, Elizabeth A, MD  History:  Stephanie Booker is a 54 y.o. female here as a referral from Dr. Dorise HissKollar for bilateral hand pain. Symptoms started 15 years ago, recently her left hand is getting worse. Left >> right symptoms. She describes continuous numbnesss and tingling in the tips of digits 1-4. She endorses weakness of grip and has difficulty opening jars. Denies neck pain.  The left median motor nerve showed prolonged distal onset latency (8.8 ms, N<4.0) with normal F wave latency.  The right median motor nerve showed prolonged distal onset latency (6.7 ms, N<4.0) with normal F wave latency.  The left Median 2nd Digit sensory nerve showed no response The right Median 2nd Digit sensory nerve showed no response   The right Ulnar ADM motor nerve showed normal conductions. with normal F wave latency. The left Ulnar ADM motor nerve showed normal conductions. with normal F wave latency.  The right  Ulnar 5th digit sensory nerve showed normal conductions. The left Ulnar  5th digit sensory nerve showed normal conductions..  The right median/ulnar (palmar mixed) comparison study showed no response at the Median wrist and normal conduction at the Ulnar wrist  The left median/ulnar (palmar mixed) comparison study showed no response at the Median wrist and normal conduction at the Ulnar wrist    EMG needle study of selected left upper extremity muscles was performed. The following muscles were normal: Deltoid, Triceps, Pronator Teres, Opponens Pollicis, First Dorsal Interosseous. This is a limited EMG study as patient requested the test be terminated prematurely due to pain.    Conclusion:  There is electrophysiologic evidence for left > right  moderately-severe Carpal Tunnel Syndrome. There is no suggestion of polyneuropathy or cervical radiculopathy.  This is a  somewhat limited study as patient requested the EMG needle study be terminated prematurely due to pain. Clinical correlation recommended.   Naomie DeanAntonia Teresia Myint, MD  [facility] [address] [place], [place] [id number]  Phone [phone] Fax [phone]

## 2014-02-18 NOTE — Progress Notes (Signed)
See procedure note.

## 2014-02-18 NOTE — Addendum Note (Signed)
Addended by: Naomie DeanAHERN, ANTONIA B on: 02/18/2014 11:16 AM   Modules accepted: Level of Service

## 2014-03-09 ENCOUNTER — Telehealth: Payer: Self-pay | Admitting: Internal Medicine

## 2014-03-09 ENCOUNTER — Other Ambulatory Visit: Payer: Self-pay | Admitting: Geriatric Medicine

## 2014-03-09 MED ORDER — LEVOCETIRIZINE DIHYDROCHLORIDE 5 MG PO TABS: 5.0000 mg | ORAL_TABLET | Freq: Every evening | ORAL | Status: DC

## 2014-03-09 NOTE — Telephone Encounter (Signed)
Patient requesting a refill of levocetirizine (XYZAL) 5 MG tablet [[id number]][[id number]]

## 2014-03-17 ENCOUNTER — Other Ambulatory Visit: Payer: Self-pay | Admitting: Geriatric Medicine

## 2014-03-17 ENCOUNTER — Telehealth: Payer: Self-pay | Admitting: Internal Medicine

## 2014-03-17 NOTE — Telephone Encounter (Signed)
Patient is requesting refills on acetazolamide, klor con, levocetirizine and [facility]ate [facility] [phone] [facility] [place].  She is requesting 90 day supply.

## 2014-03-18 ENCOUNTER — Other Ambulatory Visit: Payer: Self-pay | Admitting: Geriatric Medicine

## 2014-03-18 DIAGNOSIS — E785 Hyperlipidemia, unspecified: Secondary | ICD-10-CM

## 2014-03-18 DIAGNOSIS — H47012 Ischemic optic neuropathy, left eye: Secondary | ICD-10-CM

## 2014-03-18 MED ORDER — ACETAZOLAMIDE 250 MG PO TABS: 250.0000 mg | ORAL_TABLET | Freq: Two times a day (BID) | ORAL | Status: DC

## 2014-03-18 MED ORDER — KLOR-CON M10 10 MEQ PO TBCR
10.0000 meq | EXTENDED_RELEASE_TABLET | Freq: Every day | ORAL | Status: DC
Start: 2014-03-18 — End: 2015-05-25

## 2014-03-18 MED ORDER — LEVOCETIRIZINE DIHYDROCHLORIDE 5 MG PO TABS
5.0000 mg | ORAL_TABLET | Freq: Every evening | ORAL | Status: DC
Start: 2014-03-18 — End: 2014-08-04

## 2014-03-18 MED ORDER — FENOFIBRATE 160 MG PO TABS: 160.0000 mg | ORAL_TABLET | Freq: Every day | ORAL | Status: DC

## 2014-03-18 NOTE — Telephone Encounter (Signed)
Sent to pharmacy 

## 2014-04-12 ENCOUNTER — Other Ambulatory Visit: Payer: Self-pay | Admitting: Neurology

## 2014-04-12 NOTE — Telephone Encounter (Signed)
Judie BonusElizabeth A Kollar, MD sent 1 year Rx on 03/18/14

## 2014-06-08 ENCOUNTER — Ambulatory Visit (INDEPENDENT_AMBULATORY_CARE_PROVIDER_SITE_OTHER): Payer: BLUE CROSS/BLUE SHIELD | Admitting: Internal Medicine

## 2014-06-08 ENCOUNTER — Other Ambulatory Visit (INDEPENDENT_AMBULATORY_CARE_PROVIDER_SITE_OTHER): Payer: BLUE CROSS/BLUE SHIELD

## 2014-06-08 ENCOUNTER — Encounter: Payer: Self-pay | Admitting: Internal Medicine

## 2014-06-08 VITALS — BP 118/78 | HR 78 | Temp 98.0°F | Ht <= 58 in | Wt 153.2 lb

## 2014-06-08 DIAGNOSIS — E669 Obesity, unspecified: Secondary | ICD-10-CM

## 2014-06-08 DIAGNOSIS — Z720 Tobacco use: Secondary | ICD-10-CM

## 2014-06-08 DIAGNOSIS — I10 Essential (primary) hypertension: Secondary | ICD-10-CM | POA: Diagnosis not present

## 2014-06-08 DIAGNOSIS — R635 Abnormal weight gain: Secondary | ICD-10-CM

## 2014-06-08 DIAGNOSIS — E663 Overweight: Secondary | ICD-10-CM | POA: Insufficient documentation

## 2014-06-08 LAB — HEMOGLOBIN A1C: Hgb A1c MFr Bld: 5.2 % (ref 4.6–6.5)

## 2014-06-08 LAB — BASIC METABOLIC PANEL
BUN: 25 mg/dL — ABNORMAL HIGH (ref 6–23)
CHLORIDE: 109 meq/L (ref 96–112)
CO2: 26 mEq/L (ref 19–32)
CREATININE: 0.87 mg/dL (ref 0.40–1.20)
Calcium: 9.8 mg/dL (ref 8.4–10.5)
GFR: 72.22 mL/min (ref >60.00)
Glucose, Bld: 93 mg/dL (ref 70–99)
Potassium: 3.4 mEq/L — ABNORMAL LOW (ref 3.5–5.1)
Sodium: 140 mEq/L (ref 135–145)

## 2014-06-08 LAB — TSH: TSH: 2.51 u[IU]/mL (ref 0.35–4.50)

## 2014-06-08 NOTE — Assessment & Plan Note (Signed)
We had stopped HCTZ due to the muscle cramps and those have resolved. She is doing well off the medicine. If she is not able to start exercising may need to add a different agent back. Check labs today.

## 2014-06-08 NOTE — Patient Instructions (Signed)
We will check your blood work today and call you back with the results.   If we do not find a good reason for the weight gain we can try you on a medicine called belviq (lorcaserin) which helps to encouraged weight loss.   Come back in about 3 months if we start the weight medicine to check to see if it is working.   To help lose weight you need to work on exercising about 4-5 times per week for 45 minutes per time.   Exercise to Lose Weight Exercise and a healthy diet may help you lose weight. Your doctor may suggest specific exercises. EXERCISE IDEAS AND TIPS  Choose low-cost things you enjoy doing, such as walking, bicycling, or exercising to workout videos.  Take stairs instead of the elevator.  Walk during your lunch break.  Park your car further away from work or school.  Go to a gym or an exercise class.  Start with 5 to 10 minutes of exercise each day. Build up to 30 minutes of exercise 4 to 6 days a week.  Wear shoes with good support and comfortable clothes.  Stretch before and after working out.  Work out until you breathe harder and your heart beats faster.  Drink extra water when you exercise.  Do not do so much that you hurt yourself, feel dizzy, or get very short of breath. Exercises that burn about 150 calories:  Running 1  miles in 15 minutes.  Playing volleyball for 45 to 60 minutes.  Washing and waxing a car for 45 to 60 minutes.  Playing touch football for 45 minutes.  Walking 1  miles in 35 minutes.  Pushing a stroller 1  miles in 30 minutes.  Playing basketball for 30 minutes.  Raking leaves for 30 minutes.  Bicycling 5 miles in 30 minutes.  Walking 2 miles in 30 minutes.  Dancing for 30 minutes.  Shoveling snow for 15 minutes.  Swimming laps for 20 minutes.  Walking up stairs for 15 minutes.  Bicycling 4 miles in 15 minutes.  Gardening for 30 to 45 minutes.  Jumping rope for 15 minutes.  Washing windows or floors for 45 to  60 minutes. Document Released: 02/10/2010 Document Revised: 04/02/2011 Document Reviewed: 02/10/2010 [facility] Patient Information 2015 [employer] [place]. This information is not intended to replace advice given to you by your health care provider. Make sure you discuss any questions you have with your health care provider.

## 2014-06-08 NOTE — Assessment & Plan Note (Signed)
Checking HgA1c and TSH and labs for weight gain. Talked to her about diet and the need for exercise 4-5 times per week for 45 minutes each time for weight loss. She will work on it. If no abnormality in labs will start belviq for weight loss. Would avoid phentermine given her history of stroke and high blood pressure.

## 2014-06-08 NOTE — Progress Notes (Signed)
   Subjective:    Patient ID: Stephanie Booker, female    DOB: 10/29/1960, 54 y.o.   MRN: [id number]  HPI The patient is a 54 YO female who is coming in today for follow up of her blood pressure. We had stopped her blood pressure medicine after last visit for her labs. She has not had problems with muscle cramps since stopping HCTZ. She is not currently on medicine. No problems with it at home when she has checked. She is concerned since her weight is up and she knows that can worsen blood pressure. She is not exercising much at all right now. She is concerned about the weight gain as she is trying to be reasonable with her diet and does not feel like it is helping.   Review of Systems  Constitutional: Positive for unexpected weight change. Negative for fever, activity change and appetite change.  Respiratory: Negative for cough, chest tightness, shortness of breath and wheezing.   Cardiovascular: Negative for chest pain, palpitations and leg swelling.  Gastrointestinal: Negative for abdominal pain, diarrhea, blood in stool and abdominal distention.  Musculoskeletal: Positive for myalgias. Negative for back pain, arthralgias and gait problem.  Neurological: Negative for dizziness, weakness, light-headedness and headaches.      Objective:   Physical Exam  Constitutional: She is oriented to person, place, and time. She appears well-developed and well-nourished.  Overweight  HENT:  Head: Normocephalic and atraumatic.  Eyes: EOM are normal.  Neck: Normal range of motion.  Cardiovascular: Normal rate and regular rhythm.   Pulmonary/Chest: Effort normal. No respiratory distress. She has no wheezes. She has no rales.  Abdominal: Soft. She exhibits no distension. There is no tenderness.  Musculoskeletal: She exhibits no edema.  Neurological: She is alert and oriented to person, place, and time. Coordination normal.  Skin: Skin is warm and dry.   Filed Vitals:   06/08/14 1019  BP: 118/78    Pulse: 78  Temp: 98 F (36.7 C)  TempSrc: Oral  Height: 4\' 10"  (1.473 m)  Weight: 153 lb 4 oz (69.514 kg)  SpO2: 97%      Assessment & Plan:

## 2014-06-08 NOTE — Assessment & Plan Note (Signed)
Still smoking and no intention to quit at this time. Talked to her about the risks and harms of cigarette smoke.

## 2014-06-08 NOTE — Progress Notes (Signed)
Pre visit review using our clinic review tool, if applicable. No additional management support is needed unless otherwise documented below in the visit note. 

## 2014-06-10 ENCOUNTER — Telehealth: Payer: Self-pay | Admitting: Internal Medicine

## 2014-06-10 NOTE — Telephone Encounter (Signed)
Patient is requesting call back in regards to results on recent lab work.  Also states that Dr. Dorise HissKollar gave her something else to take she would like to know if this was sent in.  Patient does not know the name of the med.  [facility] [place].

## 2014-06-10 NOTE — Telephone Encounter (Signed)
Spoke with patient. I will call her with her results when her labs have been reviewed.

## 2014-06-13 ENCOUNTER — Other Ambulatory Visit: Payer: Self-pay | Admitting: Neurology

## 2014-06-15 NOTE — Telephone Encounter (Signed)
Patient cannot be reached at home or on cell number. Person who answered said to try back tomorrow.

## 2014-06-15 NOTE — Telephone Encounter (Signed)
Patiet stated that the medication that was prescribed haven't been sent to the pharmacy, [facility] [address] [place]

## 2014-06-17 MED ORDER — LORCASERIN HCL 10 MG PO TABS: 10.0000 mg | ORAL_TABLET | Freq: Two times a day (BID) | ORAL | Status: DC

## 2014-06-17 NOTE — Telephone Encounter (Signed)
Mailed prescription and coupon card to patient.

## 2014-06-17 NOTE — Telephone Encounter (Signed)
Printed and signed belviq. Please mail to her with coupon card. Needs visit in 2-3 months for evaluation of efficacy.

## 2014-06-17 NOTE — Telephone Encounter (Signed)
Patient's labs came back normal and per your office note, patient can start a weight loss medication if no abnormalities in labs. Would you like to send a weight loss medication in for patient?

## 2014-06-23 ENCOUNTER — Telehealth: Payer: Self-pay | Admitting: Internal Medicine

## 2014-06-23 NOTE — Telephone Encounter (Signed)
Patient states she was given a discount card for velviq and [facility] [place].  Patient states that the discount card was for 2014 and pharmacy would not take.  Patient states pharmacy needs an updated card and that info could be faxed.  Patient states she is not home right now.

## 2014-07-05 NOTE — Telephone Encounter (Signed)
Patient wants to know if there is another diet pill she can take. She can't afford the one she has been prescribed.

## 2014-07-05 NOTE — Telephone Encounter (Signed)
Patient asked if you have a discount card for her it's for a diet pill  (she didn't know the name of it) to help pay for her it, because it is to expensive. Please advise

## 2014-07-05 NOTE — Telephone Encounter (Signed)
We do not have a discount card for that diet pill. Patient is aware.

## 2014-07-06 MED ORDER — ORLISTAT 60 MG PO CAPS: 60.0000 mg | ORAL_CAPSULE | Freq: Three times a day (TID) | ORAL | Status: DC

## 2014-07-06 NOTE — Telephone Encounter (Signed)
Patient aware.

## 2014-07-06 NOTE — Telephone Encounter (Signed)
She can try alli to see if she does okay with it. Take 1 pill with meals TID. Works to block the fat in your food and can cause some bloating and loose stools.

## 2014-07-13 ENCOUNTER — Other Ambulatory Visit: Payer: Self-pay | Admitting: Neurology

## 2014-08-04 ENCOUNTER — Other Ambulatory Visit: Payer: Self-pay

## 2014-08-04 MED ORDER — LEVOCETIRIZINE DIHYDROCHLORIDE 5 MG PO TABS: 5.0000 mg | ORAL_TABLET | Freq: Every evening | ORAL | Status: DC

## 2014-08-10 ENCOUNTER — Other Ambulatory Visit: Payer: Self-pay | Admitting: Geriatric Medicine

## 2014-08-10 MED ORDER — LEVOCETIRIZINE DIHYDROCHLORIDE 5 MG PO TABS: 5.0000 mg | ORAL_TABLET | Freq: Every evening | ORAL | Status: DC

## 2014-08-13 ENCOUNTER — Other Ambulatory Visit: Payer: Self-pay | Admitting: Geriatric Medicine

## 2014-08-13 MED ORDER — LEVOCETIRIZINE DIHYDROCHLORIDE 5 MG PO TABS: 5.0000 mg | ORAL_TABLET | Freq: Every evening | ORAL | Status: DC

## 2014-09-17 ENCOUNTER — Other Ambulatory Visit: Payer: Self-pay | Admitting: Neurology

## 2014-10-01 ENCOUNTER — Other Ambulatory Visit: Payer: Self-pay | Admitting: Neurology

## 2014-10-07 ENCOUNTER — Other Ambulatory Visit: Payer: Self-pay

## 2014-10-07 MED ORDER — ATORVASTATIN CALCIUM 20 MG PO TABS: ORAL_TABLET | ORAL | Status: DC

## 2014-10-20 ENCOUNTER — Other Ambulatory Visit: Payer: Self-pay | Admitting: Neurology

## 2014-10-21 ENCOUNTER — Telehealth: Payer: Self-pay | Admitting: *Deleted

## 2014-10-21 MED ORDER — ATORVASTATIN CALCIUM 20 MG PO TABS: ORAL_TABLET | ORAL | Status: DC

## 2014-10-21 NOTE — Telephone Encounter (Signed)
Rn call patients husband and told him wife lipitor was refill today.

## 2014-10-21 NOTE — Telephone Encounter (Signed)
Spoke to husband, who said to call pt at [phone] home #.  I called and LMVM for her to make appt with Dr. Roda Shutters, re: request for lipitor received from pharmacy.

## 2014-10-21 NOTE — Telephone Encounter (Signed)
[phone]  Pt has made an appt for 10/18 she is currently out of medication and is requesting a Rx until appt time.

## 2014-10-21 NOTE — Telephone Encounter (Signed)
Pt left msg on triage stating she is needing refills on her cholesterol med. called pt back spoke with lady stated she just left for work. Let msg [facility].../lmb

## 2014-10-25 ENCOUNTER — Telehealth: Payer: Self-pay | Admitting: *Deleted

## 2014-10-25 MED ORDER — FENOFIBRATE 160 MG PO TABS
160.0000 mg | ORAL_TABLET | Freq: Every day | ORAL | Status: DC
Start: 2014-10-25 — End: 2015-07-25

## 2014-10-25 NOTE — Telephone Encounter (Signed)
Left msg on triage requesting refills on her Fenofibrate. [facility].../lmb

## 2014-11-09 ENCOUNTER — Ambulatory Visit (INDEPENDENT_AMBULATORY_CARE_PROVIDER_SITE_OTHER): Payer: BLUE CROSS/BLUE SHIELD | Admitting: Neurology

## 2014-11-09 ENCOUNTER — Encounter: Payer: Self-pay | Admitting: Neurology

## 2014-11-09 VITALS — BP 96/72 | HR 73 | Ht <= 58 in | Wt 137.4 lb

## 2014-11-09 DIAGNOSIS — H47012 Ischemic optic neuropathy, left eye: Secondary | ICD-10-CM

## 2014-11-09 DIAGNOSIS — G932 Benign intracranial hypertension: Secondary | ICD-10-CM | POA: Diagnosis not present

## 2014-11-09 DIAGNOSIS — G5603 Carpal tunnel syndrome, bilateral upper limbs: Secondary | ICD-10-CM | POA: Diagnosis not present

## 2014-11-09 DIAGNOSIS — F17200 Nicotine dependence, unspecified, uncomplicated: Secondary | ICD-10-CM

## 2014-11-09 NOTE — Patient Instructions (Addendum)
-   continue ASA and lipitor for stroke prevention - continue lipitor and fenofibrate for HLD - continue diamox 250mg  bid - continue follow up with ophthalmologist   - smoking cessation counseling provided again. - Follow up with your primary care physician for stroke risk factor modification. Recommend maintain blood pressure goal <130/80, diabetes with hemoglobin A1c goal below 6.5% and lipids with LDL cholesterol goal below 70 mg/dL.  - follow up as needed.

## 2014-11-09 NOTE — Progress Notes (Signed)
STROKE NEUROLOGY FOLLOW UP NOTE  NAME: Stephanie Booker DOB: 07/18/1960  REASON FOR VISIT: stroke follow up HISTORY FROM: pt and chart  Today we had the pleasure of seeing Stephanie Booker in follow-up at our [facility]rology [facility]. Pt was accompanied by husband.   History Summary Stephanie Booker is an 54 y.o. female with PMH of HTN, HLD, current smoker, estrogen use who was admitted on 6/22 for left side vision loss. She stated that on 6/21 at night she began to have chest pain, denies any SOB, HA, vision change, weakness or numbness. She went to bed and on awakening the second day she had difficulty with vision in the left eye. She stated that she can see from left eye the lower part and outer part of the visual field but not middle and upper parts. She went to see her PCP and was directed to present to the ED for further evaluation. Her right eye vision and visual field were intact. She had MRI brian which was negative for stroke although limited study and with artifact. Her CUS and 2D echo unremarkable. However, her TG and cholesterol were high. ESR was 20. She was discharged home with ASA and lipitor and fenofibrate.   08/19/13 follow up - the patient has been doing well. She stated that her visual field on the left eye improved some, she is able to see more on the upper visual field but still has central part of vision loss on the left eye. She went to see ophthalmologist last week and was told that her left eye swollen fundus, consistent with stroke.  She has cut down her smoking ana on chantix. She has stopped estrogen since the hospitalization.   10/15/13 follow up - she had follow up with ophthalmologist Dr. 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 LP done clear but opening pressure 28. She was given diamox $RemoveBefo'250mg'CVKRUKplZRF$  bid. After LP, she had post LP HA and was treated with hydration and bedrest. The HA lasted about 2  days and subsided. She did not feel much different after taking diamox. Her left eye central vision deficit may be slightly better but denies HA or N/V. She followed up with Dr. Manson Allan again two weeks ago and was told fundi look a little better. She will see her again next week. Denies any side effect from diamox so far. She is still smoking and not quit yet. She said chantix not helping too much.   02/03/14 follow up - pt was doing the same. Left eye visual deficit remained the same. Denies headache. She followed up with ophthalmologist and was told left eye papilledema getting better. She complains of L>R hand tingling and throbbing pain at first 4 fingertips. Constant day and night, sometimes not able to open the jar affecting daily activities. 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 She has to take some advil sometimes for pain control.  Interval History During the interval time, pt was doing well. Had left CTS surgery in spring and much improved. Right CTS with only minimal symptoms. No intervention needed. Following with ophthal and no more improvement. Continue smoking, not quit yet.  REVIEW OF SYSTEMS: Full 14 system review of systems performed and notable only for those listed below and in HPI above, all others are negative:  Constitutional: N/A  Cardiovascular: N/A  Ear/Nose/Throat: N/A  Skin: N/A  Eyes:  loss of vision Respiratory: N/A  Gastroitestinal: N/A  Genitourinary: N/A Hematology/Lymphatic: N/A  Endocrine: N/A  Musculoskeletal: N/A  Allergy/Immunology: N/A  Neurological: restless leg, snoring Psychiatric: N/A  The following represents the patient's updated allergies and side effects list: No Known Allergies  Labs since last visit of relevance include the following: Results for orders placed or performed in visit on 06/08/14  TSH  Result Value Ref Range   TSH 2.51 0.35 - 4.50 uIU/mL  HgB A1c  Result Value Ref Range   Hgb A1c MFr Bld 5.2 4.6 - 6.5 %    Basic Metabolic Panel (BMET)  Result Value Ref Range   Sodium 140 135 - 145 mEq/L   Potassium 3.4 (L) 3.5 - 5.1 mEq/L   Chloride 109 96 - 112 mEq/L   CO2 26 19 - 32 mEq/L   Glucose, Bld 93 70 - 99 mg/dL   BUN 25 (H) 6 - 23 mg/dL   Creatinine, Ser 0.87 0.40 - 1.20 mg/dL   Calcium 9.8 8.4 - 10.5 mg/dL   GFR 72.22 >60.00 mL/min    The neurologically relevant items on the patient's problem list were reviewed on today's visit.  Neurologic Examination  A problem focused neurological exam (12 or more points of the single system neurologic examination, vital signs counts as 1 point, cranial nerves count for 8 points) was performed.  Blood pressure 96/72, pulse 73, height $RemoveBe'4\' 10"'SlKytpHyX$  (1.473 m), weight 137 lb 6.4 oz (62.324 kg).  General - Well nourished, well developed, in no apparent distress.  Ophthalmologic - bilaterally sharp edge at temporal part of the discs but blurry on the nasal parts of the discs. No change from last visit.  Cardiovascular - Regular rate and rhythm with no murmur.  Mental Status -  Level of arousal and orientation to time, place, and person were intact. Language including expression, naming, repetition, comprehension, reading, and writing was assessed and found intact. Attention span and concentration were normal. Recent and remote memory were intact. Fund of Knowledge was assessed and was intact.  Cranial Nerves II - XII - II - Vision intact OU. Left eye central visual field defect not change from last visit. Right eye visual field full. III, IV, VI - Extraocular movements intact. V - Facial sensation intact bilaterally. VII - Facial movement intact bilaterally. VIII - Hearing & vestibular intact bilaterally. X - Palate elevates symmetrically. XI - Chin turning & shoulder shrug intact bilaterally. XII - Tongue protrusion intact.  Motor Strength - The patient's strength was normal in all extremities and pronator drift was absent.  Bulk was normal and  fasciculations were absent.   Motor Tone - Muscle tone was assessed at the neck and appendages and was normal.  Reflexes - The patient's reflexes were normal in all extremities and she had no pathological reflexes.  Sensory - Light touch, temperature/pinprick, vibration and proprioception, and Romberg testing were assessed and were normal.    Coordination - The patient had normal movements in the hands and feet with no ataxia or dysmetria.  Tremor was absent.  Gait and Station - The patient's transfers, posture, gait, station, and turns were observed as normal. Phalen's and Tinel tests were negative.   Data reviewed: I personally reviewed the images and agree with the radiology interpretations.  Mr Brain Wo Contrast  07/14/2013  Incomplete examination as above. No definite acute infarct identified. Abnormal diffusion weighted signal in the left occipital lobe is not confirmed on axial images and may be artifactual.  MRI  and MRV brain  09/17/13 Exam quality limited by motion  Negative MRI of the brain  Negative MR venogram of the head.  CT of the brain  1. Normal head CT with no acute intracranial abnormality identified.  2. Right sphenoid sinus disease.  2D Echocardiogram  - Left ventricle: The cavity size was normal. Systolic function was normal. The estimated ejection fraction was in the range of 55% to 60%. Wall motion was normal; there were no regional wall motion abnormalities. - Atrial septum: No defect or patent foramen ovale was identified. Carotid Doppler  Prelim: <39% stenosis bilat  CXR  No active cardiopulmonary disease  EKG .  Sinus rhythm  Borderline T abnormalities, anterior leads  Baseline wander in lead(s) V3 V4 V5 V6  Component     Latest Ref Rng 07/14/2013  Cholesterol     0 - 200 mg/dL 212 (H)  Triglycerides     <150 mg/dL 531 (H)  HDL     >39 mg/dL 29 (L)  Total CHOL/HDL Ratio      7.3  VLDL     0 - 40 mg/dL UNABLE TO CALCULATE IF TRIGLYCERIDE OVER  400 mg/dL  LDL (calc)     0 - 99 mg/dL UNABLE TO CALCULATE IF TRIGLYCERIDE OVER 400 mg/dL  Hemoglobin A1C     <5.7 % 5.3  Mean Plasma Glucose     <117 mg/dL 105  Sed Rate     0 - 22 mm/hr 20   LP 09/17/13 On the first attempt, clear cerebral spinal fluid was obtained. Opening pressure was 28. 16 mL of CSF was therefore collected for therapeutic treatment of elevated intracranial pressure. Closing pressure was 9.  Component     Latest Ref Rng 09/17/2013         7:49 PM  Tube #      4  Color, CSF     COLORLESS COLORLESS  Appearance, CSF     CLEAR CLEAR  Supernatant      NOT INDICATED  RBC Count, CSF     0 /cu mm 13 (H)  WBC, CSF     0 - 5 /cu mm 1  Segmented Neutrophils-CSF     0 - 6 %   Lymphs, CSF     40 - 80 % RARE  Monocyte-Macrophage-Spinal Fluid     15 - 45 % RARE  Other Cells, CSF      TOO FEW TO COUNT, SMEAR AVAILABLE FOR REVIEW  Specimen Description      CSF  Special Requests      NONE  Gram Stain      CYTOSPUN . . .  Culture        Report Status      09/17/2013 FINAL  Glucose, CSF     43 - 76 mg/dL 78 (H)  Total  Protein, CSF     15 - 45 mg/dL 32   Component     Latest Ref Rng 09/17/2013         7:49 PM  Tube #      1  Color, CSF     COLORLESS COLORLESS  Appearance, CSF     CLEAR CLEAR  Supernatant      NOT INDICATED  RBC Count, CSF     0 /cu mm 54 (H)  WBC, CSF     0 - 5 /cu mm 1  Segmented Neutrophils-CSF     0 - 6 % RARE  Lymphs, CSF  40 - 80 % RARE  Monocyte-Macrophage-Spinal Fluid     15 - 45 % RARE  Other Cells, CSF      TOO FEW TO COUNT, SMEAR AVAILABLE FOR REVIEW  Specimen Description      CSF  Special Requests      NONE  Gram Stain      WBC PRESENT, PREDOMINANTLY PMN . . .  Culture      NO GROWTH 3 DAYS . . .  Report Status      09/20/2013 FINAL  Glucose, CSF     43 - 76 mg/dL   Total  Protein, CSF     15 - 45 mg/dL     Assessment: Stephanie Booker is a 44 y.o. Caucasian female with PMH of HTN, HLD, smoker, estrogen  use presented to hospital in 06/2013 for left side partial vision loss at the central vision. Other visual field intact. MRI negative for stroke in 06/2013. She has multiple risk factors for stroke and vasculopathy. 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 Follow up with ophth and felt bilateral papilledema, repeat MRI and add MRV which were negative. LP showed slightly high OP 28 and started on diamox low dose for possible IIH treatment. Followed up with ophthal, was told normal fundi on the last visit. She continues with ASA and lipitor for stroke prevention. 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 Follow up with PCP for stroke risk factor modification. Has not quit smoking yet.  Plan:  - continue ASA and lipitor for stroke prevention - continue lipitor and fenofibrate for HLD - continue diamox $RemoveBeforeD'250mg'TpgPxmUlwzrvNn$  bid - continue follow up with ophthalmologist   - smoking cessation counseling provided again. - Follow up with your primary care physician for stroke risk factor modification. Recommend maintain blood pressure goal <130/80, diabetes with hemoglobin A1c goal below 6.5% and lipids with LDL cholesterol goal below 70 mg/dL.   - RTC PRN.  No orders of the defined types were placed in this encounter.    Patient Instructions  - continue ASA and lipitor for stroke prevention - continue lipitor and fenofibrate for HLD - continue diamox $RemoveBeforeD'250mg'iYhVAnfvXoTMcv$  bid - continue follow up with ophthalmologist   - smoking cessation counseling provided again. - Follow up with your primary care physician for stroke risk factor modification. Recommend maintain blood pressure goal <130/80, diabetes with hemoglobin A1c goal below 6.5% and lipids with LDL cholesterol goal below 70 mg/dL.  - follow up as needed.    Rosalin Hawking, MD PhD [facility] [address] [place], [place] [address] [phone]

## 2014-11-10 DIAGNOSIS — G5603 Carpal tunnel syndrome, bilateral upper limbs: Secondary | ICD-10-CM | POA: Insufficient documentation

## 2015-02-28 ENCOUNTER — Other Ambulatory Visit (INDEPENDENT_AMBULATORY_CARE_PROVIDER_SITE_OTHER): Payer: BLUE CROSS/BLUE SHIELD

## 2015-02-28 ENCOUNTER — Encounter: Payer: Self-pay | Admitting: Neurology

## 2015-02-28 ENCOUNTER — Ambulatory Visit (INDEPENDENT_AMBULATORY_CARE_PROVIDER_SITE_OTHER): Payer: BLUE CROSS/BLUE SHIELD | Admitting: Neurology

## 2015-02-28 ENCOUNTER — Ambulatory Visit (INDEPENDENT_AMBULATORY_CARE_PROVIDER_SITE_OTHER): Payer: BLUE CROSS/BLUE SHIELD | Admitting: Internal Medicine

## 2015-02-28 ENCOUNTER — Encounter: Payer: Self-pay | Admitting: Internal Medicine

## 2015-02-28 VITALS — BP 114/74 | HR 94 | Ht 58.5 in | Wt 146.0 lb

## 2015-02-28 VITALS — BP 116/68 | HR 96 | Temp 98.7°F | Resp 18 | Ht 58.5 in | Wt 144.0 lb

## 2015-02-28 DIAGNOSIS — Z8673 Personal history of transient ischemic attack (TIA), and cerebral infarction without residual deficits: Secondary | ICD-10-CM | POA: Diagnosis not present

## 2015-02-28 DIAGNOSIS — F4323 Adjustment disorder with mixed anxiety and depressed mood: Secondary | ICD-10-CM | POA: Diagnosis not present

## 2015-02-28 DIAGNOSIS — Z23 Encounter for immunization: Secondary | ICD-10-CM

## 2015-02-28 DIAGNOSIS — I10 Essential (primary) hypertension: Secondary | ICD-10-CM

## 2015-02-28 DIAGNOSIS — G5603 Carpal tunnel syndrome, bilateral upper limbs: Secondary | ICD-10-CM

## 2015-02-28 DIAGNOSIS — G932 Benign intracranial hypertension: Secondary | ICD-10-CM

## 2015-02-28 DIAGNOSIS — Z0000 Encounter for general adult medical examination without abnormal findings: Secondary | ICD-10-CM | POA: Diagnosis not present

## 2015-02-28 DIAGNOSIS — H47012 Ischemic optic neuropathy, left eye: Secondary | ICD-10-CM

## 2015-02-28 DIAGNOSIS — G451 Carotid artery syndrome (hemispheric): Secondary | ICD-10-CM | POA: Diagnosis not present

## 2015-02-28 DIAGNOSIS — F17200 Nicotine dependence, unspecified, uncomplicated: Secondary | ICD-10-CM

## 2015-02-28 DIAGNOSIS — Z720 Tobacco use: Secondary | ICD-10-CM

## 2015-02-28 DIAGNOSIS — E785 Hyperlipidemia, unspecified: Secondary | ICD-10-CM | POA: Diagnosis not present

## 2015-02-28 LAB — CBC
HCT: 40.1 % (ref 36.0–46.0)
Hemoglobin: 13.6 g/dL (ref 12.0–15.0)
MCHC: 34 g/dL (ref 30.0–36.0)
MCV: 89 fl (ref 78.0–100.0)
PLATELETS: 285 10*3/uL (ref 150.0–400.0)
RBC: 4.5 Mil/uL (ref 3.87–5.11)
RDW: 13 % (ref 11.5–15.5)
WBC: 8.6 10*3/uL (ref 4.0–10.5)

## 2015-02-28 LAB — COMPREHENSIVE METABOLIC PANEL
ALK PHOS: 70 U/L (ref 39–117)
ALT: 12 U/L (ref 0–35)
AST: 13 U/L (ref 0–37)
Albumin: 4.2 g/dL (ref 3.5–5.2)
BUN: 21 mg/dL (ref 6–23)
CO2: 23 mEq/L (ref 19–32)
Calcium: 9.2 mg/dL (ref 8.4–10.5)
Chloride: 110 mEq/L (ref 96–112)
Creatinine, Ser: 0.83 mg/dL (ref 0.40–1.20)
GFR: 76.05 mL/min (ref >60.00)
GLUCOSE: 69 mg/dL — AB (ref 70–99)
POTASSIUM: 3.6 meq/L (ref 3.5–5.1)
Sodium: 141 mEq/L (ref 135–145)
TOTAL PROTEIN: 7.2 g/dL (ref 6.0–8.3)
Total Bilirubin: 0.3 mg/dL (ref 0.2–1.2)

## 2015-02-28 LAB — LIPID PANEL
CHOL/HDL RATIO: 4
Cholesterol: 174 mg/dL (ref 0–200)
HDL: 39.3 mg/dL (ref >39.00)
LDL Cholesterol: 100 mg/dL — ABNORMAL HIGH (ref 0–99)
NONHDL: 134.67
Triglycerides: 171 mg/dL — ABNORMAL HIGH (ref 0.0–149.0)
VLDL: 34.2 mg/dL (ref 0.0–40.0)

## 2015-02-28 LAB — HEMOGLOBIN A1C: Hgb A1c MFr Bld: 5.3 % (ref 4.6–6.5)

## 2015-02-28 MED ORDER — ESCITALOPRAM OXALATE 10 MG PO TABS: 10.0000 mg | ORAL_TABLET | Freq: Every day | ORAL | Status: DC

## 2015-02-28 MED ORDER — PHENTERMINE HCL 37.5 MG PO CAPS: 37.5000 mg | ORAL_CAPSULE | ORAL | Status: DC

## 2015-02-28 MED ORDER — DICLOFENAC SODIUM 1 % TD GEL: 2.0000 g | Freq: Four times a day (QID) | TRANSDERMAL | Status: DC

## 2015-02-28 NOTE — Progress Notes (Signed)
Pre visit review using our clinic review tool, if applicable. No additional management support is needed unless otherwise documented below in the visit note. 

## 2015-02-28 NOTE — Progress Notes (Signed)
   Subjective:    Patient ID: Stephanie Booker, female    DOB: 04-09-1960, 55 y.o.   MRN: [id number]  HPI The patient is a 55 YO female coming in for wellness. Some right knee pain and mild anxiety new from last year.   PMH, [facility], social history reviewed and updated at visit.   Review of Systems  Constitutional: Positive for unexpected weight change. Negative for fever, activity change and appetite change.  HENT: Negative.   Eyes: Negative.   Respiratory: Negative for cough, chest tightness, shortness of breath and wheezing.   Cardiovascular: Negative for chest pain, palpitations and leg swelling.  Gastrointestinal: Negative for abdominal pain, diarrhea, blood in stool and abdominal distention.  Musculoskeletal: Positive for arthralgias. Negative for myalgias, back pain and gait problem.  Skin: Negative.   Neurological: Negative for dizziness, weakness, light-headedness and headaches.  Psychiatric/Behavioral: Positive for dysphoric mood, decreased concentration and agitation. The patient is nervous/anxious.       Objective:   Physical Exam  Constitutional: She is oriented to person, place, and time. She appears well-developed and well-nourished.  Overweight  HENT:  Head: Normocephalic and atraumatic.  Eyes: EOM are normal.  Neck: Normal range of motion.  Cardiovascular: Normal rate and regular rhythm.   Pulmonary/Chest: Effort normal. No respiratory distress. She has no wheezes. She has no rales.  Abdominal: Soft. She exhibits no distension. There is no tenderness.  Musculoskeletal: She exhibits no edema.  Neurological: She is alert and oriented to person, place, and time. Coordination normal.  Skin: Skin is warm and dry.  Psychiatric:  Slightly anxious on exam   Filed Vitals:   02/28/15 1012  BP: 116/68  Pulse: 96  Temp: 98.7 F (37.1 C)  TempSrc: Oral  Resp: 18  Height: 4' 10.5" (1.486 m)  Weight: 144 lb (65.318 kg)  SpO2: 98%      Assessment & Plan:  Tdap given at  visit

## 2015-02-28 NOTE — Patient Instructions (Signed)
-   continue ASA and lipitor for stroke prevention - continue lipitor and fenofibrate for HLD - continue diamox  bid - continue follow up with ophthalmologist  - will check MRI and MRA, carotid doppler and EEG for evaluate of speech difficulty spells. - quit smoking - Follow up with your primary care physician for stroke risk factor modification. Recommend maintain blood pressure goal <130/80, diabetes with hemoglobin A1c goal below 6.5% and lipids with LDL cholesterol goal below 70 mg/dL.  - follow up in 2 months.

## 2015-02-28 NOTE — Patient Instructions (Addendum)
It is okay to stop the biotin and the vitamin A unless you think they are helping.   Start taking 1/2 of the estradiol pill for the next 1 month. If you are doing well, go to taking 1/2 pill every other day for 1 month.   With the effexor I would recommend to start taking only the 150 mg pill (stop taking the 75 mg pill) for the next 1 week, then stop taking the 150 mg pill and start taking 75 mg pill daily for 1 week.   We are sending in a new medicine to help with the nerves that you can start taking once you are taking the 75 mg pill.   Come back in about 2 months so we can see how you are doing with the medicines.   We have given you a gel called voltaren to use on the knee until you come back with Dr. Katrinka Blazing for the shot in your knee.   Low potassium Hypokalemia means that the amount of potassium in the blood is lower than normal.Potassium is a chemical, called an electrolyte, that helps regulate the amount of fluid in the body. It also stimulates muscle contraction and helps nerves function properly.Most of the body's potassium is inside of cells, and only a very small amount is in the blood. Because the amount in the blood is so small, minor changes can be life-threatening.  TREATMENT Hypokalemia can be treated with potassium supplements taken by mouth or adjustments in your current medicines. If your potassium level is very low, you may need to get potassium through a vein (IV) and be monitored in the hospital. A diet high in potassium is also helpful. Foods high in potassium are:  Nuts, such as peanuts and pistachios.  Seeds, such as sunflower seeds and pumpkin seeds.  Peas, lentils, and lima beans.  Whole grain and bran cereals and breads.  Fresh fruit and vegetables, such as apricots, avocado, bananas, cantaloupe, kiwi, oranges, tomatoes, asparagus, and potatoes.  Orange and tomato juices.  Red meats.  Fruit yogurt.

## 2015-02-28 NOTE — Progress Notes (Signed)
STROKE NEUROLOGY FOLLOW UP NOTE  NAME: Stephanie Booker DOB: 05/06/1960  REASON FOR VISIT: stroke follow up HISTORY FROM: pt and chart  Today we had the pleasure of seeing Stephanie Booker in follow-up at our [facility]. Pt was accompanied by husband.   History Summary Stephanie Booker is an 55 y.o. female with PMH of HTN, HLD, current smoker, estrogen use who was admitted on 6/22 for left side vision loss. She stated that on 6/21 at night she began to have chest pain, denies any SOB, HA, vision change, weakness or numbness. She went to bed and on awakening the second day she had difficulty with vision in the left eye. She stated that she can see from left eye the lower part and outer part of the visual field but not middle and upper parts. She went to see her PCP and was directed to present to the ED for further evaluation. Her right eye vision and visual field were intact. She had MRI brian which was negative for stroke although limited study and with artifact. Her CUS and 2D echo unremarkable. However, her TG and cholesterol were high. ESR was 20. She was discharged home with ASA and lipitor and fenofibrate.   08/19/13 follow up - the patient has been doing well. She stated that her visual field on the left eye improved some, she is able to see more on the upper visual field but still has central part of vision loss on the left eye. She went to see ophthalmologist last week and was told that her left eye swollen fundus, consistent with stroke.  She has cut down her smoking ana on chantix. She has stopped estrogen since the hospitalization.   10/15/13 follow up - she had follow up with ophthalmologist Dr. 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 LP done clear but opening pressure 28. She was given diamox $RemoveBefo'250mg'HRECljoioYI$  bid. After LP, she had post LP HA and was treated with hydration and bedrest. The HA lasted about 2  days and subsided. She did not feel much different after taking diamox. Her left eye central vision deficit may be slightly better but denies HA or N/V. She followed up with Dr. Manson Allan again two weeks ago and was told fundi look a little better. She will see her again next week. Denies any side effect from diamox so far. She is still smoking and not quit yet. She said chantix not helping too much.   02/03/14 follow up - pt was doing the same. Left eye visual deficit remained the same. Denies headache. She followed up with ophthalmologist and was told left eye papilledema getting better. She complains of L>R hand tingling and throbbing pain at first 4 fingertips. Constant day and night, sometimes not able to open the jar affecting daily activities. 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 She has to take some advil sometimes for pain control.  11/09/14 follow up - pt was doing well. Had left CTS surgery in spring and much improved. Right CTS with only minimal symptoms. No intervention needed. Following with ophthal and no more improvement. Continue smoking, not quit yet.  Interval History During the interval time, pt has been doing well. However, husband complained that for the last 1-2 months, she occasionally has speech hesitation and slurry words for about 5-10 sec and then back to normal. Denies LOC but not  sure if there was any blank staring and pt not remember what happened for the 5-10 sec. Went to see PCP today and checked lipid panel showed improved TG from 242 down to 171, but LDL from 80 up to 100. Her vision has no changes, still has left central vision loss. Denies HA.  Pt also has right knee pain and difficulty with walking, PCP referred to orthopedics later this month.  REVIEW OF SYSTEMS: Full 14 system review of systems performed and notable only for those listed below and in HPI above, all others are negative:  Constitutional: N/A  Cardiovascular: N/A  Ear/Nose/Throat: N/A    Skin: N/A  Eyes: loss of vision Respiratory: N/A  Gastroitestinal: N/A  Genitourinary: N/A Hematology/Lymphatic: N/A  Endocrine: N/A  Musculoskeletal: joint pain Allergy/Immunology: N/A  Neurological:  Psychiatric: N/A  The following represents the patient's updated allergies and side effects list: No Known Allergies  Labs since last visit of relevance include the following: Results for orders placed or performed in visit on 02/28/15  Lipid panel  Result Value Ref Range   Cholesterol 174 0 - 200 mg/dL   Triglycerides 171.0 (H) 0.0 - 149.0 mg/dL   HDL 39.30 >39.00 mg/dL   VLDL 34.2 0.0 - 40.0 mg/dL   LDL Cholesterol 100 (H) 0 - 99 mg/dL   Total CHOL/HDL Ratio 4    NonHDL 134.67   Comp Met (CMET)  Result Value Ref Range   Sodium 141 135 - 145 mEq/L   Potassium 3.6 3.5 - 5.1 mEq/L   Chloride 110 96 - 112 mEq/L   CO2 23 19 - 32 mEq/L   Glucose, Bld 69 (L) 70 - 99 mg/dL   BUN 21 6 - 23 mg/dL   Creatinine, Ser 0.83 0.40 - 1.20 mg/dL   Total Bilirubin 0.3 0.2 - 1.2 mg/dL   Alkaline Phosphatase 70 39 - 117 U/L   AST 13 0 - 37 U/L   ALT 12 0 - 35 U/L   Total Protein 7.2 6.0 - 8.3 g/dL   Albumin 4.2 3.5 - 5.2 g/dL   Calcium 9.2 8.4 - 10.5 mg/dL   GFR 76.05 >60.00 mL/min  HgB A1c  Result Value Ref Range   Hgb A1c MFr Bld 5.3 4.6 - 6.5 %  CBC  Result Value Ref Range   WBC 8.6 4.0 - 10.5 K/uL   RBC 4.50 3.87 - 5.11 Mil/uL   Platelets 285.0 150.0 - 400.0 K/uL   Hemoglobin 13.6 12.0 - 15.0 g/dL   HCT 40.1 36.0 - 46.0 %   MCV 89.0 78.0 - 100.0 fl   MCHC 34.0 30.0 - 36.0 g/dL   RDW 13.0 11.5 - 15.5 %    The neurologically relevant items on the patient's problem list were reviewed on today's visit.  Neurologic Examination  A problem focused neurological exam (12 or more points of the single system neurologic examination, vital signs counts as 1 point, cranial nerves count for 8 points) was performed.  Blood pressure 114/74, pulse 94, height 4' 10.5" (1.486 m), weight  146 lb (66.225 kg).  General - Well nourished, well developed, in no apparent distress.  Ophthalmologic - sharp edge at temporal part of right optic disc but nasal part difficult to visualize. Left optic disc sharp without papilledema.   Cardiovascular - Regular rate and rhythm with no murmur.  musculoskelental -  Right knee pain  Mental Status -  Level of arousal and orientation to time, place, and person were intact. Language including expression,  naming, repetition, comprehension, reading, and writing was assessed and found intact. Attention span and concentration were normal. Recent and remote memory were intact. Fund of Knowledge was assessed and was intact.  Cranial Nerves II - XII - II - Vision intact OU. Left eye central visual field defect not change from last visit. Right eye visual field full. III, IV, VI - Extraocular movements intact. V - Facial sensation intact bilaterally. VII - Facial movement intact bilaterally. VIII - Hearing & vestibular intact bilaterally. X - Palate elevates symmetrically. XI - Chin turning & shoulder shrug intact bilaterally. XII - Tongue protrusion intact.  Motor Strength - The patient's strength was normal in all extremities and pronator drift was absent.  Bulk was normal and fasciculations were absent.   Motor Tone - Muscle tone was assessed at the neck and appendages and was normal.  Reflexes - The patient's reflexes were normal in all extremities and she had no pathological reflexes.  Sensory - Light touch, temperature/pinprick, vibration and proprioception, and Romberg testing were assessed and were normal.    Coordination - The patient had normal movements in the hands and feet with no ataxia or dysmetria.  Tremor was absent.  Gait and Station - difficulty in walking with right knee pain   Data reviewed: I personally reviewed the images and agree with the radiology interpretations.  Mr Brain Wo Contrast  07/14/2013  Incomplete  examination as above. No definite acute infarct identified. Abnormal diffusion weighted signal in the left occipital lobe is not confirmed on axial images and may be artifactual.  MRI and MRV brain  09/17/13 Exam quality limited by motion  Negative MRI of the brain  Negative MR venogram of the head.  CT of the brain  1. Normal head CT with no acute intracranial abnormality identified.  2. Right sphenoid sinus disease.  2D Echocardiogram  - Left ventricle: The cavity size was normal. Systolic function was normal. The estimated ejection fraction was in the range of 55% to 60%. Wall motion was normal; there were no regional wall motion abnormalities. - Atrial septum: No defect or patent foramen ovale was identified. Carotid Doppler  Prelim: <39% stenosis bilat  CXR  No active cardiopulmonary disease  EKG .  Sinus rhythm  Borderline T abnormalities, anterior leads  Baseline wander in lead(s) V3 V4 V5 V6  Component     Latest Ref Rng 07/14/2013  Cholesterol     0 - 200 mg/dL 212 (H)  Triglycerides     <150 mg/dL 531 (H)  HDL     >39 mg/dL 29 (L)  Total CHOL/HDL Ratio      7.3  VLDL     0 - 40 mg/dL UNABLE TO CALCULATE IF TRIGLYCERIDE OVER 400 mg/dL  LDL (calc)     0 - 99 mg/dL UNABLE TO CALCULATE IF TRIGLYCERIDE OVER 400 mg/dL  Hemoglobin A1C     <5.7 % 5.3  Mean Plasma Glucose     <117 mg/dL 105  Sed Rate     0 - 22 mm/hr 20   LP 09/17/13 On the first attempt, clear cerebral spinal fluid was obtained. Opening pressure was 28. 16 mL of CSF was therefore collected for therapeutic treatment of elevated intracranial pressure. Closing pressure was 9.  Component     Latest Ref Rng 09/17/2013         7:49 PM  Tube #      4  Color, CSF     COLORLESS COLORLESS  Appearance, CSF  CLEAR CLEAR  Supernatant      NOT INDICATED  RBC Count, CSF     0 /cu mm 13 (H)  WBC, CSF     0 - 5 /cu mm 1  Segmented Neutrophils-CSF     0 - 6 %   Lymphs, CSF     40 - 80 % RARE    Monocyte-Macrophage-Spinal Fluid     15 - 45 % RARE  Other Cells, CSF      TOO FEW TO COUNT, SMEAR AVAILABLE FOR REVIEW  Specimen Description      CSF  Special Requests      NONE  Gram Stain      CYTOSPUN . . .  Culture        Report Status      09/17/2013 FINAL  Glucose, CSF     43 - 76 mg/dL 78 (H)  Total  Protein, CSF     15 - 45 mg/dL 32   Component     Latest Ref Rng 09/17/2013         7:49 PM  Tube #      1  Color, CSF     COLORLESS COLORLESS  Appearance, CSF     CLEAR CLEAR  Supernatant      NOT INDICATED  RBC Count, CSF     0 /cu mm 54 (H)  WBC, CSF     0 - 5 /cu mm 1  Segmented Neutrophils-CSF     0 - 6 % RARE  Lymphs, CSF     40 - 80 % RARE  Monocyte-Macrophage-Spinal Fluid     15 - 45 % RARE  Other Cells, CSF      TOO FEW TO COUNT, SMEAR AVAILABLE FOR REVIEW  Specimen Description      CSF  Special Requests      NONE  Gram Stain      WBC PRESENT, PREDOMINANTLY PMN . . .  Culture      NO GROWTH 3 DAYS . . .  Report Status      09/20/2013 FINAL  Glucose, CSF     43 - 76 mg/dL   Total  Protein, CSF     15 - 45 mg/dL     Assessment: Stephanie Booker is a 6 y.o. Caucasian female with PMH of HTN, HLD, smoker, estrogen use presented to hospital in 06/2013 for left side partial vision loss at the central vision. Other visual field intact. MRI negative for stroke in 06/2013. She has multiple risk factors for stroke and vasculopathy. 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 Follow up with ophth and felt bilateral papilledema, repeat MRI and add MRV which were negative. LP showed slightly high OP 28 and started on diamox low dose for possible IIH treatment. Followed up with ophthal, was told normal fundi on the last visit. She continues with ASA and lipitor for stroke prevention. 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 Not able to quit smoking.  During interval time, pt had occasional slurry speech, speech  hesitancy, not sure if there is any blank staring, short lasting. Could be due to fatigue, tiredness, mild encephalopathy, but TIA or EEG needs to be ruled out. Will do MRI, MRA brain, EEG and CUS. Fundi benign, continue diamox and follow up with ophthalmology.   Plan:  - continue ASA and lipitor for stroke prevention - continue lipitor and fenofibrate for HLD - continue diamox '250mg'$  bid - continue  follow up with ophthalmologist  - will check MRI and MRA, carotid doppler and EEG for evaluate of speech difficulty spells. - quit smoking - Follow up with your primary care physician for stroke risk factor modification. Recommend maintain blood pressure goal <130/80, diabetes with hemoglobin A1c goal below 6.5% and lipids with LDL cholesterol goal below 70 mg/dL.  - follow up in 2 months.  I spent more than 25 minutes of face to face time with the patient. Greater than 50% of time was spent in counseling and coordination of care, reviewing test results and medication, and discussing work up for speech difficulty spells.    Orders Placed This Encounter  Procedures  . MR Brain Wo Contrast    Standing Status: Future     Number of Occurrences:      Standing Expiration Date: 05/01/2016    Order Specific Question:  Reason for Exam (SYMPTOM  OR DIAGNOSIS REQUIRED)    Answer:  TIA    Order Specific Question:  Preferred imaging location?    Answer:  Internal    Order Specific Question:  Does the patient have a pacemaker or implanted devices?    Answer:  No    Order Specific Question:  What is the patient's sedation requirement?    Answer:  No Sedation  . MR MRA HEAD WO CONTRAST    Standing Status: Future     Number of Occurrences:      Standing Expiration Date: 05/01/2016    Order Specific Question:  Reason for Exam (SYMPTOM  OR DIAGNOSIS REQUIRED)    Answer:  TIA    Order Specific Question:  Preferred imaging location?    Answer:  Internal    Order Specific Question:  Does the patient have  a pacemaker or implanted devices?    Answer:  No    Order Specific Question:  What is the patient's sedation requirement?    Answer:  No Sedation  . EEG    Standing Status: Future     Number of Occurrences:      Standing Expiration Date: 02/28/2016   No orders of the defined types were placed in this encounter.    Patient Instructions  - continue ASA and lipitor for stroke prevention - continue lipitor and fenofibrate for HLD - continue diamox '250mg'$  bid - continue follow up with ophthalmologist  - will check MRI and MRA, carotid doppler and EEG for evaluate of speech difficulty spells. - quit smoking - Follow up with your primary care physician for stroke risk factor modification. Recommend maintain blood pressure goal <130/80, diabetes with hemoglobin A1c goal below 6.5% and lipids with LDL cholesterol goal below 70 mg/dL.  - follow up in 2 months.   Rosalin Hawking, MD PhD [facility] [address], [place], [place] [address] [phone]

## 2015-03-01 ENCOUNTER — Telehealth: Payer: Self-pay | Admitting: General Practice

## 2015-03-01 NOTE — Telephone Encounter (Signed)
Labs reported to patient. 

## 2015-03-03 ENCOUNTER — Encounter: Payer: Self-pay | Admitting: Internal Medicine

## 2015-03-03 DIAGNOSIS — F4323 Adjustment disorder with mixed anxiety and depressed mood: Secondary | ICD-10-CM | POA: Insufficient documentation

## 2015-03-03 DIAGNOSIS — Z0000 Encounter for general adult medical examination without abnormal findings: Secondary | ICD-10-CM | POA: Insufficient documentation

## 2015-03-03 NOTE — Assessment & Plan Note (Signed)
Reminded her of the risks and harms from cigarettes on her especially given her past stroke.

## 2015-03-03 NOTE — Assessment & Plan Note (Signed)
She is on effexor from another provider which is not helping. She is no longer seeing them. Will gradually wean and start lexapro for better control.

## 2015-03-03 NOTE — Assessment & Plan Note (Signed)
BP at goal on her acetazolamide. Checking labs and adjust as needed.

## 2015-03-03 NOTE — Assessment & Plan Note (Signed)
Given tdap at visit and declines flu shot. Colonoscopy and mammogram up to date. PAP not indicated. Counseled about the need for smoking cessation given her past stroke. Checking labs and adjust as needed. Several medication changes today.

## 2015-03-14 ENCOUNTER — Other Ambulatory Visit (INDEPENDENT_AMBULATORY_CARE_PROVIDER_SITE_OTHER): Payer: BLUE CROSS/BLUE SHIELD

## 2015-03-14 ENCOUNTER — Encounter: Payer: Self-pay | Admitting: Family Medicine

## 2015-03-14 ENCOUNTER — Ambulatory Visit (INDEPENDENT_AMBULATORY_CARE_PROVIDER_SITE_OTHER): Payer: BLUE CROSS/BLUE SHIELD | Admitting: Family Medicine

## 2015-03-14 VITALS — BP 108/74 | HR 81 | Ht 58.5 in | Wt 141.0 lb

## 2015-03-14 DIAGNOSIS — M23206 Derangement of unspecified meniscus due to old tear or injury, right knee: Secondary | ICD-10-CM

## 2015-03-14 DIAGNOSIS — M23203 Derangement of unspecified medial meniscus due to old tear or injury, right knee: Secondary | ICD-10-CM

## 2015-03-14 DIAGNOSIS — M23209 Derangement of unspecified meniscus due to old tear or injury, unspecified knee: Secondary | ICD-10-CM | POA: Insufficient documentation

## 2015-03-14 DIAGNOSIS — M23200 Derangement of unspecified lateral meniscus due to old tear or injury, right knee: Secondary | ICD-10-CM

## 2015-03-14 DIAGNOSIS — M25561 Pain in right knee: Secondary | ICD-10-CM

## 2015-03-14 NOTE — Progress Notes (Signed)
[facility] [address] [place], [place] [address] Phone: [phone] Subjective:    I'm seeing this patient by the request  of:  Myrlene Broker, MD  CC: Right knee pain   [id number] Stephanie Booker is a 55 y.o. female coming in with complaint of right knee pain. Describes pain as a dull, throbbing aching sensation. Patient states it can be a sharp pain that sometimes makes her feel like her knee is unstable. Has been going on for weeks. States that she had a very similar presentation many years ago that did respond to an injection. Patient denies any weakness but once again states that sometimes it feels like give out on her. Has some mild residual numbness she states in this leg since her stroke greater than 2 years ago. Patient has been able to do daily activities but if she makes certain movements she can of a 9 out of 10 pain. Sometimes soreness at the end of the day as well. Denies any swelling of the lower extremity. Denies any redness of the leg. No pain on the posterior aspect only the medial aspect she states.     Past Medical History  Diagnosis Date  . Hyperlipidemia   . Anxiety   . OSA (obstructive sleep apnea)   . Stroke [facility])     ocular stroke   Past Surgical History  Procedure Laterality Date  . Abdominal hysterectomy     Social History   Social History  . Marital Status: Married    Spouse Name: N/A  . Number of Children: 0  . Years of Education: 12th   Occupational History  . cna    Social History Main Topics  . Smoking status: Current Every Day Smoker -- 1.00 packs/day  . Smokeless tobacco: Never Used  . Alcohol Use: 0.0 oz/week    0 Standard drinks or equivalent per week     Comment: occassionally  . Drug Use: No  . Sexual Activity: Yes   Other Topics Concern  . None   Social History Narrative   Patient lives at home with her husband    Patient is right handed   Patient coffee daily   No Known  Allergies Family History  Problem Relation Age of Onset  . Breast cancer Mother   . Dementia Mother     Past medical history, social, surgical and family history all reviewed in electronic medical record.  No pertanent information unless stated regarding to the chief complaint.   Review of Systems: No headache, visual changes, nausea, vomiting, diarrhea, constipation, dizziness, abdominal pain, skin rash, fevers, chills, night sweats, weight loss, swollen lymph nodes, body aches, joint swelling, muscle aches, chest pain, shortness of breath, mood changes.   Objective Blood pressure 108/74, pulse 81, height 4' 10.5" (1.486 m), weight 141 lb (63.957 kg), SpO2 98 %.  General: No apparent distress alert and oriented x3 mood and affect normal, dressed appropriately.  HEENT: Pupils equal, extraocular movements intact  Respiratory: Patient's speak in full sentences and does not appear short of breath  Cardiovascular: No lower extremity edema, non tender, no erythema  Skin: Warm dry intact with no signs of infection or rash on extremities or on axial skeleton.  Abdomen: Soft nontender  Neuro: Cranial nerves II through XII are intact, neurovascularly intact in all extremities with 2+ DTRs and 2+ pulses.  Lymph: No lymphadenopathy of posterior or anterior cervical chain or axillae bilaterally.  Gait normal with good balance and  coordination.  MSK:  Non tender with full range of motion and good stability and symmetric strength and tone of shoulders, elbows, wrist, hip, knee and ankles bilaterally.  Knee: Right  Normal to inspection with no erythema or effusion or obvious bony abnormalities. Tender over the medial joint line ROM full in flexion and extension and lower leg rotation. Ligaments with solid consistent endpoints including ACL, PCL, LCL, MCL. Positive Mcmurray's, Apley's, and Thessalonian tests. Non painful patellar compression. Patellar glide without crepitus. Patellar and quadriceps  tendons unremarkable. Hamstring and quadriceps strength is normal.  No pain with the contralateral side  MSK US performed of: Right knee This study was ordered, performed, and interpreted by Terrilee Files D.O.  Knee: All structures visualized. Posterior medial meniscus does have an acute on chronic tear with increasing Doppler flow. No significant displacement. Patellar Tendon unremarkable on long and transverse views without effusion. No abnormality of prepatellar bursa. LCL and MCL unremarkable on long and transverse views. No abnormality of origin of medial or lateral head of the gastrocnemius.  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  Verbal informed consent obtained.  Time-out conducted.  Noted no overlying erythema, induration, or other signs of local infection.  Skin prepped in a sterile fashion.  Local anesthesia: Topical Ethyl chloride.  With sterile technique and under real time ultrasound guidance: With a 22-gauge 2 inch needle patient was injected with 4 cc of 0.5% Marcaine and 1 cc of Kenalog 40 mg/dL. This was from a superior lateral approach.  Completed without difficulty  Pain immediately resolved suggesting accurate placement of the medication.  Advised to call if fevers/chills, erythema, induration, drainage, or persistent bleeding.  Images permanently stored and available for review in the ultrasound unit.  Impression: Technically successful ultrasound guided injection.   Procedure note 97110; 15 minutes spent for Therapeutic exercises as stated in above notes.  This included exercises focusing on stretching, strengthening, with significant focus  on eccentric aspects.  Flexion and extension exercises working on hip abductor strengthening as well as vastus medialis oblique and hamstring stretching. Proper technique shown and discussed handout in great detail with ATC.  All questions were discussed and answered.     Impression and Recommendations:     This case required medical decision making of moderate complexity.      Note: This dictation was prepared with Dragon dictation along with smaller phrase technology. Any transcriptional errors that result from this process are unintentional.

## 2015-03-14 NOTE — Assessment & Plan Note (Signed)
Patient was given an injection. Tolerated the procedure well. We discussed icing , home exercises and patient work with Event organiser today. Patient has a prescription for topical anti-inflammatories and will continue to use this. We discussed which activities to avoid. Patient will come back and see me again in 4-6 weeks for further evaluation and treatment.

## 2015-03-14 NOTE — Progress Notes (Signed)
Pre visit review using our clinic review tool, if applicable. No additional management support is needed unless otherwise documented below in the visit note. 

## 2015-03-14 NOTE — Patient Instructions (Signed)
Good to see you  Ice 20 minutes 2 times daily. Usually after activity and before bed. Exercises 3 times a week.  Continue the voltaren gel Vitamin D 2000 IU daily can help See me again in 4-6 weeks to make sure you are all the way better.

## 2015-03-16 ENCOUNTER — Ambulatory Visit (INDEPENDENT_AMBULATORY_CARE_PROVIDER_SITE_OTHER): Payer: BLUE CROSS/BLUE SHIELD

## 2015-03-16 DIAGNOSIS — H47012 Ischemic optic neuropathy, left eye: Secondary | ICD-10-CM | POA: Diagnosis not present

## 2015-03-18 ENCOUNTER — Ambulatory Visit (INDEPENDENT_AMBULATORY_CARE_PROVIDER_SITE_OTHER): Payer: BLUE CROSS/BLUE SHIELD | Admitting: Neurology

## 2015-03-18 DIAGNOSIS — R4701 Aphasia: Secondary | ICD-10-CM | POA: Diagnosis not present

## 2015-03-18 DIAGNOSIS — H47012 Ischemic optic neuropathy, left eye: Secondary | ICD-10-CM

## 2015-03-18 NOTE — Procedures (Signed)
    History:  Stephanie Booker is a 55 year old patient with episodes of left visual loss, episodes of speech hesitation and slurred speech lasting 5-10 seconds. The patient is being evaluated for these events.  This is a routine EEG. No skull defects are noted. Medications include Diamox, aspirin, Lipitor, diclofenac, fenofibrate, gabapentin, potassium supplementation, Xyzal, phentermine, Effexor, and vitamin B12 supplementation.   EEG classification: Normal awake and asleep  Description of the recording: The background rhythms of this recording consists of a fairly well modulated medium amplitude background activity of 9 Hz. As the record progresses, the patient initially is in the waking state, but appears to enter the early stage II sleep during the recording, with rudimentary sleep spindles and vertex sharp wave activity seen. During the wakeful state, photic stimulation is performed, and this results in a bilateral and symmetric photic driving response. Hyperventilation was also performed, and this results in a minimal buildup of the background rhythm activities without significant slowing seen. At no time during the recording does there appear to be evidence of spike or spike wave discharges or evidence of focal slowing. EKG monitor shows no evidence of cardiac rhythm abnormalities with a heart rate of 90.  Impression: This is a normal EEG recording in the waking and sleeping state. No evidence of ictal or interictal discharges were seen at any time during the recording.

## 2015-03-23 ENCOUNTER — Telehealth: Payer: Self-pay | Admitting: Neurology

## 2015-03-23 NOTE — Telephone Encounter (Signed)
Patient is returning a call. °

## 2015-03-23 NOTE — Telephone Encounter (Signed)
Rn call patient back about MRI head and brain, and EEG results. Rn explain both results were negative. No change in treatment plan. Pt verbalized understanding. Rn reminded patient of her vascular ultrasound in March at [facility],. Pt was given contact number for vascular department, and where to check in at admissions at [facility].

## 2015-04-03 ENCOUNTER — Other Ambulatory Visit: Payer: Self-pay | Admitting: Internal Medicine

## 2015-04-04 ENCOUNTER — Other Ambulatory Visit: Payer: Self-pay | Admitting: Internal Medicine

## 2015-04-08 ENCOUNTER — Ambulatory Visit (HOSPITAL_COMMUNITY)
Admission: RE | Admit: 2015-04-08 | Discharge: 2015-04-08 | Disposition: A | Discharge status: Home / Self Care | Payer: BLUE CROSS/BLUE SHIELD | Source: Ambulatory Visit | Attending: Neurology | Admitting: Neurology

## 2015-04-08 DIAGNOSIS — H47012 Ischemic optic neuropathy, left eye: Secondary | ICD-10-CM | POA: Diagnosis not present

## 2015-04-08 NOTE — Progress Notes (Signed)
Preliminary results by tech - Carotid Duplex Completed. No evidence of significant stenosis in bilateral carotid arteries. Vertebral flow demonstrate antegrade flow.  Stephanie Booker, BS, RDMS, RVT

## 2015-04-27 ENCOUNTER — Encounter: Payer: Self-pay | Admitting: Family Medicine

## 2015-04-27 ENCOUNTER — Ambulatory Visit (INDEPENDENT_AMBULATORY_CARE_PROVIDER_SITE_OTHER): Payer: BLUE CROSS/BLUE SHIELD | Admitting: Neurology

## 2015-04-27 ENCOUNTER — Ambulatory Visit (INDEPENDENT_AMBULATORY_CARE_PROVIDER_SITE_OTHER): Payer: BLUE CROSS/BLUE SHIELD | Admitting: Family Medicine

## 2015-04-27 ENCOUNTER — Encounter: Payer: Self-pay | Admitting: Neurology

## 2015-04-27 VITALS — BP 116/76 | HR 68 | Wt 140.0 lb

## 2015-04-27 VITALS — BP 113/74 | HR 88 | Ht 58.5 in | Wt 141.2 lb

## 2015-04-27 DIAGNOSIS — G932 Benign intracranial hypertension: Secondary | ICD-10-CM | POA: Diagnosis not present

## 2015-04-27 DIAGNOSIS — H47012 Ischemic optic neuropathy, left eye: Secondary | ICD-10-CM | POA: Diagnosis not present

## 2015-04-27 DIAGNOSIS — M23206 Derangement of unspecified meniscus due to old tear or injury, right knee: Secondary | ICD-10-CM

## 2015-04-27 DIAGNOSIS — M23200 Derangement of unspecified lateral meniscus due to old tear or injury, right knee: Secondary | ICD-10-CM

## 2015-04-27 DIAGNOSIS — F17200 Nicotine dependence, unspecified, uncomplicated: Secondary | ICD-10-CM | POA: Diagnosis not present

## 2015-04-27 DIAGNOSIS — E785 Hyperlipidemia, unspecified: Secondary | ICD-10-CM

## 2015-04-27 DIAGNOSIS — M23203 Derangement of unspecified medial meniscus due to old tear or injury, right knee: Secondary | ICD-10-CM

## 2015-04-27 NOTE — Progress Notes (Signed)
 STROKE NEUROLOGY FOLLOW UP NOTE  NAME: Stephanie Booker DOB: 07/24/1960  REASON FOR VISIT: stroke follow up HISTORY FROM: pt and chart  Today we had the pleasure of seeing Stephanie Booker in follow-up at our Neurology Clinic. Pt was accompanied by husband.   History Summary Stephanie Booker is an 55 y.o. female with PMH of HTN, HLD, current smoker, estrogen use who was admitted on 6/22 for left side vision loss. She stated that on 6/21 at night she began to have chest pain, denies any SOB, HA, vision change, weakness or numbness. She went to bed and on awakening the second day she had difficulty with vision in the left eye. She stated that she can see from left eye the lower part and outer part of the visual field but not middle and upper parts. She went to see her PCP and was directed to present to the ED for further evaluation. Her right eye vision and visual field were intact. She had MRI brian which was negative for stroke although limited study and with artifact. Her CUS and 2D echo unremarkable. However, her TG and cholesterol were high. ESR was 20. She was discharged home with ASA and lipitor and fenofibrate.   08/19/13 follow up - the patient has been doing well. She stated that her visual field on the left eye improved some, she is able to see more on the upper visual field but still has central part of vision loss on the left eye. She went to see ophthalmologist last week and was told that her left eye swollen fundus, consistent with stroke.  She has cut down her smoking ana on chantix. She has stopped estrogen since the hospitalization.   10/15/13 follow up - she had follow up with ophthalmologist Dr. 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 LP done clear but opening pressure 28. She was given diamox 250mg bid. After LP, she had post LP HA and was treated with hydration and bedrest. The HA lasted about 2  days and subsided. She did not feel much different after taking diamox. Her left eye central vision deficit may be slightly better but denies HA or N/V. She followed up with Dr. Almony again two weeks ago and was told fundi look a little better. She will see her again next week. Denies any side effect from diamox so far. She is still smoking and not quit yet. She said chantix not helping too much.   02/03/14 follow up - pt was doing the same. Left eye visual deficit remained the same. Denies headache. She followed up with ophthalmologist and was told left eye papilledema getting better. She complains of L>R hand tingling and throbbing pain at first 4 fingertips. Constant day and night, sometimes not able to open the jar affecting daily activities. 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 She has to take some advil sometimes for pain control.  11/09/14 follow up - pt was doing well. Had left CTS surgery in spring and much improved. Right CTS with only minimal symptoms. No intervention needed. Following with ophthal and no more improvement. Continue smoking, not quit yet.  02/28/15 follow up - pt has been doing well. However, husband complained that for the last 1-2 months, she occasionally has speech hesitation and slurry words for about 5-10 sec and then back to normal. Denies LOC but not sure if   there was any blank staring and pt not remember what happened for the 5-10 sec. Went to see PCP today and checked lipid panel showed improved TG from 242 down to 171, but LDL from 80 up to 100. Her vision has no changes, still has left central vision loss. Denies HA.  Pt also has right knee pain and difficulty with walking, PCP referred to orthopedics later this month.  Interval History During the interval time, pt was doing well. No recurrent episodes. Denies any HA, visual changes or speech difficulty. Had extensive work up with MRI and MRA, EEG and CUS, all negative. She came in today without complain.  Her BP 113/79 and recent lipid profile improved from before.  REVIEW OF SYSTEMS: Full 14 system review of systems performed and notable only for those listed below and in HPI above, all others are negative:  Constitutional: N/A  Cardiovascular: N/A  Ear/Nose/Throat: N/A  Skin: N/A  Eyes: Respiratory: cough Gastroitestinal: N/A  Genitourinary: N/A Hematology/Lymphatic: N/A  Endocrine: N/A  Musculoskeletal:  Allergy/Immunology: N/A  Neurological:  Psychiatric: N/A  The following represents the patient's updated allergies and side effects list: No Known Allergies  Labs since last visit of relevance include the following: Results for orders placed or performed in visit on 02/28/15  Lipid panel  Result Value Ref Range   Cholesterol 174 0 - 200 mg/dL   Triglycerides 171.0 (H) 0.0 - 149.0 mg/dL   HDL 39.30 >39.00 mg/dL   VLDL 34.2 0.0 - 40.0 mg/dL   LDL Cholesterol 100 (H) 0 - 99 mg/dL   Total CHOL/HDL Ratio 4    NonHDL 134.67   Comp Met (CMET)  Result Value Ref Range   Sodium 141 135 - 145 mEq/L   Potassium 3.6 3.5 - 5.1 mEq/L   Chloride 110 96 - 112 mEq/L   CO2 23 19 - 32 mEq/L   Glucose, Bld 69 (L) 70 - 99 mg/dL   BUN 21 6 - 23 mg/dL   Creatinine, Ser 0.83 0.40 - 1.20 mg/dL   Total Bilirubin 0.3 0.2 - 1.2 mg/dL   Alkaline Phosphatase 70 39 - 117 U/L   AST 13 0 - 37 U/L   ALT 12 0 - 35 U/L   Total Protein 7.2 6.0 - 8.3 g/dL   Albumin 4.2 3.5 - 5.2 g/dL   Calcium 9.2 8.4 - 10.5 mg/dL   GFR 76.05 >60.00 mL/min  HgB A1c  Result Value Ref Range   Hgb A1c MFr Bld 5.3 4.6 - 6.5 %  CBC  Result Value Ref Range   WBC 8.6 4.0 - 10.5 K/uL   RBC 4.50 3.87 - 5.11 Mil/uL   Platelets 285.0 150.0 - 400.0 K/uL   Hemoglobin 13.6 12.0 - 15.0 g/dL   HCT 40.1 36.0 - 46.0 %   MCV 89.0 78.0 - 100.0 fl   MCHC 34.0 30.0 - 36.0 g/dL   RDW 13.0 11.5 - 15.5 %    The neurologically relevant items on the patient's problem list were reviewed on today's visit.  Neurologic  Examination  A problem focused neurological exam (12 or more points of the single system neurologic examination, vital signs counts as 1 point, cranial nerves count for 8 points) was performed.  Blood pressure 113/74, pulse 88, height 4' 10.5" (1.486 m), weight 141 lb 3.2 oz (64.048 kg).  General - Well nourished, well developed, in no apparent distress.  Cardiovascular - Regular rate and rhythm with no murmur.  Mental Status -  Level of   arousal and orientation to time, place, and person were intact. Language including expression, naming, repetition, comprehension, reading, and writing was assessed and found intact. Attention span and concentration were normal. Recent and remote memory were intact. Fund of Knowledge was assessed and was intact.  Cranial Nerves II - XII - II - Vision intact OU. Left eye central visual field defect not change from last visit. Right eye visual field full. III, IV, VI - Extraocular movements intact. V - Facial sensation intact bilaterally. VII - Facial movement intact bilaterally. VIII - Hearing & vestibular intact bilaterally. X - Palate elevates symmetrically. XI - Chin turning & shoulder shrug intact bilaterally. XII - Tongue protrusion intact.  Motor Strength - The patient's strength was normal in all extremities and pronator drift was absent.  Bulk was normal and fasciculations were absent.   Motor Tone - Muscle tone was assessed at the neck and appendages and was normal.  Reflexes - The patient's reflexes were normal in all extremities and she had no pathological reflexes.  Sensory - Light touch, temperature/pinprick, vibration and proprioception, and Romberg testing were assessed and were normal.    Coordination - The patient had normal movements in the hands and feet with no ataxia or dysmetria.  Tremor was absent.  Gait and Station - normal gait.   Data reviewed: I personally reviewed the images and agree with the radiology  interpretations.  Mr Brain Wo Contrast  07/14/2013  Incomplete examination as above. No definite acute infarct identified. Abnormal diffusion weighted signal in the left occipital lobe is not confirmed on axial images and may be artifactual.  MRI and MRV brain  09/17/13 Exam quality limited by motion  Negative MRI of the brain  Negative MR venogram of the head.  CT of the brain  1. Normal head CT with no acute intracranial abnormality identified.  2. Right sphenoid sinus disease.  2D Echocardiogram  - Left ventricle: The cavity size was normal. Systolic function was normal. The estimated ejection fraction was in the range of 55% to 60%. Wall motion was normal; there were no regional wall motion abnormalities. - Atrial septum: No defect or patent foramen ovale was identified. Carotid Doppler  Prelim: <39% stenosis bilat  CXR  No active cardiopulmonary disease  EKG .  Sinus rhythm  Borderline T abnormalities, anterior leads  Baseline wander in lead(s) V3 V4 V5 V6  Component     Latest Ref Rng 07/14/2013  Cholesterol     0 - 200 mg/dL 212 (H)  Triglycerides     <150 mg/dL 531 (H)  HDL     >39 mg/dL 29 (L)  Total CHOL/HDL Ratio      7.3  VLDL     0 - 40 mg/dL UNABLE TO CALCULATE IF TRIGLYCERIDE OVER 400 mg/dL  LDL (calc)     0 - 99 mg/dL UNABLE TO CALCULATE IF TRIGLYCERIDE OVER 400 mg/dL  Hemoglobin A1C     <5.7 % 5.3  Mean Plasma Glucose     <117 mg/dL 105  Sed Rate     0 - 22 mm/hr 20   LP 09/17/13 On the first attempt, clear cerebral spinal fluid was obtained. Opening pressure was 28. 16 mL of CSF was therefore collected for therapeutic treatment of elevated intracranial pressure. Closing pressure was 9.  Component     Latest Ref Rng 09/17/2013         7:49 PM  Tube #      4  Color, CSF       COLORLESS COLORLESS  Appearance, CSF     CLEAR CLEAR  Supernatant      NOT INDICATED  RBC Count, CSF     0 /cu mm 13 (H)  WBC, CSF     0 - 5 /cu mm 1  Segmented  Neutrophils-CSF     0 - 6 %   Lymphs, CSF     40 - 80 % RARE  Monocyte-Macrophage-Spinal Fluid     15 - 45 % RARE  Other Cells, CSF      TOO FEW TO COUNT, SMEAR AVAILABLE FOR REVIEW  Specimen Description      CSF  Special Requests      NONE  Gram Stain      CYTOSPUN . . .  Culture        Report Status      09/17/2013 FINAL  Glucose, CSF     43 - 76 mg/dL 78 (H)  Total  Protein, CSF     15 - 45 mg/dL 32   Component     Latest Ref Rng 09/17/2013         7:49 PM  Tube #      1  Color, CSF     COLORLESS COLORLESS  Appearance, CSF     CLEAR CLEAR  Supernatant      NOT INDICATED  RBC Count, CSF     0 /cu mm 54 (H)  WBC, CSF     0 - 5 /cu mm 1  Segmented Neutrophils-CSF     0 - 6 % RARE  Lymphs, CSF     40 - 80 % RARE  Monocyte-Macrophage-Spinal Fluid     15 - 45 % RARE  Other Cells, CSF      TOO FEW TO COUNT, SMEAR AVAILABLE FOR REVIEW  Specimen Description      CSF  Special Requests      NONE  Gram Stain      WBC PRESENT, PREDOMINANTLY PMN . . .  Culture      NO GROWTH 3 DAYS . . .  Report Status      09/20/2013 FINAL  Glucose, CSF     43 - 76 mg/dL   Total  Protein, CSF     15 - 45 mg/dL    Component     Latest Ref Rng 02/28/2015  Cholesterol     0 - 200 mg/dL 174  Triglycerides     0.0 - 149.0 mg/dL 171.0 (H)  HDL Cholesterol     >39.00 mg/dL 39.30  VLDL     0.0 - 40.0 mg/dL 34.2  LDL (calc)     0 - 99 mg/dL 100 (H)  Total CHOL/HDL Ratio      4  NonHDL      134.67  Hemoglobin A1C     4.6 - 6.5 % 5.3    Assessment: Stephanie Booker is a 67 y.o. Caucasian female with PMH of HTN, HLD, smoker, estrogen use presented to hospital in 06/2013 for left side partial vision loss at the central vision. Other visual field intact. MRI negative for stroke in 06/2013. She has multiple risk factors for stroke and vasculopathy. 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 Follow up with ophth and felt bilateral papilledema, repeat MRI and add  MRV which were negative. LP showed slightly high OP 28 and started on diamox low dose for possible IIH treatment. Followed up with ophthal, was told normal fundi on the last visit.  She continues with ASA and lipitor for stroke prevention. 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 Not able to quit smoking.  During interval time, pt had occasional slurry speech, speech hesitancy, not sure if there is any blank staring, short lasting. Had work up with MRI and MRA, EEG and CUS, all negative. Episodes could be due to fatigue, tiredness, and mild encephalopathy. Since then, she has been doing well, no recurrent episodes. Denies HA.  Plan:  - continue ASA and lipitor for stroke prevention - continue lipitor and fenofibrate for HLD - continue diamox 271m bid - continue follow up with ophthalmologist  - quit smoking - Follow up with your primary care physician for stroke risk factor modification. Recommend maintain blood pressure goal <130/80, diabetes with hemoglobin A1c goal below 6.5% and lipids with LDL cholesterol goal below 70 mg/dL.  - healthy diet and regular exercise - avoid over exertion - follow up in one year.   No orders of the defined types were placed in this encounter.   No orders of the defined types were placed in this encounter.    Patient Instructions  - continue ASA and lipitor for stroke prevention - continue lipitor and fenofibrate for HLD - continue diamox 2558mbid - continue follow up with ophthalmologist  - quit smoking - Follow up with your primary care physician for stroke risk factor modification. Recommend maintain blood pressure goal <130/80, diabetes with hemoglobin A1c goal below 6.5% and lipids with LDL cholesterol goal below 70 mg/dL.  - healthy diet and regular exercise - avoid over exertion - follow up in one year.   JiRosalin HawkingMD PhD [facility] [address]SuRosamondrSuffield [place] [phone]

## 2015-04-27 NOTE — Assessment & Plan Note (Signed)
Doing significantly well and this time. Discussed icing stone. Discussed trying to do the exercises 2 times a week for another 6 weeks and still avoiding any squatting and twisting motions at the same time. Patient does well she can follow-up as needed.

## 2015-04-27 NOTE — Patient Instructions (Signed)
Good to see you  You are doing great  Ice is great when you need it Avoid twisting and squatting at the same time for another month Otherwise see me when you need me!

## 2015-04-27 NOTE — Progress Notes (Signed)
Stephanie Booker [address] [place], [place] [address] Phone: [phone] Subjective:    I'm seeing this patient by the request  of:  Stephanie Broker, MD  CC: Right knee pain Follow-up  Stephanie Booker is a 55 y.o. female coming in with complaint of right knee pain. Patient was found to have an acute on chronic tear previously. Was given an injection. Has been doing conservative therapy for the last 6 weeks including home exercises and icing pedicle. An states that she is 100% better. No locking or giving out on her. No pain at all. Able to do all daily activities and is resting comfortably..     Past Medical History  Diagnosis Date  . Hyperlipidemia   . Anxiety   . OSA (obstructive sleep apnea)   . Stroke [facility])     ocular stroke   Past Surgical History  Procedure Laterality Date  . Abdominal hysterectomy     Social History   Social History  . Marital Status: Married    Spouse Name: N/A  . Number of Children: 0  . Years of Education: 12th   Occupational History  . cna    Social History Main Topics  . Smoking status: Current Every Day Smoker -- 1.00 packs/day  . Smokeless tobacco: Never Used  . Alcohol Use: 0.0 oz/week    0 Standard drinks or equivalent per week     Comment: occassionally  . Drug Use: No  . Sexual Activity: Yes   Other Topics Concern  . None   Social History Narrative   Patient lives at home with her husband    Patient is right handed   Patient coffee daily   No Known Allergies Family History  Problem Relation Age of Onset  . Breast cancer Mother   . Dementia Mother     Past medical history, social, surgical and family history all reviewed in electronic medical record.  No pertanent information unless stated regarding to the chief complaint.   Review of Systems: No headache, visual changes, nausea, vomiting, diarrhea, constipation, dizziness, abdominal pain, skin rash, fevers, chills, night  sweats, weight loss, swollen lymph nodes, body aches, joint swelling, muscle aches, chest pain, shortness of breath, mood changes.   Objective Blood pressure 116/76, pulse 68, weight 140 lb (63.504 kg).  General: No apparent distress alert and oriented x3 mood and affect normal, dressed appropriately.  HEENT: Pupils equal, extraocular movements intact  Respiratory: Patient's speak in full sentences and does not appear short of breath  Cardiovascular: No lower extremity edema, non tender, no erythema  Skin: Warm dry intact with no signs of infection or rash on extremities or on axial skeleton.  Abdomen: Soft nontender  Neuro: Cranial nerves II through XII are intact, neurovascularly intact in all extremities with 2+ DTRs and 2+ pulses.  Lymph: No lymphadenopathy of posterior or anterior cervical chain or axillae bilaterally.  Gait normal with good balance and coordination.  MSK:  Non tender with full range of motion and good stability and symmetric strength and tone of shoulders, elbows, wrist, hip, knee and ankles bilaterally.  Knee: Right  Normal to inspection with no erythema or effusion or obvious bony abnormalities. Nontender on exam today ROM full in flexion and extension and lower leg rotation. Ligaments with solid consistent endpoints including ACL, PCL, LCL, MCL. Negative Mcmurray's, Apley's, and Thessalonian tests. Non painful patellar compression. Patellar glide without crepitus. Patellar and quadriceps tendons unremarkable. Hamstring and quadriceps strength  is normal.  No pain with the contralateral side      Impression and Recommendations:     This case required medical decision making of moderate complexity.      Note: This dictation was prepared with Dragon dictation along with smaller phrase technology. Any transcriptional errors that result from this process are unintentional.

## 2015-04-27 NOTE — Patient Instructions (Addendum)
-   continue ASA and lipitor for stroke prevention - continue lipitor and fenofibrate for HLD - continue diamox 250mg  bid - continue follow up with ophthalmologist  - quit smoking - Follow up with your primary care physician for stroke risk factor modification. Recommend maintain blood pressure goal <130/80, diabetes with hemoglobin A1c goal below 6.5% and lipids with LDL cholesterol goal below 70 mg/dL.  - healthy diet and regular exercise - avoid over exertion - follow up in one year.

## 2015-04-28 ENCOUNTER — Ambulatory Visit: Payer: BLUE CROSS/BLUE SHIELD | Admitting: Neurology

## 2015-04-29 ENCOUNTER — Ambulatory Visit: Payer: BLUE CROSS/BLUE SHIELD | Admitting: Internal Medicine

## 2015-04-29 ENCOUNTER — Other Ambulatory Visit: Payer: Self-pay | Admitting: Internal Medicine

## 2015-05-03 ENCOUNTER — Ambulatory Visit: Payer: BLUE CROSS/BLUE SHIELD | Admitting: Internal Medicine

## 2015-05-10 ENCOUNTER — Ambulatory Visit: Payer: BLUE CROSS/BLUE SHIELD | Admitting: Family

## 2015-05-10 DIAGNOSIS — Z0289 Encounter for other administrative examinations: Secondary | ICD-10-CM

## 2015-05-11 ENCOUNTER — Telehealth: Payer: Self-pay | Admitting: Internal Medicine

## 2015-05-11 NOTE — Telephone Encounter (Signed)
error 

## 2015-05-25 ENCOUNTER — Other Ambulatory Visit: Payer: Self-pay | Admitting: Internal Medicine

## 2015-05-25 ENCOUNTER — Encounter: Payer: Self-pay | Admitting: Family

## 2015-05-25 ENCOUNTER — Ambulatory Visit (INDEPENDENT_AMBULATORY_CARE_PROVIDER_SITE_OTHER): Payer: BLUE CROSS/BLUE SHIELD | Admitting: Family

## 2015-05-25 VITALS — BP 122/84 | HR 87 | Temp 98.1°F | Resp 14 | Ht 58.5 in | Wt 140.0 lb

## 2015-05-25 DIAGNOSIS — E663 Overweight: Secondary | ICD-10-CM | POA: Diagnosis not present

## 2015-05-25 DIAGNOSIS — F4323 Adjustment disorder with mixed anxiety and depressed mood: Secondary | ICD-10-CM

## 2015-05-25 MED ORDER — ESCITALOPRAM OXALATE 20 MG PO TABS: 20.0000 mg | ORAL_TABLET | Freq: Every day | ORAL | Status: DC

## 2015-05-25 MED ORDER — PHENTERMINE HCL 37.5 MG PO TABS: 37.5000 mg | ORAL_TABLET | Freq: Every day | ORAL | Status: DC

## 2015-05-25 NOTE — Assessment & Plan Note (Addendum)
Previously prescribed phentermine and reports no adverse effects. Discussed the continued risk with the medication given her history of hypertension and stroke. She is aware of the risk and would like to continue. Refill phentermine. Encouraged continued lifestyle management through nutrition and physical activity. Blood pressure is adequately controlled.

## 2015-05-25 NOTE — Assessment & Plan Note (Signed)
Adjustment disorder and anxiety improved with Lexapro although patient indicates she continues to struggle with symptoms. Increase Lexapro. Denies suicidal ideations. Follow up in 1 month or sooner if needed.

## 2015-05-25 NOTE — Progress Notes (Signed)
Subjective:    Patient ID: Stephanie Booker, female    DOB: 05/17/1960, 55 y.o.   MRN: [id number]  Chief Complaint  Patient presents with  . Follow-up    following up on medications, would like refill of phentermine and wants to increase dose of lexapro    HPI:  Stephanie Booker is a 55 y.o. female who  has a past medical history of Hyperlipidemia; Anxiety; OSA (obstructive sleep apnea); and Stroke (HCC). and presents today For an office follow-up.  1.) Depression and anxiety - currently maintained on Lexapro. Reports taking the medication as prescribed and denies adverse side effects. Notes her symptoms are improved but there is still some room for further improvement. Denies thoughts of suicidal ideation. Does continue to have anxiety.   2.) Overweight - previously prescribed phentermine to assist with weight loss in combination with lifestyle management. There was significant concern during previous office visit given her previous history of stroke and high blood pressure for continued use of phentermine. She reports taking the medication as prescribed previously with no adverse side effects.  Wt Readings from Last 3 Encounters:  05/25/15 140 lb (63.504 kg)  04/27/15 141 lb 3.2 oz (64.048 kg)  04/27/15 140 lb (63.504 kg)   No Known Allergies   Current Outpatient Prescriptions on File Prior to Visit  Medication Sig Dispense Refill  . acetaZOLAMIDE (DIAMOX) 250 MG tablet TAKE 1 TABLET BY MOUTH TWICE A DAY 180 tablet 2  . aspirin EC 325 MG tablet Take 1 tablet (325 mg total) by mouth daily. 30 tablet 0  . atorvastatin (LIPITOR) 20 MG tablet TAKE 1 TABLET BY MOUTH EVERY DAY AT 6 PM 90 tablet 1  . Biotin (BIOTIN 5000) 5 MG CAPS Take by mouth.    . fenofibrate 160 MG tablet Take 1 tablet (160 mg total) by mouth daily. 90 tablet 2  . gabapentin (NEURONTIN) 300 MG capsule Take 300 mg by mouth 3 (three) times daily.    . Methylcellulose, Laxative, 500 MG TABS Take by mouth.     No  current facility-administered medications on file prior to visit.     Past Surgical History  Procedure Laterality Date  . Abdominal hysterectomy      Review of Systems  Constitutional: Negative for fever and chills.  Psychiatric/Behavioral: Negative for suicidal ideas, sleep disturbance, dysphoric mood and decreased concentration. The patient is nervous/anxious.       Objective:    BP 122/84 mmHg  Pulse 87  Temp(Src) 98.1 F (36.7 C) (Oral)  Resp 14  Ht 4' 10.5" (1.486 m)  Wt 140 lb (63.504 kg)  BMI 28.76 kg/m2  SpO2 95% Nursing note and vital signs reviewed.  Physical Exam  Constitutional: She is oriented to person, place, and time. She appears well-developed and well-nourished. No distress.  Cardiovascular: Normal rate, regular rhythm, normal heart sounds and intact distal pulses.   Pulmonary/Chest: Effort normal and breath sounds normal.  Neurological: She is alert and oriented to person, place, and time.  Skin: Skin is warm and dry.  Psychiatric: Her behavior is normal. Judgment and thought content normal. Her mood appears anxious.       Assessment & Plan:   Problem List Items Addressed This Visit      Other   Adjustment disorder with mixed anxiety and depressed mood - Primary    Adjustment disorder and anxiety improved with Lexapro although patient indicates she continues to struggle with symptoms. Increase Lexapro. Denies suicidal ideations. Follow up in  1 month or sooner if needed.       Relevant Medications   escitalopram (LEXAPRO) 20 MG tablet   Overweight (BMI 25.0-29.9)    Previously prescribed phentermine and reports no adverse effects. Discussed the continued risk with the medication given her history of hypertension and stroke. She is aware of the risk and would like to continue. Refill phentermine. Encouraged continued lifestyle management through nutrition and physical activity. Blood pressure is adequately controlled.       Relevant Medications    phentermine (ADIPEX-P) 37.5 MG tablet       I have discontinued Ms. Gloor vitamin B-12, venlafaxine XR, venlafaxine, estradiol, phentermine, and diclofenac sodium. I have also changed her escitalopram. Additionally, I am having her start on phentermine. Lastly, I am having her maintain her aspirin EC, fenofibrate, gabapentin, Biotin, Methylcellulose (Laxative), acetaZOLAMIDE, atorvastatin, and KLOR-CON M10.   Meds ordered this encounter  Medications  . phentermine (ADIPEX-P) 37.5 MG tablet    Sig: Take 1 tablet (37.5 mg total) by mouth daily before breakfast.    Dispense:  30 tablet    Refill:  0    Order Specific Question:  Supervising Provider    Answer:  Hillard Danker A [[id number]]  . escitalopram (LEXAPRO) 20 MG tablet    Sig: Take 1 tablet (20 mg total) by mouth daily.    Dispense:  30 tablet    Refill:  1    Order Specific Question:  Supervising Provider    Answer:  Hillard Danker A [[id number]]     Follow-up: Return in about 1 month (around 06/25/2015).  Jeanine Luz, FNP

## 2015-05-25 NOTE — Progress Notes (Signed)
Pre visit review using our clinic review tool, if applicable. No additional management support is needed unless otherwise documented below in the visit note. 

## 2015-05-25 NOTE — Patient Instructions (Signed)
Thank you for choosing [facility].  Summary/Instructions:  Please continue to take your medications as prescribed.   Your prescription(s) have been submitted to your pharmacy or been printed and provided for you. Please take as directed and contact our office if you believe you are having problem(s) with the medication(s) or have any questions.  If your symptoms worsen or fail to improve, please contact our office for further instruction, or in case of emergency go directly to the emergency room at the closest medical facility.     

## 2015-06-24 ENCOUNTER — Other Ambulatory Visit: Payer: Self-pay | Admitting: Internal Medicine

## 2015-06-29 ENCOUNTER — Ambulatory Visit: Payer: BLUE CROSS/BLUE SHIELD | Admitting: Internal Medicine

## 2015-07-07 ENCOUNTER — Ambulatory Visit (INDEPENDENT_AMBULATORY_CARE_PROVIDER_SITE_OTHER): Payer: BLUE CROSS/BLUE SHIELD | Admitting: Family

## 2015-07-07 ENCOUNTER — Encounter: Payer: Self-pay | Admitting: Family

## 2015-07-07 VITALS — BP 144/84 | HR 91 | Temp 98.2°F | Resp 16 | Ht 58.5 in | Wt 140.0 lb

## 2015-07-07 DIAGNOSIS — E663 Overweight: Secondary | ICD-10-CM

## 2015-07-07 DIAGNOSIS — F4323 Adjustment disorder with mixed anxiety and depressed mood: Secondary | ICD-10-CM

## 2015-07-07 DIAGNOSIS — Z720 Tobacco use: Secondary | ICD-10-CM | POA: Diagnosis not present

## 2015-07-07 MED ORDER — PHENTERMINE HCL 37.5 MG PO TABS: 37.5000 mg | ORAL_TABLET | Freq: Every day | ORAL | Status: DC

## 2015-07-07 MED ORDER — ESCITALOPRAM OXALATE 20 MG PO TABS: 20.0000 mg | ORAL_TABLET | Freq: Every day | ORAL | Status: DC

## 2015-07-07 MED ORDER — VALACYCLOVIR HCL 1 G PO TABS: 2000.0000 mg | ORAL_TABLET | Freq: Two times a day (BID) | ORAL | Status: DC

## 2015-07-07 MED ORDER — BUPROPION HCL ER (SR) 100 MG PO TB12: ORAL_TABLET | ORAL | Status: DC

## 2015-07-07 NOTE — Assessment & Plan Note (Signed)
Weight continues to remain stable and indicates that she has not taken the medication because she has not had it. Notes that the quality of her nutrition is lacking as well as physical activity. Emphasized importance of improving nutrition and increasing physical activity to goal of 30 minutes of moderate level activity daily. Refill phentermine. Follow-up for weight checks.

## 2015-07-07 NOTE — Patient Instructions (Signed)
Thank you for choosing [facility].  Summary/Instructions:  Please continue to take your medications as prescribed.   Continue to work on lifestyle changes.   Valacyclovir 2000 grams 2x for 1 day for cold sore.   Your prescription(s) have been submitted to your pharmacy or been printed and provided for you. Please take as directed and contact our office if you believe you are having problem(s) with the medication(s) or have any questions.  If your symptoms worsen or fail to improve, please contact our office for further instruction, or in case of emergency go directly to the emergency room at the closest medical facility.

## 2015-07-07 NOTE — Progress Notes (Signed)
Pre visit review using our clinic review tool, if applicable. No additional management support is needed unless otherwise documented below in the visit note. 

## 2015-07-07 NOTE — Assessment & Plan Note (Signed)
Tobacco use with failed attempts with Chantix. Would like to start Zyban. Discussed risks and proper use of medication and cleaning side effects. Start Zyban. Follow-up pending tobacco cessation.

## 2015-07-07 NOTE — Progress Notes (Signed)
Subjective:    Patient ID: Stephanie Booker, female    DOB: 07/27/1960, 55 y.o.   MRN: [id number]  Chief Complaint  Patient presents with  . Follow-up    wants to talk about getting on something to help quit smoking besides chantix, medications are working good she just feels very tired, wants refill of phentermine    HPI:  Stephanie Booker is a 55 y.o. female who  has a past medical history of Hyperlipidemia; Anxiety; OSA (obstructive sleep apnea); and Stroke (HCC). and presents today for a follow up office visit.   1.) Tobacco cessation - Current smoker with failed attempts of with Chantix. Would like to try Zyban.   2.) Weight gain - Currently maintained on phentermine. Reports that she did not receive the previous prescription and has not taken the medication in the past month. Indicates that she is not eating like she should. Occasional physical activity when she is not working.   Wt Readings from Last 3 Encounters:  07/07/15 140 lb (63.504 kg)  05/25/15 140 lb (63.504 kg)  04/27/15 141 lb 3.2 oz (64.048 kg)    3.) Anxiety and depression - Currently maintained on Lexapro and increased in the last month. Reports taking the medication as prescribed and denies adverse side effects. Notes that her mood has been improved. Denies suicidal ideations.    No Known Allergies   Current Outpatient Prescriptions on File Prior to Visit  Medication Sig Dispense Refill  . acetaZOLAMIDE (DIAMOX) 250 MG tablet TAKE 1 TABLET BY MOUTH TWICE A DAY 180 tablet 2  . aspirin EC 325 MG tablet Take 1 tablet (325 mg total) by mouth daily. 30 tablet 0  . atorvastatin (LIPITOR) 20 MG tablet TAKE 1 TABLET BY MOUTH EVERY DAY AT 6 PM 90 tablet 1  . Biotin (BIOTIN 5000) 5 MG CAPS Take by mouth.    . fenofibrate 160 MG tablet Take 1 tablet (160 mg total) by mouth daily. 90 tablet 2  . gabapentin (NEURONTIN) 300 MG capsule Take 300 mg by mouth 3 (three) times daily.    Marland Kitchen. KLOR-CON M10 10 MEQ tablet TAKE 1  TABLET (10 MEQ TOTAL) BY MOUTH DAILY. 90 tablet 2  . Methylcellulose, Laxative, 500 MG TABS Take by mouth.     No current facility-administered medications on file prior to visit.     Past Surgical History  Procedure Laterality Date  . Abdominal hysterectomy       Review of Systems  Constitutional: Negative for fever and chills.  Respiratory: Negative for chest tightness and shortness of breath.   Cardiovascular: Negative for chest pain, palpitations and leg swelling.  Psychiatric/Behavioral: Negative for dysphoric mood. The patient is not nervous/anxious.       Objective:    BP 144/84 mmHg  Pulse 91  Temp(Src) 98.2 F (36.8 C) (Oral)  Resp 16  Ht 4' 10.5" (1.486 m)  Wt 140 lb (63.504 kg)  BMI 28.76 kg/m2  SpO2 96% Nursing note and vital signs reviewed.  Physical Exam  Constitutional: She is oriented to person, place, and time. She appears well-developed and well-nourished. No distress.  Cardiovascular: Normal rate, regular rhythm, normal heart sounds and intact distal pulses.   Pulmonary/Chest: Effort normal and breath sounds normal.  Neurological: She is alert and oriented to person, place, and time.  Skin: Skin is warm and dry.  Psychiatric: She has a normal mood and affect. Her behavior is normal. Judgment and thought content normal.  Assessment & Plan:   Problem List Items Addressed This Visit      Other   Tobacco abuse    Tobacco use with failed attempts with Chantix. Would like to start Zyban. Discussed risks and proper use of medication and cleaning side effects. Start Zyban. Follow-up pending tobacco cessation.      Relevant Medications   buPROPion (WELLBUTRIN SR) 100 MG 12 hr tablet   Adjustment disorder with mixed anxiety and depressed mood - Primary    Anxiety and depression appear adequately controlled with increased dose of Lexapro. No adverse side effects or suicidal ideations. Continue current dosage of Lexapro. Follow-up with PCP for  additional management and changes if necessary.      Relevant Medications   escitalopram (LEXAPRO) 20 MG tablet   Overweight (BMI 25.0-29.9)    Weight continues to remain stable and indicates that she has not taken the medication because she has not had it. Notes that the quality of her nutrition is lacking as well as physical activity. Emphasized importance of improving nutrition and increasing physical activity to goal of 30 minutes of moderate level activity daily. Refill phentermine. Follow-up for weight checks.      Relevant Medications   phentermine (ADIPEX-P) 37.5 MG tablet       I am having Stephanie Booker start on buPROPion and valACYclovir. I am also having her maintain her aspirin EC, fenofibrate, gabapentin, Biotin, Methylcellulose (Laxative), acetaZOLAMIDE, atorvastatin, KLOR-CON M10, phentermine, and escitalopram.   Meds ordered this encounter  Medications  . phentermine (ADIPEX-P) 37.5 MG tablet    Sig: Take 1 tablet (37.5 mg total) by mouth daily before breakfast.    Dispense:  30 tablet    Refill:  0    Order Specific Question:  Supervising Provider    Answer:  Hillard Danker A [[id number]]  . escitalopram (LEXAPRO) 20 MG tablet    Sig: Take 1 tablet (20 mg total) by mouth daily.    Dispense:  30 tablet    Refill:  2    Order Specific Question:  Supervising Provider    Answer:  Hillard Danker A [[id number]]  . buPROPion (WELLBUTRIN SR) 100 MG 12 hr tablet    Sig: Take 1 tablet by mouth for 3 days and then increase to 1 tablet by mouth twice daily.    Dispense:  60 tablet    Refill:  2    Order Specific Question:  Supervising Provider    Answer:  Hillard Danker A [[id number]]  . valACYclovir (VALTREX) 1000 MG tablet    Sig: Take 2 tablets (2,000 mg total) by mouth 2 (two) times daily.    Dispense:  4 tablet    Refill:  2    Order Specific Question:  Supervising Provider    Answer:  Hillard Danker A [[id number]]     Follow-up: Return in about 1 month (around  08/06/2015).  Jeanine Luz, FNP

## 2015-07-07 NOTE — Assessment & Plan Note (Signed)
Anxiety and depression appear adequately controlled with increased dose of Lexapro. No adverse side effects or suicidal ideations. Continue current dosage of Lexapro. Follow-up with PCP for additional management and changes if necessary.

## 2015-07-24 ENCOUNTER — Other Ambulatory Visit: Payer: Self-pay | Admitting: Internal Medicine

## 2015-07-25 ENCOUNTER — Other Ambulatory Visit: Payer: Self-pay | Admitting: Internal Medicine

## 2015-07-27 ENCOUNTER — Telehealth: Payer: Self-pay | Admitting: *Deleted

## 2015-07-27 DIAGNOSIS — F4323 Adjustment disorder with mixed anxiety and depressed mood: Secondary | ICD-10-CM

## 2015-07-27 MED ORDER — ESCITALOPRAM OXALATE 20 MG PO TABS: 20.0000 mg | ORAL_TABLET | Freq: Every day | ORAL | Status: DC

## 2015-07-27 NOTE — Telephone Encounter (Signed)
Received call pt states she is needing refills on her fenofibrate, Lipitor, and lexapro today. Inform pt refills was sent on Monday for the fenofibrate & Lipitor. Per chart lexapro should have refills. She states [facility] told [facility] they didn't have. Inform pt will resend lexapro.Marland Kitchen.Raechel Chute/lmb

## 2015-09-01 ENCOUNTER — Other Ambulatory Visit: Payer: Self-pay | Admitting: Internal Medicine

## 2015-09-23 ENCOUNTER — Other Ambulatory Visit: Payer: Self-pay | Admitting: Family

## 2015-09-23 DIAGNOSIS — Z720 Tobacco use: Secondary | ICD-10-CM

## 2016-01-13 ENCOUNTER — Other Ambulatory Visit: Payer: Self-pay | Admitting: Internal Medicine

## 2016-01-13 DIAGNOSIS — Z720 Tobacco use: Secondary | ICD-10-CM

## 2016-02-12 ENCOUNTER — Other Ambulatory Visit: Payer: Self-pay | Admitting: Internal Medicine

## 2016-03-16 ENCOUNTER — Other Ambulatory Visit: Payer: Self-pay | Admitting: Internal Medicine

## 2016-04-03 ENCOUNTER — Encounter: Payer: BLUE CROSS/BLUE SHIELD | Admitting: Internal Medicine

## 2016-04-11 ENCOUNTER — Encounter: Payer: Self-pay | Admitting: Internal Medicine

## 2016-04-11 ENCOUNTER — Ambulatory Visit (INDEPENDENT_AMBULATORY_CARE_PROVIDER_SITE_OTHER): Payer: BLUE CROSS/BLUE SHIELD | Admitting: Internal Medicine

## 2016-04-11 ENCOUNTER — Other Ambulatory Visit (INDEPENDENT_AMBULATORY_CARE_PROVIDER_SITE_OTHER): Payer: BLUE CROSS/BLUE SHIELD

## 2016-04-11 VITALS — BP 118/76 | HR 83 | Temp 97.6°F | Ht 58.5 in | Wt 144.0 lb

## 2016-04-11 DIAGNOSIS — I10 Essential (primary) hypertension: Secondary | ICD-10-CM | POA: Diagnosis not present

## 2016-04-11 DIAGNOSIS — E663 Overweight: Secondary | ICD-10-CM

## 2016-04-11 DIAGNOSIS — Z1159 Encounter for screening for other viral diseases: Secondary | ICD-10-CM | POA: Diagnosis not present

## 2016-04-11 DIAGNOSIS — F17200 Nicotine dependence, unspecified, uncomplicated: Secondary | ICD-10-CM | POA: Diagnosis not present

## 2016-04-11 DIAGNOSIS — E785 Hyperlipidemia, unspecified: Secondary | ICD-10-CM

## 2016-04-11 DIAGNOSIS — Z0000 Encounter for general adult medical examination without abnormal findings: Secondary | ICD-10-CM

## 2016-04-11 LAB — LIPID PANEL
Cholesterol: 151 mg/dL (ref 0–200)
HDL: 37.1 mg/dL — ABNORMAL LOW (ref >39.00)
LDL Cholesterol: 80 mg/dL (ref 0–99)
NonHDL: 113.56
Total CHOL/HDL Ratio: 4
Triglycerides: 166 mg/dL — ABNORMAL HIGH (ref 0.0–149.0)
VLDL: 33.2 mg/dL (ref 0.0–40.0)

## 2016-04-11 LAB — COMPREHENSIVE METABOLIC PANEL
ALK PHOS: 53 U/L (ref 39–117)
ALT: 11 U/L (ref 0–35)
AST: 14 U/L (ref 0–37)
Albumin: 4.1 g/dL (ref 3.5–5.2)
BUN: 23 mg/dL (ref 6–23)
CHLORIDE: 115 meq/L — AB (ref 96–112)
CO2: 19 mEq/L (ref 19–32)
Calcium: 9.2 mg/dL (ref 8.4–10.5)
Creatinine, Ser: 0.83 mg/dL (ref 0.40–1.20)
GFR: 75.73 mL/min (ref >60.00)
GLUCOSE: 90 mg/dL (ref 70–99)
POTASSIUM: 3.9 meq/L (ref 3.5–5.1)
SODIUM: 139 meq/L (ref 135–145)
TOTAL PROTEIN: 6.9 g/dL (ref 6.0–8.3)
Total Bilirubin: 0.3 mg/dL (ref 0.2–1.2)

## 2016-04-11 LAB — VITAMIN B12: Vitamin B-12: >1500 pg/mL — ABNORMAL HIGH (ref 211–911)

## 2016-04-11 LAB — CBC
HEMATOCRIT: 40.3 % (ref 36.0–46.0)
Hemoglobin: 13.6 g/dL (ref 12.0–15.0)
MCHC: 33.8 g/dL (ref 30.0–36.0)
MCV: 90.4 fl (ref 78.0–100.0)
Platelets: 266 10*3/uL (ref 150.0–400.0)
RBC: 4.46 Mil/uL (ref 3.87–5.11)
RDW: 13.9 % (ref 11.5–15.5)
WBC: 7 10*3/uL (ref 4.0–10.5)

## 2016-04-11 LAB — VITAMIN D 25 HYDROXY (VIT D DEFICIENCY, FRACTURES): VITD: 18.45 ng/mL — ABNORMAL LOW (ref 30.00–100.00)

## 2016-04-11 LAB — HEMOGLOBIN A1C: HEMOGLOBIN A1C: 5.4 % (ref 4.6–6.5)

## 2016-04-11 LAB — TSH: TSH: 1.8 u[IU]/mL (ref 0.35–4.50)

## 2016-04-11 NOTE — Progress Notes (Signed)
   Subjective:    Patient ID: Stephanie Booker, female    DOB: 03/23/1960, 56 y.o.   MRN: [id number]  HPI The patient is a 56 YO female coming in for wellness. No new concerns, some mild constipation.   PMH, [facility], social history reviewed and updated.   Review of Systems  Constitutional: Negative.   HENT: Negative.   Eyes: Positive for visual disturbance.  Respiratory: Negative for cough, chest tightness and shortness of breath.   Cardiovascular: Negative for chest pain, palpitations and leg swelling.  Gastrointestinal: Positive for constipation. Negative for abdominal distention, abdominal pain, diarrhea, nausea and vomiting.  Musculoskeletal: Positive for arthralgias. Negative for back pain, gait problem, myalgias and neck pain.  Skin: Negative.   Neurological: Negative.   Psychiatric/Behavioral: Negative.       Objective:   Physical Exam  Constitutional: She is oriented to person, place, and time. She appears well-developed and well-nourished.  HENT:  Head: Normocephalic and atraumatic.  Right Ear: External ear normal.  Left Ear: External ear normal.  Eyes: EOM are normal. Pupils are equal, round, and reactive to light.  Neck: Normal range of motion.  Cardiovascular: Normal rate and regular rhythm.   Pulmonary/Chest: Effort normal and breath sounds normal. No respiratory distress. She has no wheezes. She has no rales.  Abdominal: Soft. Bowel sounds are normal. She exhibits no distension. There is no tenderness. There is no rebound.  Musculoskeletal: She exhibits no edema.  Neurological: She is alert and oriented to person, place, and time. Coordination normal.  Skin: Skin is warm and dry.  Psychiatric: She has a normal mood and affect.   Vitals:   04/11/16 1021  BP: 118/76  Pulse: 83  Temp: 97.6 F (36.4 C)  TempSrc: Oral  SpO2: 99%  Weight: 144 lb (65.3 kg)  Height: 4' 10.5" (1.486 m)      Assessment & Plan:

## 2016-04-11 NOTE — Assessment & Plan Note (Signed)
Checking lipid panel and adjust lipitor 20 mg daily for goal <100 (prior stroke).

## 2016-04-11 NOTE — Progress Notes (Signed)
Pre visit review using our clinic review tool, if applicable. No additional management support is needed unless otherwise documented below in the visit note. 

## 2016-04-11 NOTE — Patient Instructions (Signed)
We are checking the blood work today and will call you back with the results.    High-Fiber Diet Fiber, also called dietary fiber, is a type of carbohydrate found in fruits, vegetables, whole grains, and beans. A high-fiber diet can have many health benefits. Your health care provider may recommend a high-fiber diet to help:  Prevent constipation. Fiber can make your bowel movements more regular.  Lower your cholesterol.  Relieve hemorrhoids, uncomplicated diverticulosis, or irritable bowel syndrome.  Prevent overeating as part of a weight-loss plan.  Prevent heart disease, type 2 diabetes, and certain cancers. What is my plan? The recommended daily intake of fiber includes:  38 grams for men under age 34.  39 grams for men over age 22.  5 grams for women under age 72.  3 grams for women over age 73. You can get the recommended daily intake of dietary fiber by eating a variety of fruits, vegetables, grains, and beans. Your health care provider may also recommend a fiber supplement if it is not possible to get enough fiber through your diet. What do I need to know about a high-fiber diet?  Fiber supplements have not been widely studied for their effectiveness, so it is better to get fiber through food sources.  Always check the fiber content on thenutrition facts label of any prepackaged food. Look for foods that contain at least 5 grams of fiber per serving.  Ask your dietitian if you have questions about specific foods that are related to your condition, especially if those foods are not listed in the following section.  Increase your daily fiber consumption gradually. Increasing your intake of dietary fiber too quickly may cause bloating, cramping, or gas.  Drink plenty of water. Water helps you to digest fiber. What foods can I eat? Grains  Whole-grain breads. Multigrain cereal. Oats and oatmeal. Brown rice. Barley. Bulgur wheat. [place]. Bran muffins. Popcorn. Rye wafer  crackers. Vegetables  Sweet potatoes. Spinach. Kale. Artichokes. Cabbage. Broccoli. Green peas. Carrots. Squash. Fruits  Berries. Pears. Apples. Oranges. Avocados. Prunes and raisins. Dried figs. Meats and Other Protein Sources  [place], kidney, pinto, and soy beans. Split peas. Lentils. Nuts and seeds. Dairy  Fiber-fortified yogurt. Beverages  Fiber-fortified soy milk. Fiber-fortified orange juice. Other  Fiber bars. The items listed above may not be a complete list of recommended foods or beverages. Contact your dietitian for more options.  What foods are not recommended? Grains  White bread. Pasta made with refined flour. White rice. Vegetables  Fried potatoes. Canned vegetables. Well-cooked vegetables. Fruits  Fruit juice. Cooked, strained fruit. Meats and Other Protein Sources  Fatty cuts of meat. Fried Sales executive or fried fish. Dairy  Milk. Yogurt. Cream cheese. Sour cream. Beverages  Soft drinks. Other  Cakes and pastries. Butter and oils. The items listed above may not be a complete list of foods and beverages to avoid. Contact your dietitian for more information.  What are some tips for including high-fiber foods in my diet?  Eat a wide variety of high-fiber foods.  Make sure that half of all grains consumed each day are whole grains.  Replace breads and cereals made from refined flour or white flour with whole-grain breads and cereals.  Replace white rice with brown rice, bulgur wheat, or millet.  Start the day with a breakfast that is high in fiber, such as a cereal that contains at least 5 grams of fiber per serving.  Use beans in place of meat in soups, salads, or pasta.  Eat high-fiber snacks, such as berries, raw vegetables, nuts, or popcorn. This information is not intended to replace advice given to you by your health care provider. Make sure you discuss any questions you have with your health care provider. Document Released: 01/08/2005 Document Revised:  06/16/2015 Document Reviewed: 06/23/2013 Elsevier Interactive Patient Education  2017 [place] Maintenance, Female Adopting a healthy lifestyle and getting preventive care can go a long way to promote health and wellness. Talk with your health care provider about what schedule of regular examinations is right for you. This is a good chance for you to check in with your provider about disease prevention and staying healthy. In between checkups, there are plenty of things you can do on your own. Experts have done a lot of research about which lifestyle changes and preventive measures are most likely to keep you healthy. Ask your health care provider for more information. Weight and diet Eat a healthy diet  Be sure to include plenty of vegetables, fruits, low-fat dairy products, and lean protein.  Do not eat a lot of foods high in solid fats, added sugars, or salt.  Get regular exercise. This is one of the most important things you can do for your health.  Most adults should exercise for at least 150 minutes each week. The exercise should increase your heart rate and make you sweat (moderate-intensity exercise).  Most adults should also do strengthening exercises at least twice a week. This is in addition to the moderate-intensity exercise. Maintain a healthy weight  Body mass index (BMI) is a measurement that can be used to identify possible weight problems. It estimates body fat based on height and weight. Your health care provider can help determine your BMI and help you achieve or maintain a healthy weight.  For females 68 years of age and older:  A BMI below 18.5 is considered underweight.  A BMI of 18.5 to 24.9 is normal.  A BMI of 25 to 29.9 is considered overweight.  A BMI of 30 and above is considered obese. Watch levels of cholesterol and blood lipids  You should start having your blood tested for lipids and cholesterol at 56 years of age, then have this test  every 5 years.  You may need to have your cholesterol levels checked more often if:  Your lipid or cholesterol levels are high.  You are older than 56 years of age.  You are at high risk for heart disease. Cancer screening Lung Cancer  Lung cancer screening is recommended for adults 45-74 years old who are at high risk for lung cancer because of a history of smoking.  A yearly low-dose CT scan of the lungs is recommended for people who:  Currently smoke.  Have quit within the past 15 years.  Have at least a 30-pack-year history of smoking. A pack year is smoking an average of one pack of cigarettes a day for 1 year.  Yearly screening should continue until it has been 15 years since you quit.  Yearly screening should stop if you develop a health problem that would prevent you from having lung cancer treatment. Breast Cancer  Practice breast self-awareness. This means understanding how your breasts normally appear and feel.  It also means doing regular breast self-exams. Let your health care provider know about any changes, no matter how small.  If you are in your 20s or 30s, you should have a clinical breast exam (CBE) by a health care provider every 1-3  years as part of a regular health exam.  If you are 81 or older, have a CBE every year. Also consider having a breast X-ray (mammogram) every year.  If you have a family history of breast cancer, talk to your health care provider about genetic screening.  If you are at high risk for breast cancer, talk to your health care provider about having an MRI and a mammogram every year.  Breast cancer gene (BRCA) assessment is recommended for women who have family members with BRCA-related cancers. BRCA-related cancers include:  Breast.  Ovarian.  Tubal.  Peritoneal cancers.  Results of the assessment will determine the need for genetic counseling and BRCA1 and BRCA2 testing. Cervical Cancer  Your health care provider may  recommend that you be screened regularly for cancer of the pelvic organs (ovaries, uterus, and vagina). This screening involves a pelvic examination, including checking for microscopic changes to the surface of your cervix (Pap test). You may be encouraged to have this screening done every 3 years, beginning at age 13.  For women ages 15-65, health care providers may recommend pelvic exams and Pap testing every 3 years, or they may recommend the Pap and pelvic exam, combined with testing for human papilloma virus (HPV), every 5 years. Some types of HPV increase your risk of cervical cancer. Testing for HPV may also be done on women of any age with unclear Pap test results.  Other health care providers may not recommend any screening for nonpregnant women who are considered low risk for pelvic cancer and who do not have symptoms. Ask your health care provider if a screening pelvic exam is right for you.  If you have had past treatment for cervical cancer or a condition that could lead to cancer, you need Pap tests and screening for cancer for at least 20 years after your treatment. If Pap tests have been discontinued, your risk factors (such as having a new sexual partner) need to be reassessed to determine if screening should resume. Some women have medical problems that increase the chance of getting cervical cancer. In these cases, your health care provider may recommend more frequent screening and Pap tests. Colorectal Cancer  This type of cancer can be detected and often prevented.  Routine colorectal cancer screening usually begins at 56 years of age and continues through 56 years of age.  Your health care provider may recommend screening at an earlier age if you have risk factors for colon cancer.  Your health care provider may also recommend using home test kits to check for hidden blood in the stool.  A small camera at the end of a tube can be used to examine your colon directly  (sigmoidoscopy or colonoscopy). This is done to check for the earliest forms of colorectal cancer.  Routine screening usually begins at age 39.  Direct examination of the colon should be repeated every 5-10 years through 56 years of age. However, you may need to be screened more often if early forms of precancerous polyps or small growths are found. Skin Cancer  Check your skin from head to toe regularly.  Tell your health care provider about any new moles or changes in moles, especially if there is a change in a mole's shape or color.  Also tell your health care provider if you have a mole that is larger than the size of a pencil eraser.  Always use sunscreen. Apply sunscreen liberally and repeatedly throughout the day.  Protect yourself by wearing  long sleeves, pants, a wide-brimmed hat, and sunglasses whenever you are outside. Heart disease, diabetes, and high blood pressure  High blood pressure causes heart disease and increases the risk of stroke. High blood pressure is more likely to develop in:  People who have blood pressure in the high end of the normal range (130-139/85-89 mm Hg).  People who are overweight or obese.  People who are African American.  If you are 57-31 years of age, have your blood pressure checked every 3-5 years. If you are 109 years of age or older, have your blood pressure checked every year. You should have your blood pressure measured twice-once when you are at a hospital or clinic, and once when you are not at a hospital or clinic. Record the average of the two measurements. To check your blood pressure when you are not at a hospital or clinic, you can use:  An automated blood pressure machine at a pharmacy.  A home blood pressure monitor.  If you are between 70 years and 58 years old, ask your health care provider if you should take aspirin to prevent strokes.  Have regular diabetes screenings. This involves taking a blood sample to check your  fasting blood sugar level.  If you are at a normal weight and have a low risk for diabetes, have this test once every three years after 56 years of age.  If you are overweight and have a high risk for diabetes, consider being tested at a younger age or more often. Preventing infection Hepatitis B  If you have a higher risk for hepatitis B, you should be screened for this virus. You are considered at high risk for hepatitis B if:  You were born in a country where hepatitis B is common. Ask your health care provider which countries are considered high risk.  Your parents were born in a high-risk country, and you have not been immunized against hepatitis B (hepatitis B vaccine).  You have HIV or AIDS.  You use needles to inject street drugs.  You live with someone who has hepatitis B.  You have had sex with someone who has hepatitis B.  You get hemodialysis treatment.  You take certain medicines for conditions, including cancer, organ transplantation, and autoimmune conditions. Hepatitis C  Blood testing is recommended for:  Everyone born from [birth year] through 1965.  Anyone with known risk factors for hepatitis C. Sexually transmitted infections (STIs)  You should be screened for sexually transmitted infections (STIs) including gonorrhea and chlamydia if:  You are sexually active and are younger than 56 years of age.  You are older than 56 years of age and your health care provider tells you that you are at risk for this type of infection.  Your sexual activity has changed since you were last screened and you are at an increased risk for chlamydia or gonorrhea. Ask your health care provider if you are at risk.  If you do not have HIV, but are at risk, it may be recommended that you take a prescription medicine daily to prevent HIV infection. This is called pre-exposure prophylaxis (PrEP). You are considered at risk if:  You are sexually active and do not regularly use condoms or  know the HIV status of your partner(s).  You take drugs by injection.  You are sexually active with a partner who has HIV. Talk with your health care provider about whether you are at high risk of being infected with HIV. If you choose to  begin PrEP, you should first be tested for HIV. You should then be tested every 3 months for as long as you are taking PrEP. Pregnancy  If you are premenopausal and you may become pregnant, ask your health care provider about preconception counseling.  If you may become pregnant, take 400 to 800 micrograms (mcg) of folic acid every day.  If you want to prevent pregnancy, talk to your health care provider about birth control (contraception). Osteoporosis and menopause  Osteoporosis is a disease in which the bones lose minerals and strength with aging. This can result in serious bone fractures. Your risk for osteoporosis can be identified using a bone density scan.  If you are 67 years of age or older, or if you are at risk for osteoporosis and fractures, ask your health care provider if you should be screened.  Ask your health care provider whether you should take a calcium or vitamin D supplement to lower your risk for osteoporosis.  Menopause may have certain physical symptoms and risks.  Hormone replacement therapy may reduce some of these symptoms and risks. Talk to your health care provider about whether hormone replacement therapy is right for you. Follow these instructions at home:  Schedule regular health, dental, and eye exams.  Stay current with your immunizations.  Do not use any tobacco products including cigarettes, chewing tobacco, or electronic cigarettes.  If you are pregnant, do not drink alcohol.  If you are breastfeeding, limit how much and how often you drink alcohol.  Limit alcohol intake to no more than 1 drink per day for nonpregnant women. One drink equals 12 ounces of beer, 5 ounces of wine, or 1 ounces of hard  liquor.  Do not use street drugs.  Do not share needles.  Ask your health care provider for help if you need support or information about quitting drugs.  Tell your health care provider if you often feel depressed.  Tell your health care provider if you have ever been abused or do not feel safe at home. This information is not intended to replace advice given to you by your health care provider. Make sure you discuss any questions you have with your health care provider. Document Released: 07/24/2010 Document Revised: 06/16/2015 Document Reviewed: 10/12/2014 Elsevier Interactive Patient Education  2017 [employer].

## 2016-04-11 NOTE — Assessment & Plan Note (Signed)
Smoking 1 PPD and no intention of quitting at this time. Reminded her of the risks and harms from smoking including her past stroke.

## 2016-04-11 NOTE — Assessment & Plan Note (Signed)
BP at goal on her acetazolamide. Checking CMP and adjust as needed.

## 2016-04-11 NOTE — Assessment & Plan Note (Signed)
Talked to her about weight management to help avoid future health problems including worsening of her arthritis and decreasing risk of stroke. Not exercising currently.

## 2016-04-11 NOTE — Assessment & Plan Note (Signed)
Colonoscopy up to date. Mammogram reminded given today (gets with gyn). Declines flu shot and HIV screening. Hep c screening done today. Tetanus up to date. Counseled on sun safety and mole surveillance as well as the dangers of distracted driving. Given screening recommendations.

## 2016-04-12 LAB — HEPATITIS C ANTIBODY: HCV Ab: NEGATIVE

## 2016-04-15 ENCOUNTER — Other Ambulatory Visit: Payer: Self-pay | Admitting: Internal Medicine

## 2016-04-15 DIAGNOSIS — F4323 Adjustment disorder with mixed anxiety and depressed mood: Secondary | ICD-10-CM

## 2016-04-26 ENCOUNTER — Ambulatory Visit: Payer: BLUE CROSS/BLUE SHIELD | Admitting: Neurology

## 2016-05-08 ENCOUNTER — Ambulatory Visit: Payer: BLUE CROSS/BLUE SHIELD | Admitting: Neurology

## 2016-05-09 IMAGING — CT CT HEAD W/O CM
1 series · 16 of 30 positions shown, 20 images · non-contrast
Comparison: None.

CLINICAL DATA: Vision changes, eye pain.

EXAM:
CT HEAD WITHOUT CONTRAST
TECHNIQUE: Contiguous axial images were obtained from the base of the skull
through the vertex without intravenous contrast.

[Series 2: head 5.0 h30s · axial · 0.41mm/px · z∈[+468,+608]mm · 16 of 32 slices shown, 20 images]
[im 2/32  brain]
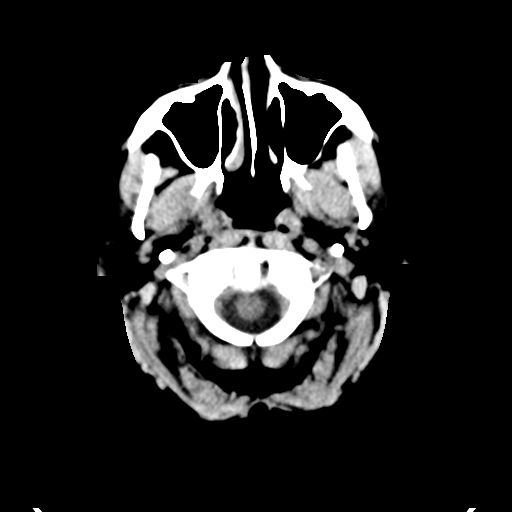
[im 2/32  bone]
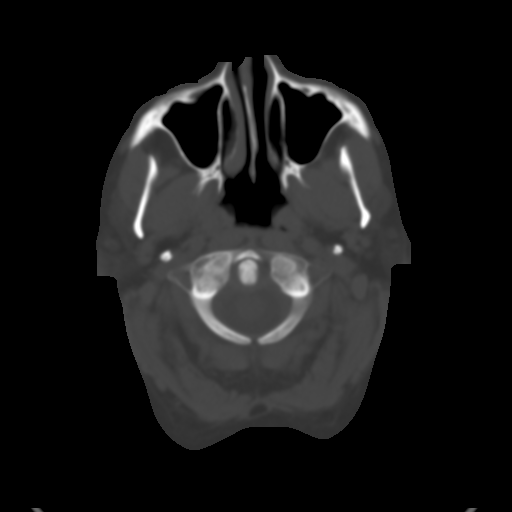
[im 4/32  brain]
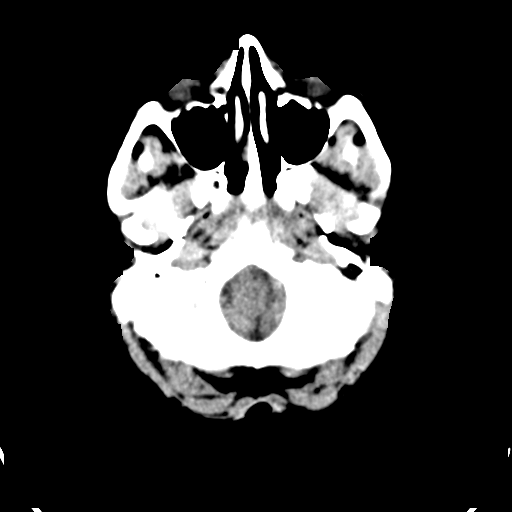
[im 6/32  brain]
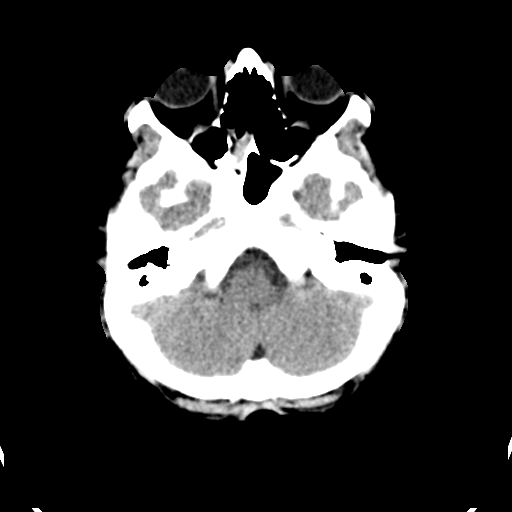
[im 8/32  brain]
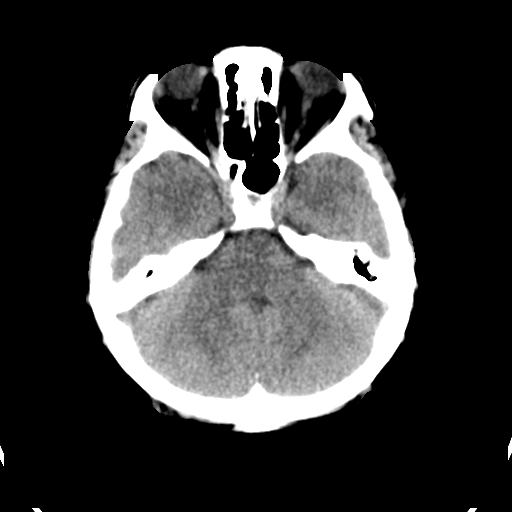
[im 9/32  brain]
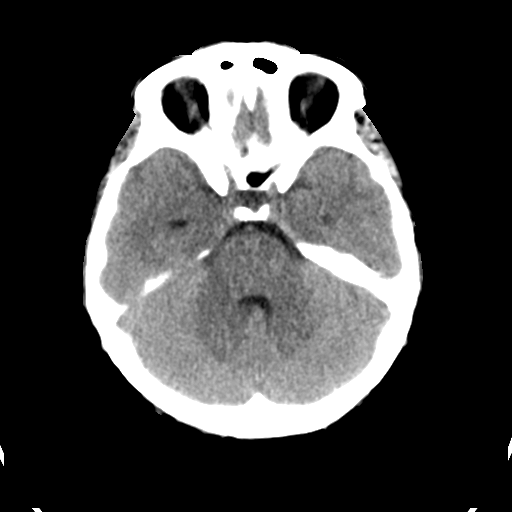
[im 9/32  bone]
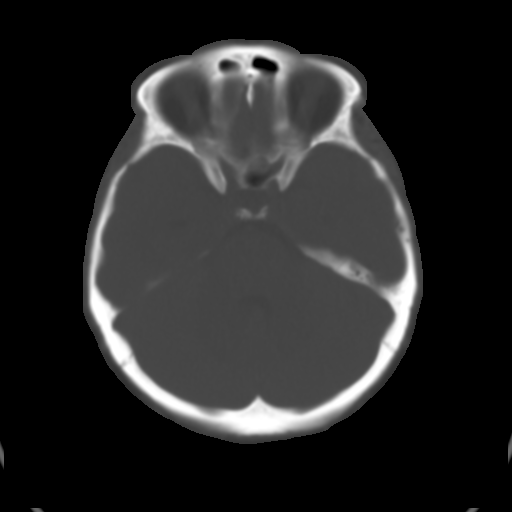
[im 11/32  brain]
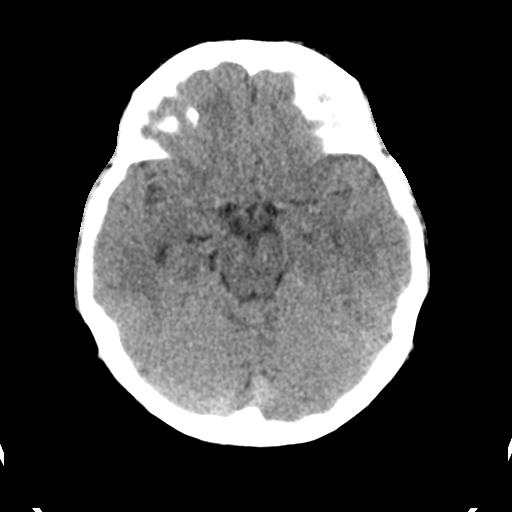
[im 13/32  brain]
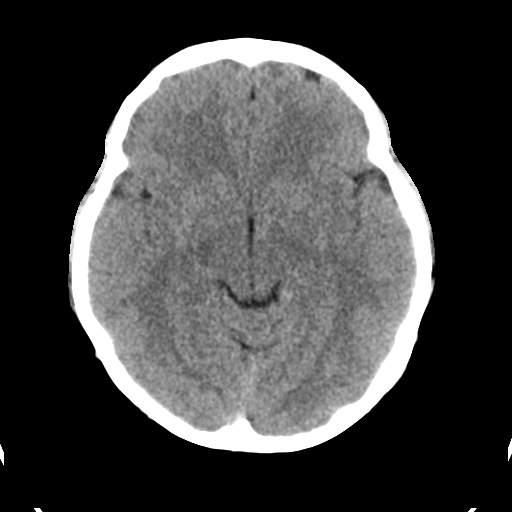
[im 15/32  brain]
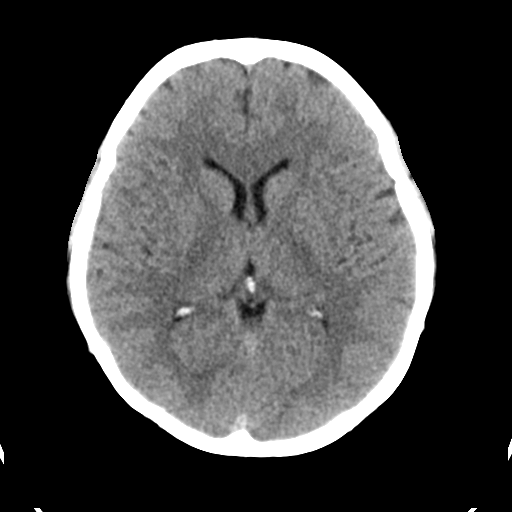
[im 17/32  brain]
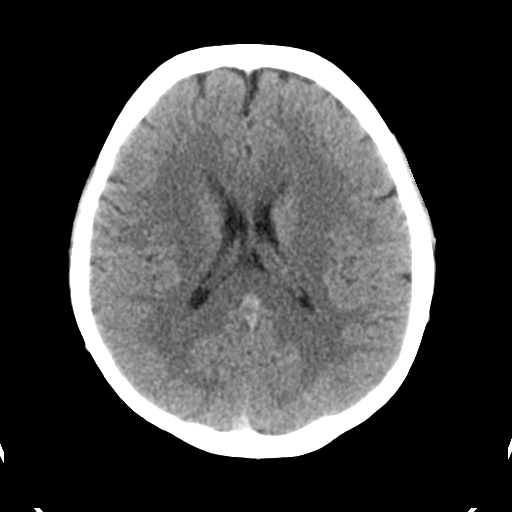
[im 17/32  bone]
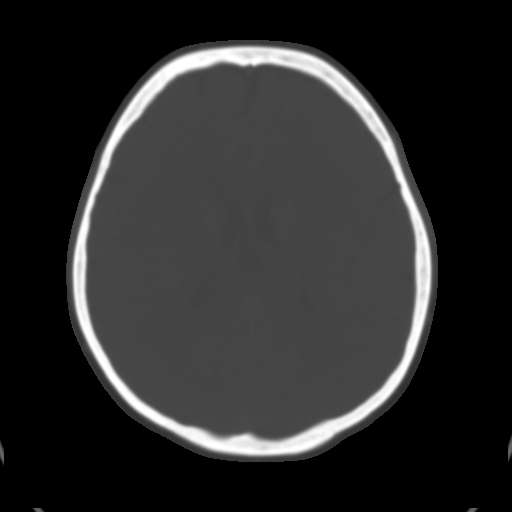
[im 19/32  brain]
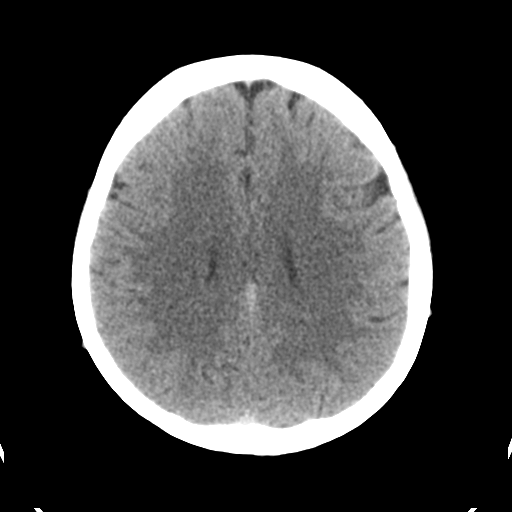
[im 21/32  brain]
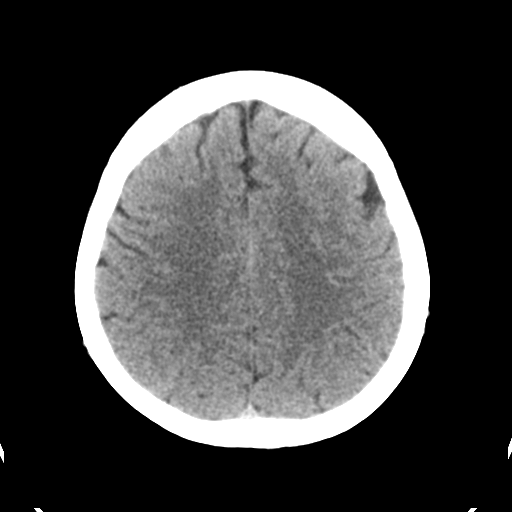
[im 23/32  brain]
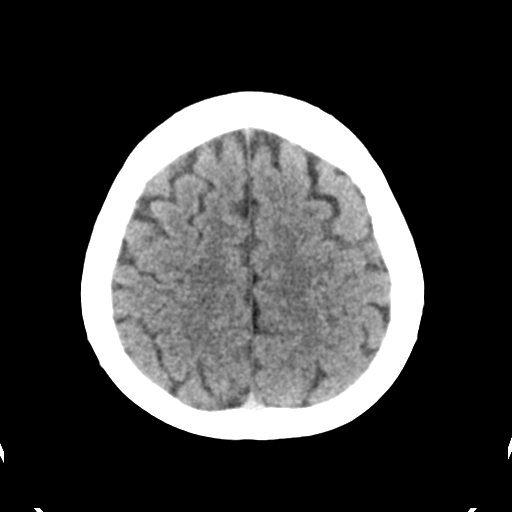
[im 24/32  brain]
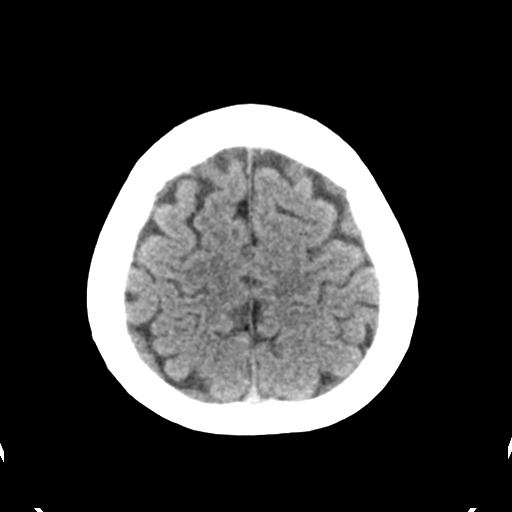
[im 24/32  bone]
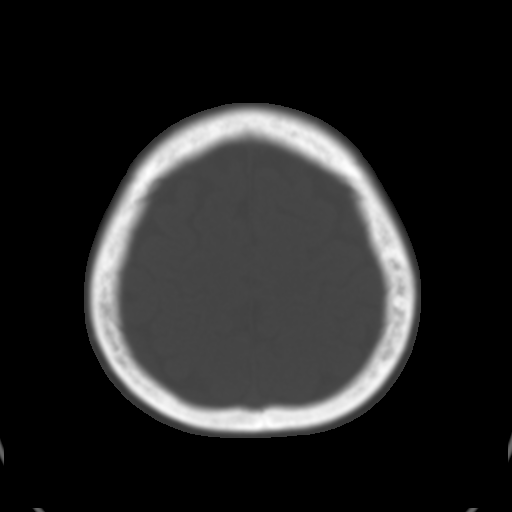
[im 26/32  brain]
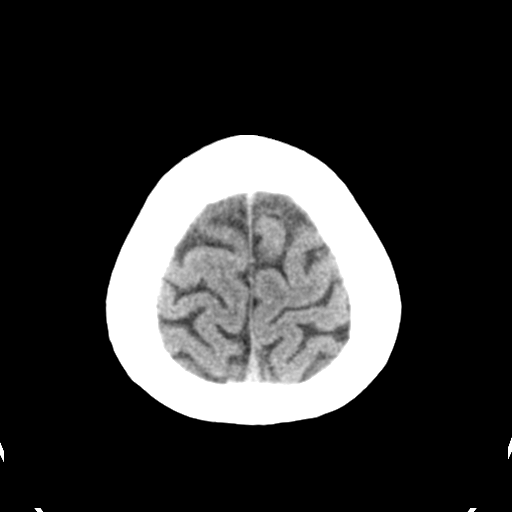
[im 28/32  brain]
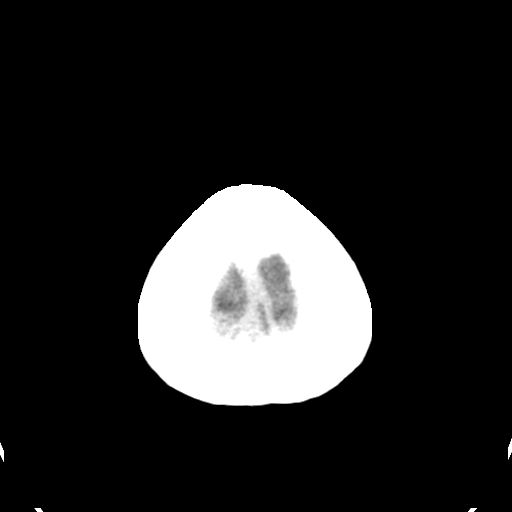
[im 30/32  brain]
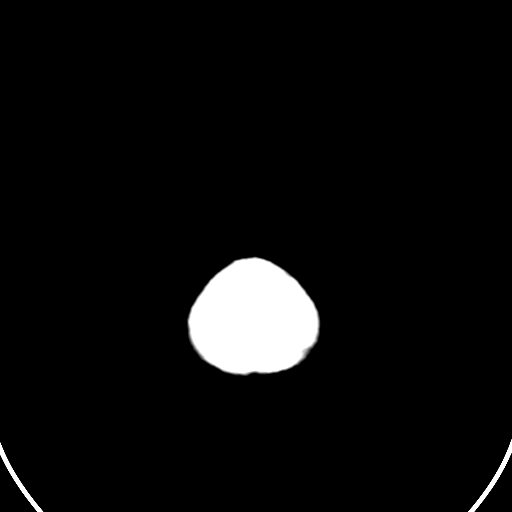

[16 of 30 positions shown; findings below may reference images not displayed]

FINDINGS: There is no acute intracranial hemorrhage or infarct. No mass lesion
or midline shift. Gray-white matter differentiation is well
maintained. Ventricles are normal in size without evidence of
hydrocephalus. CSF containing spaces are within normal limits. No
extra-axial fluid collection.

The calvarium is intact.

Orbital soft tissues are within normal limits.

There is near complete opacification of the right sphenoid sinus and
right sphenoid ethmoidal recess. Otherwise, the paranasal sinuses
and mastoid air cells are well pneumatized and free of fluid.

Scalp soft tissues are unremarkable.
IMPRESSION: 1. Normal head CT with no acute intracranial abnormality identified.
2. Right sphenoid sinus disease.

## 2016-06-08 ENCOUNTER — Other Ambulatory Visit: Payer: Self-pay | Admitting: Internal Medicine

## 2016-06-23 ENCOUNTER — Other Ambulatory Visit: Payer: Self-pay | Admitting: Internal Medicine

## 2016-10-11 ENCOUNTER — Other Ambulatory Visit: Payer: Self-pay | Admitting: Internal Medicine

## 2016-10-11 DIAGNOSIS — F4323 Adjustment disorder with mixed anxiety and depressed mood: Secondary | ICD-10-CM

## 2016-11-25 ENCOUNTER — Other Ambulatory Visit: Payer: Self-pay | Admitting: Internal Medicine

## 2016-12-16 ENCOUNTER — Other Ambulatory Visit: Payer: Self-pay | Admitting: Internal Medicine

## 2016-12-17 NOTE — Telephone Encounter (Signed)
Last filled on 06/08/16 #180 with 1 additional refill

## 2016-12-24 ENCOUNTER — Other Ambulatory Visit: Payer: Self-pay | Admitting: Internal Medicine

## 2017-02-13 ENCOUNTER — Other Ambulatory Visit (INDEPENDENT_AMBULATORY_CARE_PROVIDER_SITE_OTHER): Payer: BLUE CROSS/BLUE SHIELD

## 2017-02-13 ENCOUNTER — Ambulatory Visit: Payer: BLUE CROSS/BLUE SHIELD | Admitting: Internal Medicine

## 2017-02-13 ENCOUNTER — Encounter: Payer: Self-pay | Admitting: Internal Medicine

## 2017-02-13 VITALS — BP 110/78 | HR 65 | Temp 97.6°F | Ht 58.5 in | Wt 140.0 lb

## 2017-02-13 DIAGNOSIS — Z0000 Encounter for general adult medical examination without abnormal findings: Secondary | ICD-10-CM

## 2017-02-13 DIAGNOSIS — E785 Hyperlipidemia, unspecified: Secondary | ICD-10-CM

## 2017-02-13 DIAGNOSIS — F17200 Nicotine dependence, unspecified, uncomplicated: Secondary | ICD-10-CM | POA: Diagnosis not present

## 2017-02-13 DIAGNOSIS — I10 Essential (primary) hypertension: Secondary | ICD-10-CM

## 2017-02-13 DIAGNOSIS — G932 Benign intracranial hypertension: Secondary | ICD-10-CM

## 2017-02-13 DIAGNOSIS — F4323 Adjustment disorder with mixed anxiety and depressed mood: Secondary | ICD-10-CM

## 2017-02-13 LAB — CBC
HCT: 39.2 % (ref 36.0–46.0)
Hemoglobin: 13.3 g/dL (ref 12.0–15.0)
MCHC: 33.9 g/dL (ref 30.0–36.0)
MCV: 91.1 fl (ref 78.0–100.0)
PLATELETS: 272 10*3/uL (ref 150.0–400.0)
RBC: 4.3 Mil/uL (ref 3.87–5.11)
RDW: 13 % (ref 11.5–15.5)
WBC: 12.3 10*3/uL — ABNORMAL HIGH (ref 4.0–10.5)

## 2017-02-13 LAB — COMPREHENSIVE METABOLIC PANEL
ALT: 11 U/L (ref 0–35)
AST: 15 U/L (ref 0–37)
Albumin: 4.1 g/dL (ref 3.5–5.2)
Alkaline Phosphatase: 48 U/L (ref 39–117)
BUN: 22 mg/dL (ref 6–23)
CHLORIDE: 115 meq/L — AB (ref 96–112)
CO2: 17 meq/L — AB (ref 19–32)
CREATININE: 0.84 mg/dL (ref 0.40–1.20)
Calcium: 9.1 mg/dL (ref 8.4–10.5)
GFR: 74.47 mL/min (ref >60.00)
Glucose, Bld: 88 mg/dL (ref 70–99)
POTASSIUM: 3.9 meq/L (ref 3.5–5.1)
Sodium: 141 mEq/L (ref 135–145)
Total Bilirubin: 0.4 mg/dL (ref 0.2–1.2)
Total Protein: 6.9 g/dL (ref 6.0–8.3)

## 2017-02-13 LAB — LIPID PANEL
CHOL/HDL RATIO: 3
Cholesterol: 125 mg/dL (ref 0–200)
HDL: 36.5 mg/dL — AB (ref >39.00)
LDL CALC: 66 mg/dL (ref 0–99)
NonHDL: 88.76
Triglycerides: 113 mg/dL (ref 0.0–149.0)
VLDL: 22.6 mg/dL (ref 0.0–40.0)

## 2017-02-13 LAB — HEMOGLOBIN A1C: Hgb A1c MFr Bld: 5.3 % (ref 4.6–6.5)

## 2017-02-13 MED ORDER — VALACYCLOVIR HCL 1 G PO TABS: 2000.0000 mg | ORAL_TABLET | Freq: Two times a day (BID) | ORAL | 2 refills | Status: DC

## 2017-02-13 NOTE — Progress Notes (Signed)
   Subjective:    Patient ID: Stephanie Booker, female    DOB: 07/27/1960, 57 y.o.   MRN: [id number]  HPI The patient is a 57 YO female coming in for physical.  [facility], social history reviewed and updated.   Review of Systems  Constitutional: Negative.   HENT: Negative.   Eyes: Negative.   Respiratory: Negative for cough, chest tightness and shortness of breath.   Cardiovascular: Negative for chest pain, palpitations and leg swelling.  Gastrointestinal: Negative for abdominal distention, abdominal pain, constipation, diarrhea, nausea and vomiting.  Musculoskeletal: Negative.   Skin: Negative.   Neurological: Negative.   Psychiatric/Behavioral: Negative.       Objective:   Physical Exam  Constitutional: She is oriented to person, place, and time. She appears well-developed and well-nourished.  HENT:  Head: Normocephalic and atraumatic.  Eyes: EOM are normal.  Neck: Normal range of motion.  Cardiovascular: Normal rate and regular rhythm.  Pulmonary/Chest: Effort normal and breath sounds normal. No respiratory distress. She has no wheezes. She has no rales.  Abdominal: Soft. Bowel sounds are normal. She exhibits no distension. There is no tenderness. There is no rebound.  Musculoskeletal: She exhibits no edema.  Neurological: She is alert and oriented to person, place, and time. Coordination normal.  Skin: Skin is warm and dry.  Psychiatric: She has a normal mood and affect.    Vitals:   02/13/17 0955  BP: 110/78  Pulse: 65  Temp: 97.6 F (36.4 C)  TempSrc: Oral  SpO2: 99%  Weight: 140 lb (63.5 kg)  Height: 4' 10.5" (1.486 m)      Assessment & Plan:

## 2017-02-13 NOTE — Patient Instructions (Signed)
We will check the labs today and have added you to the shingles waiting list.   Health Maintenance, Female Adopting a healthy lifestyle and getting preventive care can go a long way to promote health and wellness. Talk with your health care provider about what schedule of regular examinations is right for you. This is a good chance for you to check in with your provider about disease prevention and staying healthy. In between checkups, there are plenty of things you can do on your own. Experts have done a lot of research about which lifestyle changes and preventive measures are most likely to keep you healthy. Ask your health care provider for more information. Weight and diet Eat a healthy diet  Be sure to include plenty of vegetables, fruits, low-fat dairy products, and lean protein.  Do not eat a lot of foods high in solid fats, added sugars, or salt.  Get regular exercise. This is one of the most important things you can do for your health. ? Most adults should exercise for at least 150 minutes each week. The exercise should increase your heart rate and make you sweat (moderate-intensity exercise). ? Most adults should also do strengthening exercises at least twice a week. This is in addition to the moderate-intensity exercise.  Maintain a healthy weight  Body mass index (BMI) is a measurement that can be used to identify possible weight problems. It estimates body fat based on height and weight. Your health care provider can help determine your BMI and help you achieve or maintain a healthy weight.  For females 8 years of age and older: ? A BMI below 18.5 is considered underweight. ? A BMI of 18.5 to 24.9 is normal. ? A BMI of 25 to 29.9 is considered overweight. ? A BMI of 30 and above is considered obese.  Watch levels of cholesterol and blood lipids  You should start having your blood tested for lipids and cholesterol at 57 years of age, then have this test every 5 years.  You  may need to have your cholesterol levels checked more often if: ? Your lipid or cholesterol levels are high. ? You are older than 57 years of age. ? You are at high risk for heart disease.  Cancer screening Lung Cancer  Lung cancer screening is recommended for adults 78-46 years old who are at high risk for lung cancer because of a history of smoking.  A yearly low-dose CT scan of the lungs is recommended for people who: ? Currently smoke. ? Have quit within the past 15 years. ? Have at least a 30-pack-year history of smoking. A pack year is smoking an average of one pack of cigarettes a day for 1 year.  Yearly screening should continue until it has been 15 years since you quit.  Yearly screening should stop if you develop a health problem that would prevent you from having lung cancer treatment.  Breast Cancer  Practice breast self-awareness. This means understanding how your breasts normally appear and feel.  It also means doing regular breast self-exams. Let your health care provider know about any changes, no matter how small.  If you are in your 20s or 30s, you should have a clinical breast exam (CBE) by a health care provider every 1-3 years as part of a regular health exam.  If you are 93 or older, have a CBE every year. Also consider having a breast X-ray (mammogram) every year.  If you have a family history of breast  cancer, talk to your health care provider about genetic screening.  If you are at high risk for breast cancer, talk to your health care provider about having an MRI and a mammogram every year.  Breast cancer gene (BRCA) assessment is recommended for women who have family members with BRCA-related cancers. BRCA-related cancers include: ? Breast. ? Ovarian. ? Tubal. ? Peritoneal cancers.  Results of the assessment will determine the need for genetic counseling and BRCA1 and BRCA2 testing.  Cervical Cancer Your health care provider may recommend that you  be screened regularly for cancer of the pelvic organs (ovaries, uterus, and vagina). This screening involves a pelvic examination, including checking for microscopic changes to the surface of your cervix (Pap test). You may be encouraged to have this screening done every 3 years, beginning at age 48.  For women ages 45-65, health care providers may recommend pelvic exams and Pap testing every 3 years, or they may recommend the Pap and pelvic exam, combined with testing for human papilloma virus (HPV), every 5 years. Some types of HPV increase your risk of cervical cancer. Testing for HPV may also be done on women of any age with unclear Pap test results.  Other health care providers may not recommend any screening for nonpregnant women who are considered low risk for pelvic cancer and who do not have symptoms. Ask your health care provider if a screening pelvic exam is right for you.  If you have had past treatment for cervical cancer or a condition that could lead to cancer, you need Pap tests and screening for cancer for at least 20 years after your treatment. If Pap tests have been discontinued, your risk factors (such as having a new sexual partner) need to be reassessed to determine if screening should resume. Some women have medical problems that increase the chance of getting cervical cancer. In these cases, your health care provider may recommend more frequent screening and Pap tests.  Colorectal Cancer  This type of cancer can be detected and often prevented.  Routine colorectal cancer screening usually begins at 57 years of age and continues through 57 years of age.  Your health care provider may recommend screening at an earlier age if you have risk factors for colon cancer.  Your health care provider may also recommend using home test kits to check for hidden blood in the stool.  A small camera at the end of a tube can be used to examine your colon directly (sigmoidoscopy or  colonoscopy). This is done to check for the earliest forms of colorectal cancer.  Routine screening usually begins at age 38.  Direct examination of the colon should be repeated every 5-10 years through 57 years of age. However, you may need to be screened more often if early forms of precancerous polyps or small growths are found.  Skin Cancer  Check your skin from head to toe regularly.  Tell your health care provider about any new moles or changes in moles, especially if there is a change in a mole's shape or color.  Also tell your health care provider if you have a mole that is larger than the size of a pencil eraser.  Always use sunscreen. Apply sunscreen liberally and repeatedly throughout the day.  Protect yourself by wearing long sleeves, pants, a wide-brimmed hat, and sunglasses whenever you are outside.  Heart disease, diabetes, and high blood pressure  High blood pressure causes heart disease and increases the risk of stroke. High blood pressure  is more likely to develop in: ? People who have blood pressure in the high end of the normal range (130-139/85-89 mm Hg). ? People who are overweight or obese. ? People who are African American.  If you are 34-89 years of age, have your blood pressure checked every 3-5 years. If you are 49 years of age or older, have your blood pressure checked every year. You should have your blood pressure measured twice-once when you are at a hospital or clinic, and once when you are not at a hospital or clinic. Record the average of the two measurements. To check your blood pressure when you are not at a hospital or clinic, you can use: ? An automated blood pressure machine at a pharmacy. ? A home blood pressure monitor.  If you are between 85 years and 103 years old, ask your health care provider if you should take aspirin to prevent strokes.  Have regular diabetes screenings. This involves taking a blood sample to check your fasting blood sugar  level. ? If you are at a normal weight and have a low risk for diabetes, have this test once every three years after 57 years of age. ? If you are overweight and have a high risk for diabetes, consider being tested at a younger age or more often. Preventing infection Hepatitis B  If you have a higher risk for hepatitis B, you should be screened for this virus. You are considered at high risk for hepatitis B if: ? You were born in a country where hepatitis B is common. Ask your health care provider which countries are considered high risk. ? Your parents were born in a high-risk country, and you have not been immunized against hepatitis B (hepatitis B vaccine). ? You have HIV or AIDS. ? You use needles to inject street drugs. ? You live with someone who has hepatitis B. ? You have had sex with someone who has hepatitis B. ? You get hemodialysis treatment. ? You take certain medicines for conditions, including cancer, organ transplantation, and autoimmune conditions.  Hepatitis C  Blood testing is recommended for: ? Everyone born from [birth year] through 1965. ? Anyone with known risk factors for hepatitis C.  Sexually transmitted infections (STIs)  You should be screened for sexually transmitted infections (STIs) including gonorrhea and chlamydia if: ? You are sexually active and are younger than 57 years of age. ? You are older than 57 years of age and your health care provider tells you that you are at risk for this type of infection. ? Your sexual activity has changed since you were last screened and you are at an increased risk for chlamydia or gonorrhea. Ask your health care provider if you are at risk.  If you do not have HIV, but are at risk, it may be recommended that you take a prescription medicine daily to prevent HIV infection. This is called pre-exposure prophylaxis (PrEP). You are considered at risk if: ? You are sexually active and do not regularly use condoms or know the HIV  status of your partner(s). ? You take drugs by injection. ? You are sexually active with a partner who has HIV.  Talk with your health care provider about whether you are at high risk of being infected with HIV. If you choose to begin PrEP, you should first be tested for HIV. You should then be tested every 3 months for as long as you are taking PrEP. Pregnancy  If you are premenopausal and  you may become pregnant, ask your health care provider about preconception counseling.  If you may become pregnant, take 400 to 800 micrograms (mcg) of folic acid every day.  If you want to prevent pregnancy, talk to your health care provider about birth control (contraception). Osteoporosis and menopause  Osteoporosis is a disease in which the bones lose minerals and strength with aging. This can result in serious bone fractures. Your risk for osteoporosis can be identified using a bone density scan.  If you are 14 years of age or older, or if you are at risk for osteoporosis and fractures, ask your health care provider if you should be screened.  Ask your health care provider whether you should take a calcium or vitamin D supplement to lower your risk for osteoporosis.  Menopause may have certain physical symptoms and risks.  Hormone replacement therapy may reduce some of these symptoms and risks. Talk to your health care provider about whether hormone replacement therapy is right for you. Follow these instructions at home:  Schedule regular health, dental, and eye exams.  Stay current with your immunizations.  Do not use any tobacco products including cigarettes, chewing tobacco, or electronic cigarettes.  If you are pregnant, do not drink alcohol.  If you are breastfeeding, limit how much and how often you drink alcohol.  Limit alcohol intake to no more than 1 drink per day for nonpregnant women. One drink equals 12 ounces of beer, 5 ounces of wine, or 1 ounces of hard liquor.  Do not  use street drugs.  Do not share needles.  Ask your health care provider for help if you need support or information about quitting drugs.  Tell your health care provider if you often feel depressed.  Tell your health care provider if you have ever been abused or do not feel safe at home. This information is not intended to replace advice given to you by your health care provider. Make sure you discuss any questions you have with your health care provider. Document Released: 07/24/2010 Document Revised: 06/16/2015 Document Reviewed: 10/12/2014 Elsevier Interactive Patient Education  [employer].

## 2017-02-15 NOTE — Assessment & Plan Note (Signed)
Time spent counseling about tobacco usage: 3 minutes. I have asked about smoking and is smoking less than usual. The patient is advised to quit. The patient is not willing to quit. They would like to try to quit in the next 6 months. We will follow up with them in 6 months.  

## 2017-02-15 NOTE — Assessment & Plan Note (Signed)
Reminded about the need for mammogram. Declines HIV screening and flu shot. Counseled about shingrix and added to the waiting list. Tetanus up to date. Colonoscopy up to date. Pap smear not indicated. Counseled about sun safety and mole surveillance. Given screening recommendations.

## 2017-02-15 NOTE — Assessment & Plan Note (Signed)
Checking lipid panel and adjust lipitor as needed.  

## 2017-02-15 NOTE — Assessment & Plan Note (Signed)
BP at goal on diamox. Checking CMP and adjust as needed. Goal lower with past stroke.

## 2017-02-15 NOTE — Assessment & Plan Note (Signed)
Taking diamox and checking CMP and adjust as needed.

## 2017-02-16 NOTE — Assessment & Plan Note (Signed)
Continues to take lexapro which is helping some, she declines offer to change today. No SI/HI.

## 2017-03-03 ENCOUNTER — Other Ambulatory Visit: Payer: Self-pay | Admitting: Internal Medicine

## 2017-03-17 ENCOUNTER — Other Ambulatory Visit: Payer: Self-pay | Admitting: Family

## 2017-03-17 ENCOUNTER — Other Ambulatory Visit: Payer: Self-pay | Admitting: Internal Medicine

## 2017-03-17 DIAGNOSIS — Z720 Tobacco use: Secondary | ICD-10-CM

## 2017-03-17 DIAGNOSIS — F4323 Adjustment disorder with mixed anxiety and depressed mood: Secondary | ICD-10-CM

## 2017-04-23 ENCOUNTER — Telehealth: Payer: Self-pay | Admitting: Internal Medicine

## 2017-04-23 MED ORDER — VARENICLINE TARTRATE 0.5 MG X 11 & 1 MG X 42 PO MISC: ORAL | 0 refills | Status: DC

## 2017-04-23 MED ORDER — VARENICLINE TARTRATE 1 MG PO TABS: 1.0000 mg | ORAL_TABLET | Freq: Two times a day (BID) | ORAL | 2 refills | Status: DC

## 2017-04-23 NOTE — Telephone Encounter (Signed)
Copied from CRM [phone]. Topic: General - Other >> Apr 23, 2017 11:47 AM Cecelia ByarsGreen, Bular Hickok L, RMA wrote: Patient is requesting a refill for Chantix 1 mg [place] [facility] [place] [place]

## 2017-04-23 NOTE — Telephone Encounter (Signed)
Patient informed of MD response  

## 2017-04-23 NOTE — Telephone Encounter (Signed)
If she is wanting to quit smoking with chantix needs to do started pack (follow directions for titration). Then use continuing months for another 2 moths for best success. Needs to quit within first month of chantix.

## 2017-04-23 NOTE — Telephone Encounter (Signed)
Chantix refill Last OV: 02/13/17  Last Refill:07/24/13 [facility]acy[facility] [place] PCP: Dr Okey Duprerawford

## 2017-06-02 ENCOUNTER — Other Ambulatory Visit: Payer: Self-pay | Admitting: Internal Medicine

## 2017-06-03 LAB — HM MAMMOGRAPHY

## 2017-06-18 ENCOUNTER — Other Ambulatory Visit: Payer: Self-pay | Admitting: Internal Medicine

## 2017-07-26 ENCOUNTER — Encounter: Payer: Self-pay | Admitting: Internal Medicine

## 2017-07-26 NOTE — Progress Notes (Signed)
Abstracted and sent to scan  

## 2017-08-10 ENCOUNTER — Other Ambulatory Visit: Payer: Self-pay | Admitting: Internal Medicine

## 2017-08-13 ENCOUNTER — Ambulatory Visit: Payer: BLUE CROSS/BLUE SHIELD | Admitting: Internal Medicine

## 2017-08-14 ENCOUNTER — Other Ambulatory Visit: Payer: Self-pay | Admitting: Internal Medicine

## 2017-08-14 NOTE — Telephone Encounter (Signed)
What are we asking? I just sent in 3 month supply of this. It is BID medication and 180 needed for 3 month supply why are they wanting this changed to 168?

## 2017-10-01 ENCOUNTER — Other Ambulatory Visit: Payer: Self-pay | Admitting: Internal Medicine

## 2017-10-01 DIAGNOSIS — F4323 Adjustment disorder with mixed anxiety and depressed mood: Secondary | ICD-10-CM

## 2017-11-20 ENCOUNTER — Ambulatory Visit: Payer: BLUE CROSS/BLUE SHIELD | Admitting: Internal Medicine

## 2017-11-20 ENCOUNTER — Encounter: Payer: Self-pay | Admitting: Internal Medicine

## 2017-11-20 VITALS — BP 110/60 | HR 61 | Temp 97.5°F | Ht 58.5 in | Wt 144.0 lb

## 2017-11-20 DIAGNOSIS — Z23 Encounter for immunization: Secondary | ICD-10-CM | POA: Diagnosis not present

## 2017-11-20 DIAGNOSIS — G4733 Obstructive sleep apnea (adult) (pediatric): Secondary | ICD-10-CM | POA: Diagnosis not present

## 2017-11-20 DIAGNOSIS — F4323 Adjustment disorder with mixed anxiety and depressed mood: Secondary | ICD-10-CM | POA: Diagnosis not present

## 2017-11-20 DIAGNOSIS — Z9989 Dependence on other enabling machines and devices: Secondary | ICD-10-CM

## 2017-11-20 DIAGNOSIS — R4586 Emotional lability: Secondary | ICD-10-CM | POA: Insufficient documentation

## 2017-11-20 MED ORDER — SERTRALINE HCL 100 MG PO TABS: 100.0000 mg | ORAL_TABLET | Freq: Every day | ORAL | 3 refills | Status: DC

## 2017-11-20 NOTE — Assessment & Plan Note (Signed)
Reassurance given that she does not sound to have bipolar disorder as that was her main concern today. I suspect some underlying anxiety mixed with significant sleep deprivation due to untreated OSA currently. We talked about how most of her symptoms are more related to sleep deprivation and how important it is to get a new CPAP that works and using it every single night. She may have symptoms for the first couple of weeks of CPAP due to sleep debt.

## 2017-11-20 NOTE — Assessment & Plan Note (Signed)
Will change lexapro with zoloft. She is advised that some of her mood disturbance is likely related to sleep deprivation due to OSA and not using CPAP.

## 2017-11-20 NOTE — Progress Notes (Signed)
   Subjective:    Patient ID: Stephanie Booker, female    DOB: 1960/05/12, 57 y.o.   MRN: [id number]  HPI The patient is a 57 YO female coming in for concerns about OSA (she has CPAP but hers broke a long time ago, she contacted the company and they told her to just continue using it broken, she is not wearing it any longer as she felt it stopped working then, she is sleeping about 11 hours at night, up 3-4 times she can recall, waking up exhausted all the time, denies napping during the day), irritability (snapping at people at home and work, is worried about this, it is not like her, she is sleeping a lot lately and still waking tired, denies SI/HI, is concerned about bipolar as her mood can snap quickly. ) and stress (she is taking lexapro but does not feel like it is working, she is snapping at almost everyone lately, things are bothering her which normally do not, she denies SI/HI, denies feeling guilty).   Review of Systems  Constitutional: Positive for activity change. Negative for appetite change, diaphoresis, fatigue and unexpected weight change.  HENT: Negative.   Eyes: Negative.   Respiratory: Negative for cough, chest tightness and shortness of breath.   Cardiovascular: Negative for chest pain, palpitations and leg swelling.  Gastrointestinal: Negative for abdominal distention, abdominal pain, constipation, diarrhea, nausea and vomiting.  Musculoskeletal: Negative.   Skin: Negative.   Neurological: Negative.   Psychiatric/Behavioral: Positive for behavioral problems, decreased concentration, dysphoric mood and sleep disturbance. Negative for agitation, confusion, hallucinations, self-injury and suicidal ideas. The patient is nervous/anxious. The patient is not hyperactive.       Objective:   Physical Exam  Constitutional: She is oriented to person, place, and time. She appears well-developed and well-nourished.  HENT:  Head: Normocephalic and atraumatic.  Eyes: EOM are normal.    Neck: Normal range of motion.  Cardiovascular: Normal rate and regular rhythm.  Pulmonary/Chest: Effort normal and breath sounds normal. No respiratory distress. She has no wheezes. She has no rales.  Abdominal: Soft. Bowel sounds are normal. She exhibits no distension. There is no tenderness. There is no rebound.  Musculoskeletal: She exhibits no edema.  Neurological: She is alert and oriented to person, place, and time. Coordination normal.  Skin: Skin is warm and dry.  Psychiatric:  Mood normal but some distraction during visit   Vitals:   11/20/17 0906  BP: 110/60  Pulse: 61  Temp: (!) 97.5 F (36.4 C)  TempSrc: Oral  SpO2: 95%  Weight: 144 lb (65.3 kg)  Height: 4' 10.5" (1.486 m)      Assessment & Plan:  Flu shot given at visit

## 2017-11-20 NOTE — Patient Instructions (Signed)
We will get the cpap replaced to see if sleeping better will help the mood. I think that sleep deprivation is causing a big portion of your irritable mood.   We have sent in sertraline to replace the lexapro. This will still be 1 pill daily. Do not take lexapro and sertraline on the same day but it is okay to take lexapro today and then sertraline tomorrow.

## 2017-11-20 NOTE — Assessment & Plan Note (Signed)
Ordered a new CPAP machine to replace her broken one. She is having signs of clinical sleep deprivation including poor and labile mood, problems concentrating. She is willing to start using this again when she gets a working unit.

## 2017-11-22 ENCOUNTER — Other Ambulatory Visit: Payer: Self-pay | Admitting: Internal Medicine

## 2017-11-22 ENCOUNTER — Telehealth: Payer: Self-pay | Admitting: Internal Medicine

## 2017-11-22 NOTE — Telephone Encounter (Signed)
Copied from CRM [id number]. Topic: General - Other >> Nov 22, 2017 12:48 PM Gaynelle Adu wrote: Reason for CRM: [facility]   [facility] is calling to request a copy of the patients sleep study. They stated the documentation can be faxed to [phone] or placed in epic.  The best contact number: [phone]  Please advise

## 2017-11-22 NOTE — Telephone Encounter (Signed)
Called Stephanie Booker and stated I did not see anything in chart about a sleep study, he called stating she had it in [place] and it was in 2010, to move forward with getting a new CPAP, they need the patient to complete a new sleep study, please advise on orders.

## 2017-11-26 NOTE — Telephone Encounter (Signed)
I asked Almyra Free in pulmonary and she thinks they will cover either, but really won't know for sure till we do the authorization.

## 2017-11-26 NOTE — Telephone Encounter (Signed)
LVM for patient with MD response and to call back and let us know if she knows of somewhere by her to get the sleep study done to let us know and we can try to get the order there.

## 2017-11-26 NOTE — Telephone Encounter (Signed)
Call patient and see if she is willing to do another sleep study to get new CPAP.

## 2017-11-26 NOTE — Telephone Encounter (Signed)
Do we know if insurance will cover in lab sleep study or at home study? I do not know locations in [place] to do this but if she can find a location we can try to get order there. Otherwise a home sleep test is a good option but she would have to pick up equipment and then return it.

## 2017-11-26 NOTE — Telephone Encounter (Signed)
Patient willing to get sleep study was hoping it could be done closer to where she lives

## 2017-12-03 ENCOUNTER — Other Ambulatory Visit: Payer: Self-pay | Admitting: Internal Medicine

## 2017-12-24 ENCOUNTER — Other Ambulatory Visit: Payer: Self-pay | Admitting: Internal Medicine

## 2017-12-30 ENCOUNTER — Other Ambulatory Visit: Payer: Self-pay | Admitting: Internal Medicine

## 2018-01-20 ENCOUNTER — Other Ambulatory Visit: Payer: Self-pay | Admitting: Internal Medicine

## 2018-02-12 ENCOUNTER — Ambulatory Visit: Payer: BLUE CROSS/BLUE SHIELD | Admitting: Family

## 2018-02-15 ENCOUNTER — Other Ambulatory Visit: Payer: Self-pay | Admitting: Internal Medicine

## 2018-02-21 ENCOUNTER — Other Ambulatory Visit: Payer: Self-pay | Admitting: Internal Medicine

## 2018-02-21 DIAGNOSIS — F4323 Adjustment disorder with mixed anxiety and depressed mood: Secondary | ICD-10-CM

## 2018-02-26 ENCOUNTER — Encounter: Payer: Self-pay | Admitting: Internal Medicine

## 2018-02-26 ENCOUNTER — Ambulatory Visit: Payer: BLUE CROSS/BLUE SHIELD | Admitting: Internal Medicine

## 2018-02-26 VITALS — BP 110/80 | HR 72 | Temp 97.9°F | Ht 58.5 in | Wt 146.0 lb

## 2018-02-26 DIAGNOSIS — J392 Other diseases of pharynx: Secondary | ICD-10-CM | POA: Diagnosis not present

## 2018-02-26 DIAGNOSIS — F17200 Nicotine dependence, unspecified, uncomplicated: Secondary | ICD-10-CM

## 2018-02-26 DIAGNOSIS — Z720 Tobacco use: Secondary | ICD-10-CM

## 2018-02-26 MED ORDER — OMEPRAZOLE 20 MG PO CPDR: 20.0000 mg | DELAYED_RELEASE_CAPSULE | Freq: Every day | ORAL | 3 refills | Status: DC

## 2018-02-26 MED ORDER — ESCITALOPRAM OXALATE 20 MG PO TABS: 20.0000 mg | ORAL_TABLET | Freq: Every day | ORAL | 3 refills | Status: DC

## 2018-02-26 NOTE — Progress Notes (Signed)
   Subjective:   Patient ID: Stephanie Booker, female    DOB: 10/23/1960, 58 y.o.   MRN: [id number]  HPI The patient is a 58 y.o. female coming in for cold symptoms. Started a couple of weeks ago. Main symptoms are: tickle in her throat and then she will have a coughing spell. She is very concerned as she is a smoker. Her lungs feel okay and denies sputum production. Denies SOB or change in activity. She is having some nose drainage. She is also having some worsening GERD symptoms lately with burning at the top of her stomach. Denies fevers or chills. Denies headaches or body aches. Overall it is stable. Has tried nothing for it.   Review of Systems  Constitutional: Positive for activity change and appetite change. Negative for chills, fatigue, fever and unexpected weight change.  HENT: Positive for congestion, postnasal drip, rhinorrhea and sinus pressure. Negative for ear discharge, ear pain, sinus pain, sneezing, sore throat, tinnitus, trouble swallowing and voice change.   Eyes: Negative.   Respiratory: Positive for cough. Negative for chest tightness, shortness of breath and wheezing.   Cardiovascular: Negative.   Gastrointestinal: Negative.   Musculoskeletal: Negative.   Neurological: Negative.     Objective:  Physical Exam Constitutional:      Appearance: She is well-developed.  HENT:     Head: Normocephalic and atraumatic.     Comments: Oropharynx with redness and clear drainage, nose with swollen turbinates, TMs normal bilaterally.  Neck:     Musculoskeletal: Normal range of motion.     Thyroid: No thyromegaly.  Cardiovascular:     Rate and Rhythm: Normal rate and regular rhythm.  Pulmonary:     Effort: Pulmonary effort is normal. No respiratory distress.     Breath sounds: Normal breath sounds. No wheezing or rales.  Abdominal:     Palpations: Abdomen is soft.  Lymphadenopathy:     Cervical: No cervical adenopathy.  Skin:    General: Skin is warm and dry.  Neurological:      Mental Status: She is alert and oriented to person, place, and time.     Vitals:   02/26/18 1045  BP: 110/80  Pulse: 72  Temp: 97.9 F (36.6 C)  TempSrc: Oral  SpO2: 99%  Weight: 146 lb (66.2 kg)  Height: 4' 10.5" (1.486 m)    Assessment & Plan:  Visit time 25 minutes: greater than 50% of that time was spent in face to face counseling and coordination of care with the patient: counseled about risk of cancer due to smoking, risk of screening with false positive or false negative with low dose CT and counseled to stop smoking

## 2018-02-26 NOTE — Patient Instructions (Addendum)
We have refilled the lexapro.   We would like you to take zyrtec over the counter 1 pill daily to see if this helps with the throat.   We have sent in omeprazole to take for 2-3 weeks for the stomach to see if this can stop this.  We will get you in for the lung cancer screening test.

## 2018-02-27 ENCOUNTER — Telehealth: Payer: Self-pay | Admitting: Acute Care

## 2018-02-27 DIAGNOSIS — J392 Other diseases of pharynx: Secondary | ICD-10-CM | POA: Insufficient documentation

## 2018-02-27 NOTE — Telephone Encounter (Signed)
Spoke with pt regarding lung cancer screening. PT given [facility] and [facility] so pt can check coverage with [employer] .  Will await call back from pt. Will close this message and refer to referral notes.

## 2018-02-27 NOTE — Assessment & Plan Note (Signed)
Could be related to post nasal drip or GERD. Rx for omeprazole as well as advised to start zyrtec. If still persistent after adequate treatment will refer to ENT for visualization.

## 2018-02-27 NOTE — Assessment & Plan Note (Signed)
Time spent counseling about tobacco usage: 4 minutes. I have asked about smoking and is smoking same as usual. The patient is advised to quit. The patient is not willing to quit. They would like to try to quit in the next 12 months. We will follow up with them in 12 months.   

## 2018-03-07 ENCOUNTER — Other Ambulatory Visit: Payer: Self-pay | Admitting: Internal Medicine

## 2018-03-10 ENCOUNTER — Other Ambulatory Visit: Payer: Self-pay | Admitting: Acute Care

## 2018-03-10 DIAGNOSIS — Z122 Encounter for screening for malignant neoplasm of respiratory organs: Secondary | ICD-10-CM

## 2018-03-10 DIAGNOSIS — Z87891 Personal history of nicotine dependence: Secondary | ICD-10-CM

## 2018-04-01 ENCOUNTER — Other Ambulatory Visit: Payer: Self-pay | Admitting: Internal Medicine

## 2018-04-09 ENCOUNTER — Ambulatory Visit: Payer: BLUE CROSS/BLUE SHIELD

## 2018-04-09 ENCOUNTER — Encounter: Payer: BLUE CROSS/BLUE SHIELD | Admitting: Acute Care

## 2018-04-10 ENCOUNTER — Ambulatory Visit: Payer: BLUE CROSS/BLUE SHIELD

## 2018-04-23 ENCOUNTER — Encounter: Payer: BLUE CROSS/BLUE SHIELD | Admitting: Acute Care

## 2018-04-23 ENCOUNTER — Ambulatory Visit: Payer: BLUE CROSS/BLUE SHIELD

## 2018-05-07 ENCOUNTER — Other Ambulatory Visit: Payer: Self-pay | Admitting: Internal Medicine

## 2018-05-24 ENCOUNTER — Other Ambulatory Visit: Payer: Self-pay | Admitting: Internal Medicine

## 2018-05-30 ENCOUNTER — Other Ambulatory Visit: Payer: Self-pay | Admitting: Internal Medicine

## 2018-06-26 ENCOUNTER — Other Ambulatory Visit: Payer: Self-pay | Admitting: Internal Medicine

## 2018-07-10 ENCOUNTER — Other Ambulatory Visit: Payer: Self-pay | Admitting: Internal Medicine

## 2018-08-01 ENCOUNTER — Other Ambulatory Visit: Payer: Self-pay | Admitting: Internal Medicine

## 2018-08-12 ENCOUNTER — Other Ambulatory Visit: Payer: Self-pay | Admitting: Internal Medicine

## 2018-08-18 ENCOUNTER — Encounter: Payer: Self-pay | Admitting: Internal Medicine

## 2018-08-18 ENCOUNTER — Ambulatory Visit (INDEPENDENT_AMBULATORY_CARE_PROVIDER_SITE_OTHER): Payer: BC Managed Care – PPO | Admitting: Internal Medicine

## 2018-08-18 DIAGNOSIS — M2550 Pain in unspecified joint: Secondary | ICD-10-CM

## 2018-08-18 NOTE — Progress Notes (Signed)
Virtual Visit via Audio Note  I connected with Stephanie Booker on 08/18/18 at  3:00 PM EDT by an audio-only enabled telemedicine application and verified that I am speaking with the correct person using two identifiers.  The patient and the provider were at separate locations throughout the entire encounter.   I discussed the limitations of evaluation and management by telemedicine and the availability of in person appointments. The patient expressed understanding and agreed to proceed.  History of Present Illness: The patient is a 58 y.o. female with visit for hurting in her bones. She saw somewhere and got covid-19 testing done about 1 week ago and it was negative. Started about a week ago. Denies fevers or cihlls. Has pain all over her body and it feels deep inside. They did not do labs at the urgent care. She denies new cough (smoker has chronic cough) or SOB. No change in sinus drainage. No sinus pain or pressure. Denies recent weight gain or loss. Denies night sweats. Denies recent injury or travel. Overall it is stable. Has tried tylenol without relief.  Observations/Objective: Voice strong, no dyspnea, no coughing during visit, A and O times 3  Assessment and Plan: See problem oriented charting  Follow Up Instructions: labs to evaluate, depending on results may need in person visit  Visit time 13 minutes: that time was spent in face to face counseling and coordination of care with the patient: counseled about as above  I discussed the assessment and treatment plan with the patient. The patient was provided an opportunity to ask questions and all were answered. The patient agreed with the plan and demonstrated an understanding of the instructions.   The patient was advised to call back or seek an in-person evaluation if the symptoms worsen or if the condition fails to improve as anticipated.  Hoyt Koch, MD

## 2018-08-19 DIAGNOSIS — M2550 Pain in unspecified joint: Secondary | ICD-10-CM | POA: Insufficient documentation

## 2018-08-19 NOTE — Assessment & Plan Note (Signed)
Checking for autoimmune cause of the joint pain. It is difficult to assess this over the phone without exam. If no results in the labs she may need in person visit. She is asked to stop lipitor and fenofibrate for 2 weeks to see if this helps. She also asks for refills for all meds during visit but no labs recently so asked her to get labs and if okay we can send in refills until her physical can be performed.

## 2018-08-21 ENCOUNTER — Other Ambulatory Visit: Payer: Self-pay | Admitting: Internal Medicine

## 2018-08-29 ENCOUNTER — Other Ambulatory Visit: Payer: Self-pay | Admitting: Internal Medicine

## 2018-09-01 ENCOUNTER — Other Ambulatory Visit: Payer: Self-pay

## 2018-09-01 ENCOUNTER — Inpatient Hospital Stay (HOSPITAL_COMMUNITY)
Admission: EM | Admit: 2018-09-01 | Discharge: 2018-09-04 | DRG: 442 | Disposition: A | Discharge status: Home / Self Care | Payer: BC Managed Care – PPO | Attending: Internal Medicine | Admitting: Internal Medicine

## 2018-09-01 ENCOUNTER — Encounter (HOSPITAL_COMMUNITY): Payer: Self-pay | Admitting: Emergency Medicine

## 2018-09-01 DIAGNOSIS — R0600 Dyspnea, unspecified: Secondary | ICD-10-CM

## 2018-09-01 DIAGNOSIS — I10 Essential (primary) hypertension: Secondary | ICD-10-CM | POA: Diagnosis present

## 2018-09-01 DIAGNOSIS — E872 Acidosis, unspecified: Secondary | ICD-10-CM

## 2018-09-01 DIAGNOSIS — E663 Overweight: Secondary | ICD-10-CM | POA: Diagnosis present

## 2018-09-01 DIAGNOSIS — B169 Acute hepatitis B without delta-agent and without hepatic coma: Secondary | ICD-10-CM

## 2018-09-01 DIAGNOSIS — D649 Anemia, unspecified: Secondary | ICD-10-CM | POA: Diagnosis present

## 2018-09-01 DIAGNOSIS — R945 Abnormal results of liver function studies: Secondary | ICD-10-CM | POA: Diagnosis not present

## 2018-09-01 DIAGNOSIS — R7401 Elevation of levels of liver transaminase levels: Secondary | ICD-10-CM

## 2018-09-01 DIAGNOSIS — Z7982 Long term (current) use of aspirin: Secondary | ICD-10-CM

## 2018-09-01 DIAGNOSIS — G4733 Obstructive sleep apnea (adult) (pediatric): Secondary | ICD-10-CM

## 2018-09-01 DIAGNOSIS — B179 Acute viral hepatitis, unspecified: Secondary | ICD-10-CM | POA: Diagnosis present

## 2018-09-01 DIAGNOSIS — Z20828 Contact with and (suspected) exposure to other viral communicable diseases: Secondary | ICD-10-CM | POA: Diagnosis present

## 2018-09-01 DIAGNOSIS — G932 Benign intracranial hypertension: Secondary | ICD-10-CM | POA: Diagnosis present

## 2018-09-01 DIAGNOSIS — R0602 Shortness of breath: Secondary | ICD-10-CM

## 2018-09-01 DIAGNOSIS — Z79899 Other long term (current) drug therapy: Secondary | ICD-10-CM

## 2018-09-01 DIAGNOSIS — E785 Hyperlipidemia, unspecified: Secondary | ICD-10-CM | POA: Diagnosis present

## 2018-09-01 DIAGNOSIS — Z811 Family history of alcohol abuse and dependence: Secondary | ICD-10-CM

## 2018-09-01 DIAGNOSIS — R17 Unspecified jaundice: Secondary | ICD-10-CM | POA: Diagnosis present

## 2018-09-01 DIAGNOSIS — F419 Anxiety disorder, unspecified: Secondary | ICD-10-CM | POA: Diagnosis present

## 2018-09-01 DIAGNOSIS — F329 Major depressive disorder, single episode, unspecified: Secondary | ICD-10-CM | POA: Diagnosis present

## 2018-09-01 DIAGNOSIS — Z8673 Personal history of transient ischemic attack (TIA), and cerebral infarction without residual deficits: Secondary | ICD-10-CM

## 2018-09-01 DIAGNOSIS — G8929 Other chronic pain: Secondary | ICD-10-CM | POA: Diagnosis present

## 2018-09-01 DIAGNOSIS — Z803 Family history of malignant neoplasm of breast: Secondary | ICD-10-CM

## 2018-09-01 DIAGNOSIS — R7989 Other specified abnormal findings of blood chemistry: Secondary | ICD-10-CM | POA: Diagnosis present

## 2018-09-01 DIAGNOSIS — F17200 Nicotine dependence, unspecified, uncomplicated: Secondary | ICD-10-CM | POA: Diagnosis present

## 2018-09-01 DIAGNOSIS — E876 Hypokalemia: Secondary | ICD-10-CM

## 2018-09-01 DIAGNOSIS — Z90710 Acquired absence of both cervix and uterus: Secondary | ICD-10-CM

## 2018-09-01 LAB — URINALYSIS, ROUTINE W REFLEX MICROSCOPIC
Bacteria, UA: NONE SEEN
Glucose, UA: NEGATIVE mg/dL
Ketones, ur: NEGATIVE mg/dL
Leukocytes,Ua: NEGATIVE
Nitrite: NEGATIVE
Protein, ur: NEGATIVE mg/dL
Specific Gravity, Urine: 1.01 (ref 1.005–1.030)
pH: 6 (ref 5.0–8.0)

## 2018-09-01 LAB — COMPREHENSIVE METABOLIC PANEL
ALT: 1183 U/L — ABNORMAL HIGH (ref 0–44)
AST: 1004 U/L — ABNORMAL HIGH (ref 15–41)
Albumin: 2.7 g/dL — ABNORMAL LOW (ref 3.5–5.0)
Alkaline Phosphatase: 443 U/L — ABNORMAL HIGH (ref 38–126)
Anion gap: 9 (ref 5–15)
BUN: 14 mg/dL (ref 6–20)
CO2: 16 mmol/L — ABNORMAL LOW (ref 22–32)
Calcium: 8.3 mg/dL — ABNORMAL LOW (ref 8.9–10.3)
Chloride: 110 mmol/L (ref 98–111)
Creatinine, Ser: 0.57 mg/dL (ref 0.44–1.00)
GFR calc Af Amer: >60 mL/min (ref >60)
GFR calc non Af Amer: >60 mL/min (ref >60)
Glucose, Bld: 89 mg/dL (ref 70–99)
Potassium: 3.3 mmol/L — ABNORMAL LOW (ref 3.5–5.1)
Sodium: 135 mmol/L (ref 135–145)
Total Bilirubin: 10.2 mg/dL — ABNORMAL HIGH (ref 0.3–1.2)
Total Protein: 6 g/dL — ABNORMAL LOW (ref 6.5–8.1)

## 2018-09-01 LAB — CBC
HCT: 40.6 % (ref 36.0–46.0)
Hemoglobin: 13 g/dL (ref 12.0–15.0)
MCH: 30.7 pg (ref 26.0–34.0)
MCHC: 32 g/dL (ref 30.0–36.0)
MCV: 96 fL (ref 80.0–100.0)
Platelets: 217 10*3/uL (ref 150–400)
RBC: 4.23 MIL/uL (ref 3.87–5.11)
RDW: 15.7 % — ABNORMAL HIGH (ref 11.5–15.5)
WBC: 5.9 10*3/uL (ref 4.0–10.5)
nRBC: 0 % (ref 0.0–0.2)

## 2018-09-01 LAB — LIPASE, BLOOD: Lipase: 67 U/L — ABNORMAL HIGH (ref 11–51)

## 2018-09-01 MED ORDER — SODIUM CHLORIDE 0.9% FLUSH
3.0000 mL | Freq: Once | INTRAVENOUS | Status: AC
  Administered 2018-09-02: 3 mL via INTRAVENOUS

## 2018-09-01 NOTE — ED Triage Notes (Signed)
Pt reports feeling "full" x 1 week with generalized abdominal pain and orange urine.

## 2018-09-02 ENCOUNTER — Other Ambulatory Visit: Payer: Self-pay

## 2018-09-02 ENCOUNTER — Emergency Department (HOSPITAL_COMMUNITY): Payer: BC Managed Care – PPO

## 2018-09-02 ENCOUNTER — Inpatient Hospital Stay (HOSPITAL_COMMUNITY): Payer: BC Managed Care – PPO

## 2018-09-02 DIAGNOSIS — E872 Acidosis, unspecified: Secondary | ICD-10-CM

## 2018-09-02 DIAGNOSIS — F329 Major depressive disorder, single episode, unspecified: Secondary | ICD-10-CM | POA: Diagnosis present

## 2018-09-02 DIAGNOSIS — E663 Overweight: Secondary | ICD-10-CM | POA: Diagnosis present

## 2018-09-02 DIAGNOSIS — F17200 Nicotine dependence, unspecified, uncomplicated: Secondary | ICD-10-CM | POA: Diagnosis present

## 2018-09-02 DIAGNOSIS — R0600 Dyspnea, unspecified: Secondary | ICD-10-CM

## 2018-09-02 DIAGNOSIS — F419 Anxiety disorder, unspecified: Secondary | ICD-10-CM | POA: Diagnosis present

## 2018-09-02 DIAGNOSIS — I10 Essential (primary) hypertension: Secondary | ICD-10-CM | POA: Diagnosis present

## 2018-09-02 DIAGNOSIS — B169 Acute hepatitis B without delta-agent and without hepatic coma: Secondary | ICD-10-CM | POA: Diagnosis present

## 2018-09-02 DIAGNOSIS — R945 Abnormal results of liver function studies: Secondary | ICD-10-CM | POA: Diagnosis present

## 2018-09-02 DIAGNOSIS — R7989 Other specified abnormal findings of blood chemistry: Secondary | ICD-10-CM | POA: Diagnosis present

## 2018-09-02 DIAGNOSIS — E876 Hypokalemia: Secondary | ICD-10-CM | POA: Diagnosis present

## 2018-09-02 DIAGNOSIS — B179 Acute viral hepatitis, unspecified: Secondary | ICD-10-CM | POA: Diagnosis present

## 2018-09-02 DIAGNOSIS — Z20828 Contact with and (suspected) exposure to other viral communicable diseases: Secondary | ICD-10-CM | POA: Diagnosis present

## 2018-09-02 DIAGNOSIS — G8929 Other chronic pain: Secondary | ICD-10-CM | POA: Diagnosis present

## 2018-09-02 DIAGNOSIS — Z8673 Personal history of transient ischemic attack (TIA), and cerebral infarction without residual deficits: Secondary | ICD-10-CM | POA: Diagnosis not present

## 2018-09-02 DIAGNOSIS — Z79899 Other long term (current) drug therapy: Secondary | ICD-10-CM | POA: Diagnosis not present

## 2018-09-02 DIAGNOSIS — Z7982 Long term (current) use of aspirin: Secondary | ICD-10-CM | POA: Diagnosis not present

## 2018-09-02 DIAGNOSIS — Z90710 Acquired absence of both cervix and uterus: Secondary | ICD-10-CM | POA: Diagnosis not present

## 2018-09-02 DIAGNOSIS — E785 Hyperlipidemia, unspecified: Secondary | ICD-10-CM | POA: Diagnosis present

## 2018-09-02 DIAGNOSIS — D649 Anemia, unspecified: Secondary | ICD-10-CM | POA: Diagnosis present

## 2018-09-02 DIAGNOSIS — G932 Benign intracranial hypertension: Secondary | ICD-10-CM | POA: Diagnosis present

## 2018-09-02 DIAGNOSIS — G4733 Obstructive sleep apnea (adult) (pediatric): Secondary | ICD-10-CM | POA: Diagnosis present

## 2018-09-02 DIAGNOSIS — R17 Unspecified jaundice: Secondary | ICD-10-CM | POA: Diagnosis present

## 2018-09-02 DIAGNOSIS — Z803 Family history of malignant neoplasm of breast: Secondary | ICD-10-CM | POA: Diagnosis not present

## 2018-09-02 DIAGNOSIS — Z811 Family history of alcohol abuse and dependence: Secondary | ICD-10-CM | POA: Diagnosis not present

## 2018-09-02 LAB — ETHANOL: Alcohol, Ethyl (B): <10 mg/dL (ref <10)

## 2018-09-02 LAB — HIV ANTIBODY (ROUTINE TESTING W REFLEX): HIV Screen 4th Generation wRfx: NONREACTIVE

## 2018-09-02 LAB — SARS CORONAVIRUS 2 (TAT 6-24 HRS): SARS Coronavirus 2: NEGATIVE

## 2018-09-02 LAB — PROTIME-INR
INR: 1.2 (ref 0.8–1.2)
Prothrombin Time: 14.8 seconds (ref 11.4–15.2)

## 2018-09-02 LAB — ACETAMINOPHEN LEVEL: Acetaminophen (Tylenol), Serum: <10 ug/mL — ABNORMAL LOW (ref 10–30)

## 2018-09-02 LAB — AMMONIA: Ammonia: 85 umol/L — ABNORMAL HIGH (ref 9–35)

## 2018-09-02 MED ORDER — FENTANYL CITRATE (PF) 100 MCG/2ML IJ SOLN
50.0000 ug | Freq: Once | INTRAMUSCULAR | Status: AC
  Administered 2018-09-02: 50 ug via INTRAVENOUS
  Filled 2018-09-02: qty 2

## 2018-09-02 MED ORDER — SODIUM CHLORIDE 0.9 % IV BOLUS
1000.0000 mL | Freq: Once | INTRAVENOUS | Status: AC
  Administered 2018-09-02: 1000 mL via INTRAVENOUS

## 2018-09-02 MED ORDER — SODIUM CHLORIDE 0.9 % IV SOLN
INTRAVENOUS | Status: AC
  Administered 2018-09-02: 08:00:00 via INTRAVENOUS

## 2018-09-02 MED ORDER — POTASSIUM CHLORIDE CRYS ER 20 MEQ PO TBCR
40.0000 meq | EXTENDED_RELEASE_TABLET | Freq: Once | ORAL | Status: AC
  Administered 2018-09-02: 40 meq via ORAL
  Filled 2018-09-02: qty 2

## 2018-09-02 MED ORDER — HYDROMORPHONE HCL 1 MG/ML IJ SOLN
0.5000 mg | INTRAMUSCULAR | Status: DC | PRN
  Administered 2018-09-02 – 2018-09-03 (×3): 0.5 mg via INTRAVENOUS
  Filled 2018-09-02 (×3): qty 0.5

## 2018-09-02 MED ORDER — ESCITALOPRAM OXALATE 20 MG PO TABS
20.0000 mg | ORAL_TABLET | Freq: Every day | ORAL | Status: DC
  Administered 2018-09-02 – 2018-09-04 (×3): 20 mg via ORAL
  Filled 2018-09-02: qty 2
  Filled 2018-09-02 (×2): qty 1

## 2018-09-02 NOTE — ED Notes (Signed)
Patient transported to X-ray 

## 2018-09-02 NOTE — ED Provider Notes (Signed)
[facility] EM[facility]GENCY [facility] Provider Note   CSN: [id number] Arrival date & time: 09/01/18  1920     History   Chief Complaint Chief Complaint  Patient presents with  . Abdominal Pain    HPI Stephanie Booker is a 58 y.o. female.     Patient presents to the emergency department with a chief complaint of generalized abdominal pain.  She reports bloating, orange-colored urine, fatigue.  She states the symptoms started about a week ago and have been gradually worsening.  She denies any fevers or chills.  She denies any significant alcohol use.  She reports having taken Tylenol for the pain.  Abdominal surgical history notable for hysterectomy.  The history is provided by the patient. No language interpreter was used.    Past Medical History:  Diagnosis Date  . Anxiety   . Hyperlipidemia   . OSA (obstructive sleep apnea)   . 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 Arthralgia 08/19/2018  . Throat irritation 02/27/2018  . OSA on CPAP 11/20/2017  . Labile mood 11/20/2017  . Chronic meniscal tear of knee 03/14/2015  . Routine general medical examination at a health care facility 03/03/2015  . Adjustment disorder with mixed anxiety and depressed mood 03/03/2015  . Overweight (BMI 25.0-29.9) 06/08/2014  . Tobacco use disorder 02/03/2014  . Hyperlipidemia 12/07/2013  . Essential hypertension 12/07/2013  . Pseudotumor cerebri 10/15/2013  . 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 ABDOMINAL HYSTERECTOMY       OB History   No obstetric history on file.      Home Medications    Prior to Admission medications   Medication Sig Start Date End Date Taking? Authorizing Provider  acetaZOLAMIDE (DIAMOX) 250 MG tablet TAKE 1 TABLET BY MOUTH 2 (TWO) TIMES DAILY. NEED ANNUAL APPOINTMENT FOR FURTHER REFILLS 08/01/18   Myrlene Brokerrawford, Elizabeth A, MD   aspirin EC 325 MG tablet Take 1 tablet (325 mg total) by mouth daily. 07/15/13   Esperanza SheetsBuriev, Ulugbek N, MD  atorvastatin (LIPITOR) 20 MG tablet Take 1 tablet (20 mg total) by mouth daily at 6 PM. Need appointment for further refills 08/21/18   Myrlene Brokerrawford, Elizabeth A, MD  escitalopram (LEXAPRO) 20 MG tablet Take 1 tablet (20 mg total) by mouth daily. 02/26/18   Myrlene Brokerrawford, Elizabeth A, MD  fenofibrate 160 MG tablet TAKE 1 TABLET (160 MG TOTAL) BY MOUTH DAILY. NEED ANNUAL APPOINTMENT FOR FURTHER REFILLS 07/10/18   Myrlene Brokerrawford, Elizabeth A, MD  KLOR-CON M10 10 MEQ tablet TAKE 1 TABLET BY MOUTH EVERY DAY *NEEDS APPT* 08/29/18   Myrlene Brokerrawford, Elizabeth A, MD  valACYclovir (VALTREX) 1000 MG tablet Take 2 tablets (2,000 mg total) by mouth 2 (two) times daily. 02/13/17   Myrlene Brokerrawford, Elizabeth A, MD    Family History Family History  Problem Relation Age of Onset  . Breast cancer Mother     Social History Social History   Tobacco Use  . Smoking status: Current Every Day Smoker    Packs/day: 1.00    Start date: 01/23/1975  . Smokeless tobacco: Never Used  Substance Use Topics  . Alcohol use: Yes    Alcohol/week: 0.0 standard drinks    Comment: occassionally  . Drug use: No     Allergies   Patient has no known allergies.   Review of Systems Review of Systems  All other systems  reviewed and are negative.    Physical Exam Updated Vital Signs BP 116/81   Pulse 80   Temp 98 F (36.7 C) (Oral)   Resp 18   Ht 4' 10.5" (1.486 m)   Wt 62.6 kg   SpO2 96%   BMI 28.35 kg/m   Physical Exam Vitals signs and nursing note reviewed.  Constitutional:      General: She is not in acute distress.    Appearance: She is well-developed.  HENT:     Head: Normocephalic and atraumatic.  Eyes:     General: Scleral icterus present.     Conjunctiva/sclera: Conjunctivae normal.  Neck:     Musculoskeletal: Neck supple.  Cardiovascular:     Rate and Rhythm: Normal rate and regular rhythm.     Heart sounds: No murmur.   Pulmonary:     Effort: Pulmonary effort is normal. No respiratory distress.     Breath sounds: Normal breath sounds.  Abdominal:     General: There is distension.     Palpations: Abdomen is soft.     Tenderness: There is abdominal tenderness in the right upper quadrant.  Skin:    General: Skin is warm and dry.     Coloration: Skin is jaundiced.  Neurological:     Mental Status: She is alert and oriented to person, place, and time.  Psychiatric:        Mood and Affect: Mood normal.        Behavior: Behavior normal.      ED Treatments / Results  Labs (all labs ordered are listed, but only abnormal results are displayed) Labs Reviewed  LIPASE, BLOOD - Abnormal; Notable for the following components:      Result Value   Lipase 67 (*)    All other components within normal limits  COMPREHENSIVE METABOLIC PANEL - Abnormal; Notable for the following components:   Potassium 3.3 (*)    CO2 16 (*)    Calcium 8.3 (*)    Total Protein 6.0 (*)    Albumin 2.7 (*)    AST 1,004 (*)    ALT 1,183 (*)    Alkaline Phosphatase 443 (*)    Total Bilirubin 10.2 (*)    All other components within normal limits  CBC - Abnormal; Notable for the following components:   RDW 15.7 (*)    All other components within normal limits  URINALYSIS, ROUTINE W REFLEX MICROSCOPIC - Abnormal; Notable for the following components:   Color, Urine AMBER (*)    Hgb urine dipstick MODERATE (*)    Bilirubin Urine SMALL (*)    All other components within normal limits  AMMONIA  ACETAMINOPHEN LEVEL  HEPATITIS PANEL, ACUTE    EKG None  Radiology US Abdomen Limited  Result Date: 09/02/2018 CLINICAL DATA:  Right upper quadrant pain EXAM: ULTRASOUND ABDOMEN LIMITED RIGHT UPPER QUADRANT COMPARISON:  None. FINDINGS: Gallbladder: Minimally distended gallbladder which accounts for smooth wall thickening that is accentuated by pericholecystic fat. Negative for stone or focal tenderness. No pericholecystic edema. Common  bile duct: Diameter: 3 mm Liver: No focal lesion identified. Within normal limits in parenchymal echogenicity. Portal vein is patent on color Doppler imaging with normal direction of blood flow towards the liver. IMPRESSION: 1. No acute finding. 2. Under distended gallbladder with no evidence of calculus. Electronically Signed   By: Monte Fantasia M.D.   On: 09/02/2018 04:06    Procedures Procedures (including critical care time) CRITICAL CARE Performed by: Montine Circle T.bili 10.2,  ammonia 85  Total critical care time: 35 minutes  Critical care time was exclusive of separately billable procedures and treating other patients.  Critical care was necessary to treat or prevent imminent or life-threatening deterioration.  Critical care was time spent personally by me on the following activities: development of treatment plan with patient and/or surrogate as well as nursing, discussions with consultants, evaluation of patient's response to treatment, examination of patient, obtaining history from patient or surrogate, ordering and performing treatments and interventions, ordering and review of laboratory studies, ordering and review of radiographic studies, pulse oximetry and re-evaluation of patient's condition.  Medications Ordered in ED Medications  sodium chloride flush (NS) 0.9 % injection 3 mL (has no administration in time range)  fentaNYL (SUBLIMAZE) injection 50 mcg (50 mcg Intravenous Given 09/02/18 0341)  sodium chloride 0.9 % bolus 1,000 mL (1,000 mLs Intravenous New Bag/Given 09/02/18 0340)     Initial Impression / Assessment and Plan / ED Course  I have reviewed the triage vital signs and the nursing notes.  Pertinent labs & imaging results that were available during my care of the patient were reviewed by me and considered in my medical decision making (see chart for details).       Patient with generalized fatigue, abdominal discomfort, x1 week.  Laboratory work-up  ordered in triage notable for significantly elevated LFTs.  T bili is 10.2.  She is jaundiced on appearance.  Right upper quadrant ultrasound noted for undistended gallbladder.  No evidence of cholecystitis.  Seen by and discussed with Dr. Rhunette CroftNanavati, who recommends admission for further workup.  Appreciate Dr. Loney Lohathore for admitting the patient.    Final Clinical Impressions(s) / ED Diagnoses   Final diagnoses:  Transaminitis    ED Discharge Orders    None       Roxy HorsemanBrowning, Mikenzie Mccannon, PA-C 09/02/18 [id number]    Nanavati, Ankit, MD 09/02/18 2310

## 2018-09-02 NOTE — ED Notes (Signed)
ED TO INPATIENT HANDOFF REPORT  ED Nurse Name and Phone #: [phone]  S Name/Age/Gender Stephanie Booker 58 y.o. female Room/Bed: 039C/039C  Code Status   Code Status: Full Code  Home/SNF/Other Home Patient oriented to: self, place, time and situation Is this baseline? Yes   Triage Complete: Triage complete  Chief Complaint Stomach pain, swollen, dark urin  Triage Note Pt reports feeling "full" x 1 week with generalized abdominal pain and orange urine.   Allergies No Known Allergies  Level of Care/Admitting Diagnosis ED Disposition    ED Disposition Condition [facility] Area: [place] [[id number]]  Level of Care: Med-Surg [16]  Covid Evaluation: Asymptomatic Screening Protocol (No Symptoms)  Diagnosis: Elevated LFTs [321910]  Admitting Physician: Shela Leff [[id number]]  Attending Physician: Shela Leff [[id number]]  Estimated length of stay: past midnight tomorrow  Certification:: I certify this patient will need inpatient services for at least 2 midnights  PT Class (Do Not Modify): Inpatient [101]  PT Acc Code (Do Not Modify): Private [1]       B Medical/Surgery History Past Medical History:  Diagnosis Date  . Anxiety   . Hyperlipidemia   . OSA (obstructive sleep apnea)   . Stroke [facility])    ocular stroke   Past Surgical History:  Procedure Laterality Date  . ABDOMINAL HYSTERECTOMY       A IV Location/Drains/Wounds Patient Lines/Drains/Airways Status   Active Line/Drains/Airways    Name:   Placement date:   Placement time:   Site:   Days:   Peripheral IV 09/02/18 Left Antecubital   09/02/18    0334    Antecubital   less than 1          Intake/Output Last 24 hours  Intake/Output Summary (Last 24 hours) at 09/02/2018 1136 Last data filed at 09/02/2018 0453 Gross per 24 hour  Intake 1000 ml  Output -  Net 1000 ml    Labs/Imaging Results for orders placed or performed during the hospital encounter of  09/01/18 (from the past 48 hour(s))  Lipase, blood     Status: Abnormal   Collection Time: 09/01/18  8:35 PM  Result Value Ref Range   Lipase 67 (H) 11 - 51 U/L    Comment: Performed at [facility] [address] [place], [place] [address]  Comprehensive metabolic panel     Status: Abnormal   Collection Time: 09/01/18  8:35 PM  Result Value Ref Range   Sodium 135 135 - 145 mmol/L   Potassium 3.3 (L) 3.5 - 5.1 mmol/L   Chloride 110 98 - 111 mmol/L   CO2 16 (L) 22 - 32 mmol/L   Glucose, Bld 89 70 - 99 mg/dL   BUN 14 6 - 20 mg/dL   Creatinine, Ser 0.57 0.44 - 1.00 mg/dL   Calcium 8.3 (L) 8.9 - 10.3 mg/dL   Total Protein 6.0 (L) 6.5 - 8.1 g/dL   Albumin 2.7 (L) 3.5 - 5.0 g/dL   AST 1,004 (H) 15 - 41 U/L   ALT 1,183 (H) 0 - 44 U/L   Alkaline Phosphatase 443 (H) 38 - 126 U/L   Total Bilirubin 10.2 (H) 0.3 - 1.2 mg/dL   GFR calc non Af Amer >60 >60 mL/min   GFR calc Af Amer >60 >60 mL/min   Anion gap 9 5 - 15    Comment: Performed at [facility], [place] [address] [place], [place] [address]  CBC  Status: Abnormal   Collection Time: 09/01/18  8:35 PM  Result Value Ref Range   WBC 5.9 4.0 - 10.5 K/uL   RBC 4.23 3.87 - 5.11 MIL/uL   Hemoglobin 13.0 12.0 - 15.0 g/dL   HCT 16.140.6 09.636.0 - 04.546.0 %   MCV 96.0 80.0 - 100.0 fL   MCH 30.7 26.0 - 34.0 pg   MCHC 32.0 30.0 - 36.0 g/dL   RDW 40.915.7 (H) 81.111.5 - 91.415.5 %   Platelets 217 150 - 400 K/uL   nRBC 0.0 0.0 - 0.2 %    Comment: Performed at [facility], [address] [place], [place] [address]  Urinalysis, Routine w reflex microscopic     Status: Abnormal   Collection Time: 09/01/18  8:38 PM  Result Value Ref Range   Color, Urine AMBER (A) YELLOW    Comment: BIOCHEMICALS MAY BE AFFECTED BY COLOR   APPearance CLEAR CLEAR   Specific Gravity, Urine 1.010 1.005 - 1.030   pH 6.0 5.0 - 8.0   Glucose, UA NEGATIVE NEGATIVE mg/dL   Hgb urine dipstick MODERATE (A) NEGATIVE   Bilirubin Urine SMALL (A) NEGATIVE   Ketones, ur  NEGATIVE NEGATIVE mg/dL   Protein, ur NEGATIVE NEGATIVE mg/dL   Nitrite NEGATIVE NEGATIVE   Leukocytes,Ua NEGATIVE NEGATIVE   RBC / HPF 0-5 0 - 5 RBC/hpf   WBC, UA 0-5 0 - 5 WBC/hpf   Bacteria, UA NONE SEEN NONE SEEN   Squamous Epithelial / LPF 0-5 0 - 5   Mucus PRESENT     Comment: Performed at [facility], [address] [place], [place] [address]  Ammonia     Status: Abnormal   Collection Time: 09/02/18  3:35 AM  Result Value Ref Range   Ammonia 85 (H) 9 - 35 umol/L    Comment: Performed at [facility], [address] [place], [place] [address]  Acetaminophen level     Status: Abnormal   Collection Time: 09/02/18  3:35 AM  Result Value Ref Range   Acetaminophen (Tylenol), Serum <10 (L) 10 - 30 ug/mL    Comment: (NOTE) Therapeutic concentrations vary significantly. A range of 10-30 ug/mL  may be an effective concentration for many patients. However, some  are best treated at concentrations outside of this range. Acetaminophen concentrations >150 ug/mL at 4 hours after ingestion  and >50 ug/mL at 12 hours after ingestion are often associated with  toxic reactions. Performed at [facility], [address] [place], [place] [address]   Protime-INR     Status: None   Collection Time: 09/02/18  4:17 AM  Result Value Ref Range   Prothrombin Time 14.8 11.4 - 15.2 seconds   INR 1.2 0.8 - 1.2    Comment: (NOTE) INR goal varies based on device and disease states. Performed at [facility], [address] [place], [place] [address]    Dg Chest 2 View  Result Date: 09/02/2018 CLINICAL DATA:  Chest pain. EXAM: CHEST - 2 VIEW COMPARISON:  Radiographs of July 14, 2013. FINDINGS: The heart size and mediastinal contours are within normal limits. Both lungs are clear. No pneumothorax or pleural effusion is noted. The visualized skeletal structures are unremarkable. IMPRESSION: No active cardiopulmonary disease. Electronically Signed   By: Lupita RaiderJames  Green Jr M.D.    On: 09/02/2018 07:10   [place] Abdomen Limited  Result Date: 09/02/2018 CLINICAL DATA:  Right upper quadrant pain EXAM: ULTRASOUND ABDOMEN LIMITED RIGHT UPPER QUADRANT  COMPARISON:  None. FINDINGS: Gallbladder: Minimally distended gallbladder which accounts for smooth wall thickening that is accentuated by pericholecystic fat. Negative for stone or focal tenderness. No pericholecystic edema. Common bile duct: Diameter: 3 mm Liver: No focal lesion identified. Within normal limits in parenchymal echogenicity. Portal vein is patent on color Doppler imaging with normal direction of blood flow towards the liver. IMPRESSION: 1. No acute finding. 2. Under distended gallbladder with no evidence of calculus. Electronically Signed   By: Marnee SpringJonathon  Watts M.D.   On: 09/02/2018 04:06    Pending Labs Unresulted Labs (From admission, onward)    Start     Ordered   09/03/18 0500  Hepatic function panel  Tomorrow morning,   R     09/02/18 0618   09/03/18 0500  Basic metabolic panel  Tomorrow morning,   R     09/02/18 0713   09/02/18 0616  HIV antibody (Routine Testing)  Once,   STAT     09/02/18 0618   09/02/18 0424  SARS CORONAVIRUS 2 Nasal Swab Aptima Multi Swab  (Asymptomatic/Tier 2 Patients Labs)  Once,   STAT    Question Answer Comment  Is this test for diagnosis or screening Screening   Symptomatic for COVID-19 as defined by CDC No   Hospitalized for COVID-19 No   Admitted to ICU for COVID-19 No   Previously tested for COVID-19 No   Resident in a congregate (group) care setting No   Employed in healthcare setting No   Pregnant No      09/02/18 0423   09/02/18 0326  Hepatitis panel, acute  ONCE - STAT,   STAT     09/02/18 0325          Vitals/Pain Today's Vitals   09/02/18 0945 09/02/18 1000 09/02/18 1015 09/02/18 1022  BP: 117/79 123/73 120/85   Pulse:   69   Resp:   (!) 21   Temp:      TempSrc:      SpO2:   94%   Weight:      Height:      PainSc:    5     Isolation Precautions No  active isolations  Medications Medications  escitalopram (LEXAPRO) tablet 20 mg (20 mg Oral Given 09/02/18 1021)  0.9 %  sodium chloride infusion ( Intravenous New Bag/Given 09/02/18 0750)  HYDROmorphone (DILAUDID) injection 0.5 mg (has no administration in time range)  sodium chloride flush (NS) 0.9 % injection 3 mL (3 mLs Intravenous Given 09/02/18 0340)  fentaNYL (SUBLIMAZE) injection 50 mcg (50 mcg Intravenous Given 09/02/18 0341)  sodium chloride 0.9 % bolus 1,000 mL (0 mLs Intravenous Stopped 09/02/18 0453)  potassium chloride SA (K-DUR) CR tablet 40 mEq (40 mEq Oral Given 09/02/18 0745)    Mobility walks Low fall risk   Focused Assessments GI assess   R Recommendations: See Admitting Provider Note  Report given to:   Additional Notes: Ambulates independently, gait steady

## 2018-09-02 NOTE — Progress Notes (Signed)
Patient seen and examined at bedside, patient admitted after midnight, please see earlier detailed admission note by Shela Leff, MD. Briefly, patient presented secondary to abdominal pain with associated liver enzyme elevations. Concern for acute liver injury. No ductal dilation or gallbladder pathology seen on ultrasound. GI consulted and will see in AM.   Cordelia Poche, MD [facility] 09/02/2018, 4:18 PM

## 2018-09-02 NOTE — Progress Notes (Signed)
Stephanie Booker [id number] Admission Data: 09/02/2018 12:09 PM Attending Provider: Mariel Aloe, MD  BOF:BPZWCHEN, Real Cons, MD  Stephanie Booker is a 58 y.o. female patient admitted from ED awake, alert  & orientated  X 3,  Full Code, VSS - Blood pressure 120/76, pulse 79, temperature 98.3 F (36.8 C), temperature source Oral, resp. rate 18, height 4' 10.5" (1.486 m), weight 62.6 kg, SpO2 98 %., on RA, no c/o shortness of breath, no c/o chest pain, no distress noted.  Pt orientation to unit, room and routine. Information packet given to patient/family and safety video watched.  Admission INP armband ID verified with patient/family, and in place. SR up x 2, fall risk assessment complete with Patient and family verbalizing understanding of risks associated with falls. Pt verbalizes an understanding of how to use the call bell and to call for help before getting out of bed.  Skin, clean-dry- intact without evidence of bruising, or skin tears.    Will cont to monitor and assist as needed.  Hiram Comber, RN 09/02/2018 12:09 PM

## 2018-09-02 NOTE — ED Notes (Signed)
Pt states she is walking to her car and will be right back

## 2018-09-02 NOTE — Consult Note (Signed)
[facility] Consult: 4:16 PM 09/02/2018  LOS: 0 days    Referring Provider: Dr Lonny Prude  Primary Care Physician:  Hoyt Koch, MD Primary Gastroenterologist:  Dr. In [facility]     Reason for Consultation: Cholestatic hepatitis.   HPI: Stephanie Booker is a 58 y.o. female.  Hx hld.  Htn.  Ocular stroke, pseudotumor cerebri.  OSA.  Depression/anxiety.  S/p hysterectomy.   Colonoscopy in [place] was unremarkable.  She is on a 10-year follow-up schedule  About 3 weeks ago, 2 cholesterol medications were stopped because she was having deep aching in her bones.  To deal with the discomfort she was taking up to 6, 650 mg, Tylenol per day.  She sustained this for 3 weeks but stopped 1 week ago.  The deep aches have resolved.  For 10 days, corresponding with when she stopped the Tylenol she has been having progressive abdominal swelling, fullness and pain in the mid abdomen starting at the epigastrium down to the umbilicus.  It is not severe pain.  Does not radiate to her back.  Because of the fullness she has lost her appetite and has early satiety.  There is no nausea or vomiting.  No chills or sweats.  Deep orange color to the urine.  Fatigue.  No fever or chills.  Rarely drinks beer.  She does not use herbal supplements.  T bili 10.2 Alkaline phosphatase 443 AST/ALT 1004/1183 INR 1.2 Ammonia 85 APAP level < 10.  EtOH level less than 10 CBC normal including platelets of 217 COVID-19 negative  Abdominal ultrasound shows minimal GB distention, no evidence of cholecystitis.  No gallbladder stones.  3 mm CBD.  Normal liver.  Portal vein flow normal.  Father was an alcoholic and had liver disease, cirrhosis.  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  There is no COVID infection on the ward where she  works.  Past Medical History:  Diagnosis Date  . Anxiety   . Hyperlipidemia   . OSA (obstructive sleep apnea)   . Stroke [facility])    ocular stroke    Past Surgical History:  Procedure Laterality Date  . ABDOMINAL HYSTERECTOMY      Prior to Admission medications   Medication Sig Start Date End Date Taking? Authorizing Provider  acetaminophen (TYLENOL) 500 MG tablet Take 500 mg by mouth every 6 (six) hours as needed for mild pain.   Yes [provider]  acetaZOLAMIDE (DIAMOX) 250 MG tablet TAKE 1 TABLET BY MOUTH 2 (TWO) TIMES DAILY. NEED ANNUAL APPOINTMENT FOR FURTHER REFILLS Patient taking differently: Take 250 mg by mouth 2 (two) times daily.  08/01/18  Yes Hoyt Koch, MD  aspirin EC 325 MG tablet Take 1 tablet (325 mg total) by mouth daily. 07/15/13  Yes Buriev, Arie Sabina, MD  escitalopram (LEXAPRO) 20 MG tablet Take 1 tablet (20 mg total) by mouth daily. 02/26/18  Yes Hoyt Koch, MD  KLOR-CON M10 10 MEQ tablet TAKE 1 TABLET BY MOUTH EVERY DAY *NEEDS APPT* Patient taking differently: Take 10 mEq by  mouth daily.  08/29/18  Yes Hoyt Koch, MD  atorvastatin (LIPITOR) 20 MG tablet Take 1 tablet (20 mg total) by mouth daily at 6 PM. Need appointment for further refills Patient not taking: Reported on 09/02/2018 08/21/18   Hoyt Koch, MD  fenofibrate 160 MG tablet TAKE 1 TABLET (160 MG TOTAL) BY MOUTH DAILY. NEED ANNUAL APPOINTMENT FOR FURTHER REFILLS Patient not taking: Reported on 09/02/2018 07/10/18   Hoyt Koch, MD  valACYclovir (VALTREX) 1000 MG tablet Take 2 tablets (2,000 mg total) by mouth 2 (two) times daily. Patient not taking: Reported on 09/02/2018 02/13/17   Hoyt Koch, MD    Scheduled Meds: . escitalopram  20 mg Oral Daily   Infusions: . sodium chloride 125 mL/hr at 09/02/18 0750   PRN Meds: HYDROmorphone (DILAUDID) injection   Allergies as of 09/01/2018  . (No Known Allergies)    Family History   Problem Relation Age of Onset  . Breast cancer Mother     Social History   Socioeconomic History  . Marital status: Married    Spouse name: Not on file  . Number of children: 0  . Years of education: 12th  . Highest education level: Not on file  Occupational History  . Occupation: cna    Employer: [employer]  Social Needs  . Financial resource strain: Not on file  . Food insecurity    Worry: Not on file    Inability: Not on file  . Transportation needs    Medical: Not on file    Non-medical: Not on file  Tobacco Use  . Smoking status: Current Every Day Smoker    Packs/day: 1.00    Start date: 01/23/1975  . Smokeless tobacco: Never Used  Substance and Sexual Activity  . Alcohol use: Yes    Alcohol/week: 0.0 standard drinks    Comment: occassionally  . Drug use: No  . Sexual activity: Yes  Lifestyle  . Physical activity    Days per week: Not on file    Minutes per session: Not on file  . Stress: Not on file  Relationships  . Social Herbalist on phone: Not on file    Gets together: Not on file    Attends religious service: Not on file    Active member of club or organization: Not on file    Attends meetings of clubs or organizations: Not on file    Relationship status: Not on file  . Intimate partner violence    Fear of current or ex partner: Not on file    Emotionally abused: Not on file    Physically abused: Not on file    Forced sexual activity: Not on file  Other Topics Concern  . Not on file  Social History Narrative   Patient lives at home with her husband    Patient is right handed   Patient coffee daily    REVIEW OF SYSTEMS: Constitutional: Malaise, weakness, fatigue. ENT:  No nose bleeds Pulm: Because of the abdominal swelling, it feels like it is hard for her to take a deep breath but she does not feel short of breath.  No cough. CV:  No palpitations, no LE edema.  GU:  No hematuria, no frequency.  Urine is deep orange. GI: See HPI  Heme: Denies unusual bleeding or bruising. Transfusions: No previous blood product transfusions. Neuro:  No headaches, no peripheral tingling or numbness.  Syncope.  No seizures. Derm:  No itching, no  rash or sores.  Endocrine:  No sweats or chills.  No polyuria or dysuria Immunization: Viewed.  Did not ask her about vaccination history, specifically about hepatitis B. Travel:  None beyond local counties in last few months.    PHYSICAL EXAM: Vital signs in last 24 hours: Vitals:   09/02/18 1015 09/02/18 1202  BP: 120/85 120/76  Pulse: 69 79  Resp: (!) 21 18  Temp:  98.3 F (36.8 C)  SpO2: 94% 98%   Wt Readings from Last 3 Encounters:  09/02/18 62.6 kg  02/26/18 66.2 kg  11/20/17 65.3 kg    General: Patient is jaundice.  She is comfortable.  Overweight. Head: No facial asymmetry or swelling.  No signs of head trauma. Eyes: Sclera icteric.  Conjunctiva pink. Ears: Not hard of hearing Nose: No congestion or discharge Mouth: Only has front teeth remaining.  Mucosa is pink, moist, clear. Neck: No JVD, no masses, no thyromegaly. Lungs: Clear bilaterally no labored breathing.  No cough Heart: RRR.  No MRG.  S1, S2 present. Abdomen: Soft.  Not tender or distended.  No HSM, masses, bruits, hernias.   Rectal: deferred Musc/Skeltl: no joint redness or swelling Extremities: No CCE. Neurologic: Oriented x3.  No asterixis, no limb weakness. Skin: No telangiectasia, no sores, no significant purpura. Nodes: No cervical adenopathy. Psych: Cooperative, pleasant, somewhat anxious.  Intake/Output from previous day: 08/10 0701 - 08/11 0700 In: 1000 [IV Piggyback:1000] Out: -  Intake/Output this shift: Total I/O In: 545.9 [I.V.:545.9] Out: -   LAB RESULTS: Recent Labs    09/01/18 2035  WBC 5.9  HGB 13.0  HCT 40.6  PLT 217   BMET Lab Results  Component Value Date   NA 135 09/01/2018   NA 141 02/13/2017   NA 139 04/11/2016   K 3.3 (L) 09/01/2018   K 3.9 02/13/2017   K  3.9 04/11/2016   CL 110 09/01/2018   CL 115 (H) 02/13/2017   CL 115 (H) 04/11/2016   CO2 16 (L) 09/01/2018   CO2 17 (L) 02/13/2017   CO2 19 04/11/2016   GLUCOSE 89 09/01/2018   GLUCOSE 88 02/13/2017   GLUCOSE 90 04/11/2016   BUN 14 09/01/2018   BUN 22 02/13/2017   BUN 23 04/11/2016   CREATININE 0.57 09/01/2018   CREATININE 0.84 02/13/2017   CREATININE 0.83 04/11/2016   CALCIUM 8.3 (L) 09/01/2018   CALCIUM 9.1 02/13/2017   CALCIUM 9.2 04/11/2016   LFT Recent Labs    09/01/18 2035  PROT 6.0*  ALBUMIN 2.7*  AST 1,004*  ALT 1,183*  ALKPHOS 443*  BILITOT 10.2*   PT/INR Lab Results  Component Value Date   INR 1.2 09/02/2018   INR 1.01 09/17/2013   INR 0.89 07/14/2013   Hepatitis Panel No results for input(s): HEPBSAG, HCVAB, HEPAIGM, HEPBIGM in the last 72 hours. C-Diff No components found for: CDIFF Lipase     Component Value Date/Time   LIPASE 67 (H) 09/01/2018 2035    Drugs of Abuse     Component Value Date/Time   LABOPIA NONE DETECTED 09/17/2013 1509   COCAINSCRNUR NONE DETECTED 09/17/2013 1509   LABBENZ NONE DETECTED 09/17/2013 1509   AMPHETMU NONE DETECTED 09/17/2013 1509   THCU NONE DETECTED 09/17/2013 1509   LABBARB NONE DETECTED 09/17/2013 1509     RADIOLOGY STUDIES: Dg Chest 2 View  Result Date: 09/02/2018 CLINICAL DATA:  Chest pain. EXAM: CHEST - 2 VIEW COMPARISON:  Radiographs of July 14, 2013. FINDINGS: The heart size and mediastinal contours  are within normal limits. Both lungs are clear. No pneumothorax or pleural effusion is noted. The visualized skeletal structures are unremarkable. IMPRESSION: No active cardiopulmonary disease. Electronically Signed   By: Marijo Conception M.D.   On: 09/02/2018 07:10   US Abdomen Limited  Result Date: 09/02/2018 CLINICAL DATA:  Right upper quadrant pain EXAM: ULTRASOUND ABDOMEN LIMITED RIGHT UPPER QUADRANT COMPARISON:  None. FINDINGS: Gallbladder: Minimally distended gallbladder which accounts for smooth  wall thickening that is accentuated by pericholecystic fat. Negative for stone or focal tenderness. No pericholecystic edema. Common bile duct: Diameter: 3 mm Liver: No focal lesion identified. Within normal limits in parenchymal echogenicity. Portal vein is patent on color Doppler imaging with normal direction of blood flow towards the liver. IMPRESSION: 1. No acute finding. 2. Under distended gallbladder with no evidence of calculus. Electronically Signed   By: Monte Fantasia M.D.   On: 09/02/2018 04:06      IMPRESSION:   *  Acute hepatitis, cholestatic pattern.  Normal. R/o acute viral hep, serologies pending.  Given that she stopped taking the Tylenol a week ago and her APAP level is normal, less suspicious this is acute drug injury from acetaminophen. R/o AIH.  Serologies ordered No evidence of gallbladder/biliary disorder.  Liver parenchyma normal.    PLAN:     *    Await pending labs.  C-Met, INR repeat in morning  *      MRCP canceled, given normal ultrasound, MRCP will not help make the diagnosis.  *     Allow diet, wrote for heart healthy.   Azucena Freed  09/02/2018, 4:16 PM Phone [phone]

## 2018-09-02 NOTE — H&P (Signed)
History and Physical    Stephanie Booker NID:[id number] DOB: 02-07-1960 DOA: 09/01/2018  PCP: Hoyt Koch, MD Patient coming from: Home  Chief Complaint: Abdominal pain  HPI: Stephanie Booker is a 58 y.o. female with medical history significant of hyperlipidemia, anxiety, OSA, CVA presenting to the hospital for evaluation of abdominal pain.  Patient reports 1 week history of generalized abdominal pain and abdominal fullness.  Denies any nausea, vomiting, or diarrhea.  Denies any fevers or chills.  Denies any change in her medications/new medications.  States she drinks alcohol once every 6 months.  Reports taking Tylenol for chronic pain.  She has also been feeling short of breath, unclear how long.  ED Course: Afebrile and hemodynamically stable.  No leukocytosis.  Lipase 67.  AST 1004, ALT 1183, alk phos 443, and T bili 10.2.  LFTs were normal in January 2019.  Potassium 3.3.  Bicarb 16, anion gap 9.  BUN 14, creatinine 0.5.  Ammonia 85.  INR 1.2.  Acetaminophen level negative.  UA not suggestive of infection.  COVID-19 test pending.  Right upper quadrant ultrasound negative for acute finding.  Under distended gallbladder with no evidence of calculus.  CBD diameter 3 mm. Received 1 L IV fluid bolus and fentanyl.  Review of Systems:  All systems reviewed and apart from history of presenting illness, are negative.  Past Medical History:  Diagnosis Date  . Anxiety   . Hyperlipidemia   . OSA (obstructive sleep apnea)   . Stroke [facility])    ocular stroke    Past Surgical History:  Procedure Laterality Date  . ABDOMINAL HYSTERECTOMY       reports that she has been smoking. She started smoking about 43 years ago. She has been smoking about 1.00 pack per day. She has never used smokeless tobacco. She reports current alcohol use. She reports that she does not use drugs.  No Known Allergies  Family History  Problem Relation Age of Onset  . Breast cancer Mother     Prior to  Admission medications   Medication Sig Start Date End Date Taking? Authorizing Provider  acetaminophen (TYLENOL) 500 MG tablet Take 500 mg by mouth every 6 (six) hours as needed for mild pain.   Yes [provider]  acetaZOLAMIDE (DIAMOX) 250 MG tablet TAKE 1 TABLET BY MOUTH 2 (TWO) TIMES DAILY. NEED ANNUAL APPOINTMENT FOR FURTHER REFILLS Patient taking differently: Take 250 mg by mouth 2 (two) times daily.  08/01/18  Yes Hoyt Koch, MD  aspirin EC 325 MG tablet Take 1 tablet (325 mg total) by mouth daily. 07/15/13  Yes Buriev, Arie Sabina, MD  escitalopram (LEXAPRO) 20 MG tablet Take 1 tablet (20 mg total) by mouth daily. 02/26/18  Yes Hoyt Koch, MD  KLOR-CON M10 10 MEQ tablet TAKE 1 TABLET BY MOUTH EVERY DAY *NEEDS APPT* Patient taking differently: Take 10 mEq by mouth daily.  08/29/18  Yes Hoyt Koch, MD  atorvastatin (LIPITOR) 20 MG tablet Take 1 tablet (20 mg total) by mouth daily at 6 PM. Need appointment for further refills Patient not taking: Reported on 09/02/2018 08/21/18   Hoyt Koch, MD  fenofibrate 160 MG tablet TAKE 1 TABLET (160 MG TOTAL) BY MOUTH DAILY. NEED ANNUAL APPOINTMENT FOR FURTHER REFILLS Patient not taking: Reported on 09/02/2018 07/10/18   Hoyt Koch, MD  valACYclovir (VALTREX) 1000 MG tablet Take 2 tablets (2,000 mg total) by mouth 2 (two) times daily. Patient not taking: Reported on 09/02/2018 02/13/17  Hoyt Koch, MD    Physical Exam: Vitals:   09/02/18 0500 09/02/18 0530 09/02/18 0649 09/02/18 0700  BP: 108/80 (!) 120/94 113/77 (!) 122/92  Pulse: 73 78 73 71  Resp: (!) 21 19 (!) 22 20  Temp:      TempSrc:      SpO2: 94% 90% 95% 92%  Weight:      Height:        Physical Exam  Constitutional: She is oriented to person, place, and time. She appears well-developed and well-nourished. No distress.  HENT:  Head: Normocephalic.  Mouth/Throat: Oropharynx is clear and moist.  Eyes: Right eye  exhibits no discharge. Left eye exhibits no discharge. Scleral icterus is present.  Neck: Neck supple.  Cardiovascular: Normal rate, regular rhythm and intact distal pulses.  Pulmonary/Chest: Effort normal. No respiratory distress. She has no wheezes. She has rales.  Bibasilar rales, left greater than right  Abdominal: Soft. Bowel sounds are normal. She exhibits distension. There is abdominal tenderness. There is no rebound and no guarding.  Generalized tenderness to palpation  Musculoskeletal:        General: No edema.  Neurological: She is alert and oriented to person, place, and time.  Skin: Skin is warm and dry. She is not diaphoretic.  Appears jaundiced     Labs on Admission: I have personally reviewed following labs and imaging studies  CBC: Recent Labs  Lab 09/01/18 2035  WBC 5.9  HGB 13.0  HCT 40.6  MCV 96.0  PLT 703   Basic Metabolic Panel: Recent Labs  Lab 09/01/18 2035  NA 135  K 3.3*  CL 110  CO2 16*  GLUCOSE 89  BUN 14  CREATININE 0.57  CALCIUM 8.3*   GFR: Estimated Creatinine Clearance: 61.6 mL/min (by C-G formula based on SCr of 0.57 mg/dL). Liver Function Tests: Recent Labs  Lab 09/01/18 2035  AST 1,004*  ALT 1,183*  ALKPHOS 443*  BILITOT 10.2*  PROT 6.0*  ALBUMIN 2.7*   Recent Labs  Lab 09/01/18 2035  LIPASE 67*   Recent Labs  Lab 09/02/18 0335  AMMONIA 85*   Coagulation Profile: Recent Labs  Lab 09/02/18 0417  INR 1.2   Cardiac Enzymes: No results for input(s): CKTOTAL, CKMB, CKMBINDEX, TROPONINI in the last 168 hours. BNP (last 3 results) No results for input(s): PROBNP in the last 8760 hours. HbA1C: No results for input(s): HGBA1C in the last 72 hours. CBG: No results for input(s): GLUCAP in the last 168 hours. Lipid Profile: No results for input(s): CHOL, HDL, LDLCALC, TRIG, CHOLHDL, LDLDIRECT in the last 72 hours. Thyroid Function Tests: No results for input(s): TSH, T4TOTAL, FREET4, T3FREE, THYROIDAB in the last 72  hours. Anemia Panel: No results for input(s): VITAMINB12, FOLATE, FERRITIN, TIBC, IRON, RETICCTPCT in the last 72 hours. Urine analysis:    Component Value Date/Time   COLORURINE AMBER (A) 09/01/2018 2038   APPEARANCEUR CLEAR 09/01/2018 2038   LABSPEC 1.010 09/01/2018 2038   PHURINE 6.0 09/01/2018 2038   GLUCOSEU NEGATIVE 09/01/2018 2038   HGBUR MODERATE (A) 09/01/2018 2038   BILIRUBINUR SMALL (A) 09/01/2018 2038   KETONESUR NEGATIVE 09/01/2018 2038   PROTEINUR NEGATIVE 09/01/2018 2038   UROBILINOGEN 0.2 09/17/2013 1509   NITRITE NEGATIVE 09/01/2018 2038   LEUKOCYTESUR NEGATIVE 09/01/2018 2038    Radiological Exams on Admission: Dg Chest 2 View  Result Date: 09/02/2018 CLINICAL DATA:  Chest pain. EXAM: CHEST - 2 VIEW COMPARISON:  Radiographs of July 14, 2013. FINDINGS: The heart size and  mediastinal contours are within normal limits. Both lungs are clear. No pneumothorax or pleural effusion is noted. The visualized skeletal structures are unremarkable. IMPRESSION: No active cardiopulmonary disease. Electronically Signed   By: Marijo Conception M.D.   On: 09/02/2018 07:10   US Abdomen Limited  Result Date: 09/02/2018 CLINICAL DATA:  Right upper quadrant pain EXAM: ULTRASOUND ABDOMEN LIMITED RIGHT UPPER QUADRANT COMPARISON:  None. FINDINGS: Gallbladder: Minimally distended gallbladder which accounts for smooth wall thickening that is accentuated by pericholecystic fat. Negative for stone or focal tenderness. No pericholecystic edema. Common bile duct: Diameter: 3 mm Liver: No focal lesion identified. Within normal limits in parenchymal echogenicity. Portal vein is patent on color Doppler imaging with normal direction of blood flow towards the liver. IMPRESSION: 1. No acute finding. 2. Under distended gallbladder with no evidence of calculus. Electronically Signed   By: Monte Fantasia M.D.   On: 09/02/2018 04:06    Assessment/Plan Principal Problem:   Elevated LFTs Active Problems:    OSA on CPAP   Hypokalemia   Metabolic acidosis   Dyspnea   Acute liver injury Lipase 67.  AST 1004, ALT 1183, alk phos 443, and T bili 10.2.  LFTs were normal in January 2019.  Acetaminophen level negative.  Ammonia 85, no confusion or somnolence.  INR 1.2.  Right upper quadrant ultrasound negative for acute finding.  Under distended gallbladder with no evidence of calculus.  CBD diameter 3 mm.  Suspect possible choledocholithiasis given cholestatic pattern seen on labs.  No fever or leukocytosis to suggest acute cholangitis.  -Keep n.p.o. IV fluid hydration. -Consult GI in a.m. for MRCP/ERCP. -Continue to monitor LFTs -Dilaudid PRN pain -Hepatitis panel  Hypokalemia Potassium 3.3.  Likely related to home diuretic use. -Replete potassium.  Check magnesium level.  Continue to monitor BMP.  Normal anion gap metabolic acidosis Bicarb 16, anion gap 9.  Likely secondary to home diuretic use. -IV fluid hydration -Hold diuretic at this time -Continue to monitor BMP  Dyspnea Patient reports history of shortness of breath, unclear how long.  Not tachypneic or hypoxic on exam.  Bibasilar rales (left greater than right) appreciated on auscultation. -Chest x-ray  OSA -CPAP at night  Depression -Continue home Lexapro  DVT prophylaxis: SCDs at this time Code Status: Patient wishes to be full code. Family Communication: Husband at bedside. Disposition Plan: Anticipate discharge after clinical improvement. Consults called: None Admission status: It is my clinical opinion that admission to INPATIENT is reasonable and necessary in this 58 y.o. female . presenting with acute liver injury, suspect choledocholithiasis.  Will need GI consultation in a.m. for ERCP.  Given the aforementioned, the predictability of an adverse outcome is felt to be significant. I expect that the patient will require at least 2 midnights in the hospital to treat this condition.   The medical decision making on this  patient was of high complexity and the patient is at high risk for clinical deterioration, therefore this is a level 3 visit.  Shela Leff MD [facility] Pager [phone]  If 7PM-7AM, please contact night-coverage [email or web address] Password [facility]  09/02/2018, 7:13 AM

## 2018-09-03 DIAGNOSIS — R0600 Dyspnea, unspecified: Secondary | ICD-10-CM

## 2018-09-03 DIAGNOSIS — E876 Hypokalemia: Secondary | ICD-10-CM

## 2018-09-03 DIAGNOSIS — Z9989 Dependence on other enabling machines and devices: Secondary | ICD-10-CM

## 2018-09-03 DIAGNOSIS — G4733 Obstructive sleep apnea (adult) (pediatric): Secondary | ICD-10-CM

## 2018-09-03 DIAGNOSIS — B169 Acute hepatitis B without delta-agent and without hepatic coma: Secondary | ICD-10-CM

## 2018-09-03 DIAGNOSIS — E872 Acidosis: Secondary | ICD-10-CM

## 2018-09-03 DIAGNOSIS — K759 Inflammatory liver disease, unspecified: Secondary | ICD-10-CM

## 2018-09-03 LAB — HEPATIC FUNCTION PANEL
ALT: 976 U/L — ABNORMAL HIGH (ref 0–44)
AST: 854 U/L — ABNORMAL HIGH (ref 15–41)
Albumin: 2.2 g/dL — ABNORMAL LOW (ref 3.5–5.0)
Alkaline Phosphatase: 395 U/L — ABNORMAL HIGH (ref 38–126)
Bilirubin, Direct: 7.6 mg/dL — ABNORMAL HIGH (ref 0.0–0.2)
Indirect Bilirubin: 4.6 mg/dL — ABNORMAL HIGH (ref 0.3–0.9)
Total Bilirubin: 12.2 mg/dL — ABNORMAL HIGH (ref 0.3–1.2)
Total Protein: 5.5 g/dL — ABNORMAL LOW (ref 6.5–8.1)

## 2018-09-03 LAB — CBC WITH DIFFERENTIAL/PLATELET
Abs Immature Granulocytes: 0.02 10*3/uL (ref 0.00–0.07)
Basophils Absolute: 0.1 10*3/uL (ref 0.0–0.1)
Basophils Relative: 1 %
Eosinophils Absolute: 0.1 10*3/uL (ref 0.0–0.5)
Eosinophils Relative: 1 %
HCT: 36 % (ref 36.0–46.0)
Hemoglobin: 11.7 g/dL — ABNORMAL LOW (ref 12.0–15.0)
Immature Granulocytes: 0 %
Lymphocytes Relative: 20 %
Lymphs Abs: 1.1 10*3/uL (ref 0.7–4.0)
MCH: 30.1 pg (ref 26.0–34.0)
MCHC: 32.5 g/dL (ref 30.0–36.0)
MCV: 92.5 fL (ref 80.0–100.0)
Monocytes Absolute: 0.7 10*3/uL (ref 0.1–1.0)
Monocytes Relative: 14 %
Neutro Abs: 3.3 10*3/uL (ref 1.7–7.7)
Neutrophils Relative %: 64 %
Platelets: 167 10*3/uL (ref 150–400)
RBC: 3.89 MIL/uL (ref 3.87–5.11)
RDW: 16 % — ABNORMAL HIGH (ref 11.5–15.5)
WBC: 5.2 10*3/uL (ref 4.0–10.5)
nRBC: 0 % (ref 0.0–0.2)

## 2018-09-03 LAB — BASIC METABOLIC PANEL
Anion gap: 6 (ref 5–15)
BUN: 11 mg/dL (ref 6–20)
CO2: 19 mmol/L — ABNORMAL LOW (ref 22–32)
Calcium: 8.2 mg/dL — ABNORMAL LOW (ref 8.9–10.3)
Chloride: 112 mmol/L — ABNORMAL HIGH (ref 98–111)
Creatinine, Ser: 0.57 mg/dL (ref 0.44–1.00)
GFR calc Af Amer: >60 mL/min (ref >60)
GFR calc non Af Amer: >60 mL/min (ref >60)
Glucose, Bld: 86 mg/dL (ref 70–99)
Potassium: 3.7 mmol/L (ref 3.5–5.1)
Sodium: 137 mmol/L (ref 135–145)

## 2018-09-03 LAB — PROTIME-INR
INR: 1.2 (ref 0.8–1.2)
Prothrombin Time: 15 seconds (ref 11.4–15.2)

## 2018-09-03 LAB — HEPATITIS PANEL, ACUTE
HCV Ab: <0.1 s/co ratio (ref 0.0–0.9)
Hep A IgM: NEGATIVE
Hep B C IgM: POSITIVE — AB
Hepatitis B Surface Ag: POSITIVE — AB

## 2018-09-03 LAB — LIPASE, BLOOD: Lipase: 53 U/L — ABNORMAL HIGH (ref 11–51)

## 2018-09-03 LAB — PHOSPHORUS: Phosphorus: 3.1 mg/dL (ref 2.5–4.6)

## 2018-09-03 LAB — MAGNESIUM: Magnesium: 1.7 mg/dL (ref 1.7–2.4)

## 2018-09-03 MED ORDER — OXYCODONE HCL 5 MG PO TABS
5.0000 mg | ORAL_TABLET | Freq: Four times a day (QID) | ORAL | Status: DC | PRN
  Administered 2018-09-03 – 2018-09-04 (×4): 5 mg via ORAL
  Filled 2018-09-03 (×4): qty 1

## 2018-09-03 MED ORDER — ASPIRIN EC 325 MG PO TBEC
325.0000 mg | DELAYED_RELEASE_TABLET | Freq: Every day | ORAL | Status: DC
  Administered 2018-09-03 – 2018-09-04 (×2): 325 mg via ORAL
  Filled 2018-09-03 (×2): qty 1

## 2018-09-03 MED ORDER — POTASSIUM CHLORIDE CRYS ER 10 MEQ PO TBCR
10.0000 meq | EXTENDED_RELEASE_TABLET | Freq: Every day | ORAL | Status: DC
  Administered 2018-09-03: 10 meq via ORAL
  Filled 2018-09-03 (×3): qty 1

## 2018-09-03 MED ORDER — ACETAZOLAMIDE 250 MG PO TABS
250.0000 mg | ORAL_TABLET | Freq: Two times a day (BID) | ORAL | Status: DC
  Administered 2018-09-03 – 2018-09-04 (×3): 250 mg via ORAL
  Filled 2018-09-03 (×4): qty 1

## 2018-09-03 NOTE — Progress Notes (Addendum)
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  She was admitted for an acute liver injury and abnormal LFTs with hyperbilirubinemia.  Gastroenterology was consulted and they are recommending ruling out acute viral hepatitis with serologies and ruling out AIH as well as following liver functions closely.  Patient has no evidence of gallbladder or biliary disorder on ultrasound.  LFTs are trending down but bili started trending up today.  Assessment & Plan:   Principal Problem:   Elevated LFTs Active Problems:   OSA on CPAP   Hypokalemia   Metabolic acidosis   Dyspnea   Acute Hepatitis/Liver Injury with Abnormal LFT's, Elevated Alk Phos, and Hyperbilirubinemia in the setting of Acute Hepatitis B with jaundice -On Admission Lipase 67.  AST 1004, ALT 1183, alk phos 443, and T bili 10.2.  -LFTs were normal in January 2019.   -Acetaminophen level negative and less than 10.  Ammonia 85, no confusion or somnolence. -INR 1.2.   -Right upper quadrant ultrasound negative for acute finding.  Under distended gallbladder with no evidence of calculus.  CBD diameter 3 mm.   -? Suspected possible choledocholithiasis given cholestatic pattern seen on labs but more like a Viral Hepatitis .   -No fever or leukocytosis to suggest acute cholangitis.  -IVF now stopped and Diet advanced -Consult GI and they have canceled MRCP/ERCP. -Patient's alk phos is now 395, lipase level is trending down to 53, AST is now 854, ALT is now 976, and total bilirubin is 12.2 with direct of 7.6  and indirect of 4.6 -Acute hepatitis panel done and showed negative hep A antibody IgM, however hepatitis B surface antigen was positive and hepatitis B core antibody IgM was positive -HCV antibody was negative at less than 0.1 -Further care per GI and evaluation recommendations -Continue to monitor and trend LFTs and repeat CMP in a.m. -Continue with IV hydromorphone 2.5 mg every 3 hours PRN for severe pain for abdominal pain along with oxycodone 5 mg every 6 pain for moderate pain  Hypokalemia, improved -Potassium 3.3 on admission.  Likely related to home diuretic use. -Replete potassium and now potassium is 3.7.   -Continue to Monitor and Replete as Necessary -Repeat CMP in AM   Normal Anion Gap Metabolic Acidosis -Bicarb 16, anion gap 9 on Admisison.  Likely secondary to home diuretic use. -IV fluid hydration now stoped -Bicarbonate is now 19 and AG is 6 -Resumed Home Acetazolamide now  -Continue to monitor and repeat CMP in AM   Dyspnea -Patient reports history of shortness of breath, unclear how long.   -Not tachypneic or hypoxic on exam.  Bibasilar rales (left greater than right) appreciated on auscultation. -Chest x-ray showed "The heart size and mediastinal contours are within normal limits. Both lungs are clear. No pneumothorax or pleural effusion is noted. The visualized skeletal structures are unremarkable." -We will need home amatory screen prior to discharge and likely dyspnea secondary to abdominal distention and pressure  OSA -C/w CPAP at night  Depression and Anxiety  -Continue home Escitalopram   Normocytic Anemia -Patient's hemoglobin/hematocrit went from 13.0/40.6 is now 11.7/36.0 -Check  anemia panel in a.m. -Continue to monitor for signs and symptoms of bleeding; currently no overt bleeding noted -Repeat CBC in a.m.  Hyperlipidemia -Had significant amount of myalgias and no longer taking atorvastatin 20 g p.o. nightly along with fenofibrate 100  symptoms p.o. daily -May be a candidate for PC KS 9 inhibitor and will defer to outpatient  History of Ocular Stroke -Continue aspirin 325 mg p.o. daily and statin has now been stopped due to significant myalgias  Pseudotumor Cerebri -Continue with acetazolamide 250 mg p.o. twice daily along with potassium chloride 10 mg p.o. daily  DVT prophylaxis: SCDs Code Status: FULL CODE  Family Communication: No family present at bedside  Disposition Plan: Remain inpatient for continued monitoring and trending of liver functions   Consultants:   Gastroenterology Dr. Gerrit Heck    Procedures: None   Antimicrobials:  Anti-infectives (From admission, onward)   None     Subjective: Seen and examined at bedside and states that her abdominal distention is slightly better.  States that she has been yellow for a few days now.  No nausea or vomiting.  No chest pain, lightheadedness or dizziness.  Was wanting to know why her acetazolamide was stopped.  No other concerns or complaints at this time.  Objective: Vitals:   09/02/18 1000 09/02/18 1015 09/02/18 1202 09/03/18 0652  BP: 123/73 120/85 120/76 111/78  Pulse:  69 79 (!) 59  Resp:  (!) _0 Temp:   98.3 F (36.8 C) 98.5 F (36.9 C)  TempSrc:   Oral Oral  SpO2:  94% 98% 96%  Weight:      Height:        Intake/Output Summary (Last 24 hours) at 09/03/2018 1115 Last data filed at 09/03/2018 1010 Gross per 24 hour  Intake 785.87 ml  Output 200 ml  Net 585.87 ml   Filed Weights   09/02/18 0311  Weight: 62.6 kg   Examination: Physical Exam:  Constitutional: WN/WD overweight jaundice Caucasian female in NAD and appears calm  Eyes: Lids and conjunctivae normal, sclerae icteric  ENMT: External Ears, Nose appear normal. Grossly normal hearing. Mucous membranes are moist. Neck: Appears normal, supple, no cervical masses, normal ROM, no appreciable thyromegaly; no JVD Respiratory: Diminihsed to auscultation bilaterally, no  wheezing, rales, rhonchi or crackles. Normal respiratory effort and patient is not tachypenic. No accessory muscle use.  Cardiovascular: RRR, no murmurs / rubs / gallops. S1 and S2 auscultated. No extremity edema. 2+ pedal pulses. No carotid bruits.  Abdomen: Soft, milldy tender, Distended somehwat. No masses palpated. No appreciable hepatosplenomegaly. Bowel sounds positive x4.  GU: Deferred. Musculoskeletal: No clubbing / cyanosis of digits/nails. No joint deformity upper and lower extremities. Skin: Jaundiced skin but has no rashes, lesions, ulcers on a limited skin evaluation. No induration; Warm and dry.  Neurologic: CN 2-12 grossly intact with no focal deficits. Romberg sign and cerebellar reflexes not assessed.  Psychiatric: Normal judgment and insight. Alert and oriented x 3. Slightly anxious mood and appropriate affect.   Data Reviewed: I have personally reviewed following labs and imaging studies  CBC: Recent Labs  Lab 09/01/18 2035 09/03/18 0934  WBC 5.9 5.2  NEUTROABS  --  3.3  HGB 13.0 11.7*  HCT 40.6 36.0  MCV 96.0 92.5  PLT 217 923   Basic Metabolic Panel: Recent Labs  Lab 09/01/18 2035 09/03/18 0228 09/03/18 0934  NA 135 137  --   K 3.3* 3.7  --   CL 110 112*  --  CO2 16* 19*  --   GLUCOSE 89 86  --   BUN 14 11  --   CREATININE 0.57 0.57  --   CALCIUM 8.3* 8.2*  --   MG  --   --  1.7  PHOS  --   --  3.1   GFR: Estimated Creatinine Clearance: 61.6 mL/min (by C-G formula based on SCr of 0.57 mg/dL). Liver Function Tests: Recent Labs  Lab 09/01/18 2035 09/03/18 0228  AST 1,004* 854*  ALT 1,183* 976*  ALKPHOS 443* 395*  BILITOT 10.2* 12.2*  PROT 6.0* 5.5*  ALBUMIN 2.7* 2.2*   Recent Labs  Lab 09/01/18 2035 09/03/18 0934  LIPASE 67* 53*   Recent Labs  Lab 09/02/18 0335  AMMONIA 85*   Coagulation Profile: Recent Labs  Lab 09/02/18 0417 09/03/18 0228  INR 1.2 1.2   Cardiac Enzymes: No results for input(s): CKTOTAL, CKMB, CKMBINDEX,  TROPONINI in the last 168 hours. BNP (last 3 results) No results for input(s): PROBNP in the last 8760 hours. HbA1C: No results for input(s): HGBA1C in the last 72 hours. CBG: No results for input(s): GLUCAP in the last 168 hours. Lipid Profile: No results for input(s): CHOL, HDL, LDLCALC, TRIG, CHOLHDL, LDLDIRECT in the last 72 hours. Thyroid Function Tests: No results for input(s): TSH, T4TOTAL, FREET4, T3FREE, THYROIDAB in the last 72 hours. Anemia Panel: No results for input(s): VITAMINB12, FOLATE, FERRITIN, TIBC, IRON, RETICCTPCT in the last 72 hours. Sepsis Labs: No results for input(s): PROCALCITON, LATICACIDVEN in the last 168 hours.  Recent Results (from the past 240 hour(s))  SARS CORONAVIRUS 2 Nasal Swab Aptima Multi Swab     Status: None   Collection Time: 09/02/18  4:24 AM   Specimen: Aptima Multi Swab; Nasal Swab  Result Value Ref Range Status   SARS Coronavirus 2 NEGATIVE NEGATIVE Final    Comment: (NOTE) SARS-CoV-2 target nucleic acids are NOT DETECTED. The SARS-CoV-2 RNA is generally detectable in upper and lower respiratory specimens during the acute phase of infection. Negative results do not preclude SARS-CoV-2 infection, do not rule out co-infections with other pathogens, and should not be used as the sole basis for treatment or other patient management decisions. Negative results must be combined with clinical observations, patient history, and epidemiological information. The expected result is Negative. Fact Sheet for Patients: SugarRoll.be Fact Sheet for Healthcare Providers: https://www.woods-mathews.com/ This test is not yet approved or cleared by the [place] FDA and  has been authorized for detection and/or diagnosis of SARS-CoV-2 by FDA under an Emergency Use Authorization (EUA). This EUA will remain  in effect (meaning this test can be used) for the duration of the COVID-19 declaration under Section 56  4(b)(1) of the Act, 21 U.S.C. section 360bbb-3(b)(1), unless the authorization is terminated or revoked sooner. Performed at [facility], [place] [address] [place], [place] [address]     Radiology Studies: Dg Chest 2 View  Result Date: 09/02/2018 CLINICAL DATA:  Chest pain. EXAM: CHEST - 2 VIEW COMPARISON:  Radiographs of July 14, 2013. FINDINGS: The heart size and mediastinal contours are within normal limits. Both lungs are clear. No pneumothorax or pleural effusion is noted. The visualized skeletal structures are unremarkable. IMPRESSION: No active cardiopulmonary disease. Electronically Signed   By: Marijo Conception M.D.   On: 09/02/2018 07:10   US Abdomen Limited  Result Date: 09/02/2018 CLINICAL DATA:  Right upper quadrant pain EXAM: ULTRASOUND ABDOMEN LIMITED RIGHT UPPER QUADRANT COMPARISON:  None. FINDINGS: Gallbladder: Minimally distended  gallbladder which accounts for smooth wall thickening that is accentuated by pericholecystic fat. Negative for stone or focal tenderness. No pericholecystic edema. Common bile duct: Diameter: 3 mm Liver: No focal lesion identified. Within normal limits in parenchymal echogenicity. Portal vein is patent on color Doppler imaging with normal direction of blood flow towards the liver. IMPRESSION: 1. No acute finding. 2. Under distended gallbladder with no evidence of calculus. Electronically Signed   By: Monte Fantasia M.D.   On: 09/02/2018 04:06   Scheduled Meds: . acetaZOLAMIDE  250 mg Oral BID  . escitalopram  20 mg Oral Daily   Continuous Infusions:   LOS: 1 day   Kerney Elbe, DO [facility] PAGER is on [place]  If 7PM-7AM, please contact night-coverage [email or web address] Password [facility] 09/03/2018, 11:15 AM

## 2018-09-03 NOTE — Progress Notes (Signed)
Patient refuse CPAP for the night. No distress or complications noted.

## 2018-09-03 NOTE — Progress Notes (Signed)
Patient refused CPAP for the night  

## 2018-09-04 ENCOUNTER — Other Ambulatory Visit: Payer: Self-pay | Admitting: Nurse Practitioner

## 2018-09-04 DIAGNOSIS — B169 Acute hepatitis B without delta-agent and without hepatic coma: Secondary | ICD-10-CM

## 2018-09-04 DIAGNOSIS — R17 Unspecified jaundice: Secondary | ICD-10-CM

## 2018-09-04 LAB — COMPREHENSIVE METABOLIC PANEL
ALT: 904 U/L — ABNORMAL HIGH (ref 0–44)
AST: 842 U/L — ABNORMAL HIGH (ref 15–41)
Albumin: 2.2 g/dL — ABNORMAL LOW (ref 3.5–5.0)
Alkaline Phosphatase: 379 U/L — ABNORMAL HIGH (ref 38–126)
Anion gap: 9 (ref 5–15)
BUN: 8 mg/dL (ref 6–20)
CO2: 19 mmol/L — ABNORMAL LOW (ref 22–32)
Calcium: 8.1 mg/dL — ABNORMAL LOW (ref 8.9–10.3)
Chloride: 110 mmol/L (ref 98–111)
Creatinine, Ser: 0.62 mg/dL (ref 0.44–1.00)
GFR calc Af Amer: >60 mL/min (ref >60)
GFR calc non Af Amer: >60 mL/min (ref >60)
Glucose, Bld: 97 mg/dL (ref 70–99)
Potassium: 3.4 mmol/L — ABNORMAL LOW (ref 3.5–5.1)
Sodium: 138 mmol/L (ref 135–145)
Total Bilirubin: 13.3 mg/dL — ABNORMAL HIGH (ref 0.3–1.2)
Total Protein: 5.8 g/dL — ABNORMAL LOW (ref 6.5–8.1)

## 2018-09-04 LAB — IRON AND TIBC
Iron: 154 ug/dL (ref 28–170)
Saturation Ratios: 51 % — ABNORMAL HIGH (ref 10.4–31.8)
TIBC: 302 ug/dL (ref 250–450)
UIBC: 148 ug/dL

## 2018-09-04 LAB — CBC WITH DIFFERENTIAL/PLATELET
Abs Immature Granulocytes: 0.03 10*3/uL (ref 0.00–0.07)
Basophils Absolute: 0.1 10*3/uL (ref 0.0–0.1)
Basophils Relative: 1 %
Eosinophils Absolute: 0.1 10*3/uL (ref 0.0–0.5)
Eosinophils Relative: 2 %
HCT: 37.3 % (ref 36.0–46.0)
Hemoglobin: 12.2 g/dL (ref 12.0–15.0)
Immature Granulocytes: 1 %
Lymphocytes Relative: 24 %
Lymphs Abs: 1.3 10*3/uL (ref 0.7–4.0)
MCH: 30.5 pg (ref 26.0–34.0)
MCHC: 32.7 g/dL (ref 30.0–36.0)
MCV: 93.3 fL (ref 80.0–100.0)
Monocytes Absolute: 1 10*3/uL (ref 0.1–1.0)
Monocytes Relative: 18 %
Neutro Abs: 3 10*3/uL (ref 1.7–7.7)
Neutrophils Relative %: 54 %
Platelets: 176 10*3/uL (ref 150–400)
RBC: 4 MIL/uL (ref 3.87–5.11)
RDW: 16.1 % — ABNORMAL HIGH (ref 11.5–15.5)
WBC: 5.4 10*3/uL (ref 4.0–10.5)
nRBC: 0 % (ref 0.0–0.2)

## 2018-09-04 LAB — RETICULOCYTES
Immature Retic Fract: 12.7 % (ref 2.3–15.9)
RBC.: 4 MIL/uL (ref 3.87–5.11)
Retic Count, Absolute: 135.2 10*3/uL (ref 19.0–186.0)
Retic Ct Pct: 3.4 % — ABNORMAL HIGH (ref 0.4–3.1)

## 2018-09-04 LAB — ANTINUCLEAR ANTIBODIES, IFA: ANA Ab, IFA: NEGATIVE

## 2018-09-04 LAB — MAGNESIUM: Magnesium: 1.9 mg/dL (ref 1.7–2.4)

## 2018-09-04 LAB — PHOSPHORUS: Phosphorus: 4.1 mg/dL (ref 2.5–4.6)

## 2018-09-04 LAB — MITOCHONDRIAL ANTIBODIES: Mitochondrial M2 Ab, IgG: <20 Units (ref 0.0–20.0)

## 2018-09-04 LAB — VITAMIN B12: Vitamin B-12: 2640 pg/mL — ABNORMAL HIGH (ref 180–914)

## 2018-09-04 LAB — HEPATITIS B E ANTIBODY: Hep B E Ab: NEGATIVE

## 2018-09-04 LAB — ANTI-SMOOTH MUSCLE ANTIBODY, IGG: F-Actin IgG: 11 Units (ref 0–19)

## 2018-09-04 LAB — FERRITIN: Ferritin: 1306 ng/mL — ABNORMAL HIGH (ref 11–307)

## 2018-09-04 LAB — FOLATE: Folate: 18.8 ng/mL (ref >5.9)

## 2018-09-04 LAB — HEPATITIS B E ANTIGEN: Hep B E Ag: POSITIVE — AB

## 2018-09-04 MED ORDER — POTASSIUM CHLORIDE CRYS ER 20 MEQ PO TBCR
40.0000 meq | EXTENDED_RELEASE_TABLET | Freq: Two times a day (BID) | ORAL | Status: DC
  Administered 2018-09-04: 40 meq via ORAL
  Filled 2018-09-04: qty 2

## 2018-09-04 MED ORDER — OXYCODONE HCL 5 MG PO TABS: 5.0000 mg | ORAL_TABLET | Freq: Four times a day (QID) | ORAL | 0 refills | Status: DC | PRN

## 2018-09-04 NOTE — Plan of Care (Signed)

## 2018-09-04 NOTE — Discharge Summary (Signed)
Physician Discharge Summary  EDY MCBANE DGU:[id number] DOB: [birth year] DOA: 09/01/2018  PCP: Hoyt Koch, MD  Admit date: 09/01/2018 Discharge date: 09/04/2018  Admitted From: Home Disposition: Home  Recommendations for Outpatient Follow-up:  1. Follow up with PCP in 1-2 weeks and repeat lab work within 1 week 2. Discussed with PCP about PC SK 9 inhibitors 3. Follow up with Gastroenterology within 1-2 weeks and repeat CMP  4. Please obtain BMP/CBC in one week 5. 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  She was admitted for an acute liver injury and abnormal LFTs with hyperbilirubinemia.  Gastroenterology was consulted and they are recommending ruling out acute viral hepatitis with serologies and ruling out AIH as well as following liver functions closely.  Patient has no evidence of gallbladder or biliary disorder on ultrasound.  LFTs are trending down but bili started trending up but GI recommended discharging home and following up as an outpatient within 1 week.  T bili was expected to go up and patient's abdominal was much improved compared to when she came in.  She denies any nausea or vomiting.  She is deemed stable for discharge will need to follow-up with PCP as well as gastroenterology in outpatient setting and follow-up on her pending hepatitis labs.  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 AST 1004, ALT 1183, alk phos 443, and T bili 10.2.  -LFTs were normal in January 2019.  -Acetaminophen level negative and less than 10. Ammonia 85,no confusion or somnolence.  -INR 1.2.  -Right upper quadrant ultrasound negative for acute finding. Under distended gallbladder with no evidence of calculus. CBD diameter 3 mm.  -? Suspected possible choledocholithiasis given cholestatic pattern seen on labs but more like a Viral Hepatitis .  -No fever or leukocytosis to suggest acute cholangitis.  -IVF now stopped and Diet advanced -Consult GI and they have canceled MRCP/ERCP. -Patient's alk phos is now 79, lipase level is trending down to 53, AST is now 842, ALT is now 904, and total bilirubin is 13.3 with direct of 7.6 and indirect of 4.6 -Acute hepatitis panel done and showed negative hep A antibody IgM, however hepatitis B surface antigen was positive and hepatitis B core antibody IgM was positive -HCV antibody was negative at less than 0.1 -Further care per GI and evaluation recommendations -Hep B E antigen was also positive and had BV quantitative is still pending as well as hep B -Continue to monitor and trend LFTs and trend LFTs and repeat CMP within 1 week -Continued with IV hydromorphone 2.5 mg every 3 hours PRN for severe pain for abdominal pain along with oxycodone 5 mg every 6 pain for moderate pain while hospitalized and  will write for some oxycodone at discharge and recommended avoiding Tylenol. -Patient's ANA antibody was negative and mitochondrial M2 antibody IgG was less than 20.0 -GI recommended discharging home and she will need to follow-up with PCP and gastroenterology in outpatient setting  and repeat labs within 1 week  Hypokalemia, improved -Potassium 3.3 on admission and this morning was 3.4.Likely related to home diuretic use. -Replete potassium prior to discharge -Continue to Monitor and Replete as Necessary -Repeat CMP as an outpatient  Normal Anion Gap Metabolic Acidosis -Bicarb 16, anion gap 9 on Admisison.Likely secondary to home diuretic use. -IV fluid hydration now stoped -Bicarbonate is now 19 and AG is 9 -Resumed Home Acetazolamide now  will need close laboratory monitoring -Continue to monitor and repeat CMP in AM   Dyspnea, improved -Patient reports history of shortness of breath, unclear how long.  -Not tachypneic or hypoxic on exam. Bibasilar rales (left greater than right) appreciated on auscultation. -Chest x-ray showed "The heart size and mediastinal contours are within normal limits. Both lungs are clear. No pneumothorax or pleural effusion is noted. The visualized skeletal structures are unremarkable." -Was not dyspneic on examination  OSA -C/w CPAP at night  Depression and Anxiety  -Continue home Escitalopram   Normocytic Anemia -Patient's hemoglobin/hematocrit went from 13.0/40.6 is now 12.2/37.3 -Checked anemia panel and iron level of 154, U IBC is 148, TIBC is 302, saturation ratio was 51%, ferritin level was 1306, folate level was 8.8, and vitamin B12 level was 2640 -Continue to monitor for signs and symptoms of bleeding; currently no overt bleeding noted -Repeat CBC as an outpatient  Hyperlipidemia -Had significant amount of myalgias and no longer taking atorvastatin 20 g p.o. nightly along with fenofibrate 100 symptoms p.o. daily -May be a candidate for PC KS 9 inhibitor and will defer to outpatient  History of Ocular Stroke -Continue aspirin 325 mg p.o. daily and statin has now been stopped due to significant myalgias  Pseudotumor Cerebri -Continue with acetazolamide 250 mg p.o. twice daily along with potassium  chloride 10 mg p.o. daily  Discharge Instructions  Discharge Instructions    Call MD for:  difficulty breathing, headache or visual disturbances   Complete by: As directed    Call MD for:  extreme fatigue   Complete by: As directed    Call MD for:  hives   Complete by: As directed    Call MD for:  persistant dizziness or light-headedness   Complete by: As directed    Call MD for:  persistant nausea and vomiting   Complete by: As directed    Call MD for:  redness, tenderness, or signs of infection (pain, swelling, redness, odor or green/yellow discharge around incision site)   Complete by: As directed    Call MD for:  severe uncontrolled pain   Complete by: As directed    Call MD for:  temperature >100.4   Complete by: As directed    Diet - low sodium heart healthy   Complete by: As directed    Discharge instructions   Complete by: As directed    You were cared for by a hospitalist during your hospital stay. If you have any questions about your discharge medications or the care you received while you were in the hospital after you are discharged, you can call the unit and ask to speak with the hospitalist on call if the hospitalist that took care of you is not available. Once you are discharged, your primary care physician will handle  any further medical issues. Please note that NO REFILLS for any discharge medications will be authorized once you are discharged, as it is imperative that you return to your primary care physician (or establish a relationship with a primary care physician if you do not have one) for your aftercare needs so that they can reassess your need for medications and monitor your lab values.  Follow up with PCP and Gastroenterology. Take all medications as prescribed. If symptoms change or worsen please return to the ED for evaluation   Increase activity slowly   Complete by: As directed      Allergies as of 09/04/2018   No Known Allergies     Medication List     STOP taking these medications   acetaminophen 500 MG tablet Commonly known as: TYLENOL   atorvastatin 20 MG tablet Commonly known as: LIPITOR   fenofibrate 160 MG tablet   valACYclovir 1000 MG tablet Commonly known as: VALTREX     TAKE these medications   acetaZOLAMIDE 250 MG tablet Commonly known as: DIAMOX TAKE 1 TABLET BY MOUTH 2 (TWO) TIMES DAILY. NEED ANNUAL APPOINTMENT FOR FURTHER REFILLS What changed: See the new instructions.   aspirin EC 325 MG tablet Take 1 tablet (325 mg total) by mouth daily.   escitalopram 20 MG tablet Commonly known as: Lexapro Take 1 tablet (20 mg total) by mouth daily.   Klor-Con M10 10 MEQ tablet Generic drug: potassium chloride TAKE 1 TABLET BY MOUTH EVERY DAY *NEEDS APPT* What changed: See the new instructions.   oxyCODONE 5 MG immediate release tablet Commonly known as: Oxy IR/ROXICODONE Take 1 tablet (5 mg total) by mouth every 6 (six) hours as needed for moderate pain.       No Known Allergies  Consultations:  Gastroenterology  Procedures/Studies: Dg Chest 2 View  Result Date: 09/02/2018 CLINICAL DATA:  Chest pain. EXAM: CHEST - 2 VIEW COMPARISON:  Radiographs of July 14, 2013. FINDINGS: The heart size and mediastinal contours are within normal limits. Both lungs are clear. No pneumothorax or pleural effusion is noted. The visualized skeletal structures are unremarkable. IMPRESSION: No active cardiopulmonary disease. Electronically Signed   By: Marijo Conception M.D.   On: 09/02/2018 07:10   US Abdomen Limited  Result Date: 09/02/2018 CLINICAL DATA:  Right upper quadrant pain EXAM: ULTRASOUND ABDOMEN LIMITED RIGHT UPPER QUADRANT COMPARISON:  None. FINDINGS: Gallbladder: Minimally distended gallbladder which accounts for smooth wall thickening that is accentuated by pericholecystic fat. Negative for stone or focal tenderness. No pericholecystic edema. Common bile duct: Diameter: 3 mm Liver: No focal lesion identified. Within  normal limits in parenchymal echogenicity. Portal vein is patent on color Doppler imaging with normal direction of blood flow towards the liver. IMPRESSION: 1. No acute finding. 2. Under distended gallbladder with no evidence of calculus. Electronically Signed   By: Monte Fantasia M.D.   On: 09/02/2018 04:06    Subjective: Patient seen and examined at bedside states her abdominal pain is improved.  No nausea or vomiting.  Denies chest pain, answer dizziness.  No other concerns or complaints at this time and ready to go home.  Discharge Exam: Vitals:   09/04/18 0610 09/04/18 1247  BP: 121/78 121/77  Pulse: 62 (!) 57  Resp: 18 15  Temp: 98.7 F (37.1 C) 98.4 F (36.9 C)  SpO2: 95% 97%   Vitals:   09/03/18 1421 09/03/18 2151 09/04/18 0610 09/04/18 1247  BP: 115/78 111/77 121/78 121/77  Pulse: 60 66 62 (!) 57  Resp: _0 Temp: 99.2 F (37.3 C) 98.1 F (36.7 C) 98.7 F (37.1 C) 98.4 F (36.9 C)  TempSrc: Oral Oral Oral Oral  SpO2: 97% 96% 95% 97%  Weight:      Height:       General: Pt is alert, awake, not in acute distress; patient has scleral icterus and is jaundiced Cardiovascular: RRR, S1/S2 +, no rubs, no gallops Respiratory: Diminished bilaterally, no wheezing, no rhonchi Abdominal: Soft, NT, ND, bowel sounds + Extremities: no appreciable or edema, no cyanosis  The results of significant diagnostics from this hospitalization (including imaging, microbiology, ancillary and laboratory) are listed below for reference.    Microbiology: Recent Results (from the past 240 hour(s))  SARS CORONAVIRUS 2 Nasal Swab Aptima Multi Swab     Status: None   Collection Time: 09/02/18  4:24 AM   Specimen: Aptima Multi Swab; Nasal Swab  Result Value Ref Range Status   SARS Coronavirus 2 NEGATIVE NEGATIVE Final    Comment: (NOTE) SARS-CoV-2 target nucleic acids are NOT DETECTED. The SARS-CoV-2 RNA is generally detectable in upper and lower respiratory specimens during the acute  phase of infection. Negative results do not preclude SARS-CoV-2 infection, do not rule out co-infections with other pathogens, and should not be used as the sole basis for treatment or other patient management decisions. Negative results must be combined with clinical observations, patient history, and epidemiological information. The expected result is Negative. Fact Sheet for Patients: SugarRoll.be Fact Sheet for Healthcare Providers: https://www.woods-mathews.com/ This test is not yet approved or cleared by the [place] FDA and  has been authorized for detection and/or diagnosis of SARS-CoV-2 by FDA under an Emergency Use Authorization (EUA). This EUA will remain  in effect (meaning this test can be used) for the duration of the COVID-19 declaration under Section 56 4(b)(1) of the Act, 21 U.S.C. section 360bbb-3(b)(1), unless the authorization is terminated or revoked sooner. Performed at [facility], [place] [address], [place], [place] [address]     Labs: BNP (last 3 results) No results for input(s): BNP in the last 8760 hours. Basic Metabolic Panel: Recent Labs  Lab 09/01/18 2035 09/03/18 0228 09/03/18 0934 09/04/18 0306  NA 135 137  --  138  K 3.3* 3.7  --  3.4*  CL 110 112*  --  110  CO2 16* 19*  --  19*  GLUCOSE 89 86  --  97  BUN 14 11  --  8  CREATININE 0.57 0.57  --  0.62  CALCIUM 8.3* 8.2*  --  8.1*  MG  --   --  1.7 1.9  PHOS  --   --  3.1 4.1   Liver Function Tests: Recent Labs  Lab 09/01/18 2035 09/03/18 0228 09/04/18 0306  AST 1,004* 854* 842*  ALT 1,183* 976* 904*  ALKPHOS 443* 395* 379*  BILITOT 10.2* 12.2* 13.3*  PROT 6.0* 5.5* 5.8*  ALBUMIN 2.7* 2.2* 2.2*   Recent Labs  Lab 09/01/18 2035 09/03/18 0934  LIPASE 67* 53*   Recent Labs  Lab 09/02/18 0335  AMMONIA 85*   CBC: Recent Labs  Lab 09/01/18 2035 09/03/18 0934 09/04/18 0306  WBC 5.9 5.2 5.4  NEUTROABS  --  3.3 3.0  HGB  13.0 11.7* 12.2  HCT 40.6 36.0 37.3  MCV 96.0 92.5 93.3  PLT 217 167 176   Cardiac Enzymes: No results for input(s): CKTOTAL, CKMB, CKMBINDEX, TROPONINI in the last 168 hours. BNP: Invalid input(s): POCBNP  CBG: No results for input(s): GLUCAP in the last 168 hours. D-Dimer No results for input(s): DDIMER in the last 72 hours. Hgb A1c No results for input(s): HGBA1C in the last 72 hours. Lipid Profile No results for input(s): CHOL, HDL, LDLCALC, TRIG, CHOLHDL, LDLDIRECT in the last 72 hours. Thyroid function studies No results for input(s): TSH, T4TOTAL, T3FREE, THYROIDAB in the last 72 hours.  Invalid input(s): FREET3 Anemia work up Recent Labs    09/04/18 0306  VITAMINB12 2,640*  FOLATE 18.8  FERRITIN 1,306*  TIBC 302  IRON 154  RETICCTPCT 3.4*   Urinalysis    Component Value Date/Time   COLORURINE AMBER (A) 09/01/2018 2038   APPEARANCEUR CLEAR 09/01/2018 2038   LABSPEC 1.010 09/01/2018 2038   PHURINE 6.0 09/01/2018 2038   GLUCOSEU NEGATIVE 09/01/2018 2038   HGBUR MODERATE (A) 09/01/2018 2038   BILIRUBINUR SMALL (A) 09/01/2018 2038   KETONESUR NEGATIVE 09/01/2018 2038   PROTEINUR NEGATIVE 09/01/2018 2038   UROBILINOGEN 0.2 09/17/2013 1509   NITRITE NEGATIVE 09/01/2018 2038   LEUKOCYTESUR NEGATIVE 09/01/2018 2038   Sepsis Labs Invalid input(s): PROCALCITONIN,  WBC,  LACTICIDVEN Microbiology Recent Results (from the past 240 hour(s))  SARS CORONAVIRUS 2 Nasal Swab Aptima Multi Swab     Status: None   Collection Time: 09/02/18  4:24 AM   Specimen: Aptima Multi Swab; Nasal Swab  Result Value Ref Range Status   SARS Coronavirus 2 NEGATIVE NEGATIVE Final    Comment: (NOTE) SARS-CoV-2 target nucleic acids are NOT DETECTED. The SARS-CoV-2 RNA is generally detectable in upper and lower respiratory specimens during the acute phase of infection. Negative results do not preclude SARS-CoV-2 infection, do not rule out co-infections with other pathogens, and should not  be used as the sole basis for treatment or other patient management decisions. Negative results must be combined with clinical observations, patient history, and epidemiological information. The expected result is Negative. Fact Sheet for Patients: SugarRoll.be Fact Sheet for Healthcare Providers: https://www.woods-mathews.com/ This test is not yet approved or cleared by the [place] FDA and  has been authorized for detection and/or diagnosis of SARS-CoV-2 by FDA under an Emergency Use Authorization (EUA). This EUA will remain  in effect (meaning this test can be used) for the duration of the COVID-19 declaration under Section 56 4(b)(1) of the Act, 21 U.S.C. section 360bbb-3(b)(1), unless the authorization is terminated or revoked sooner. Performed at [facility], [place] [address] [place], [place] [address]    Time coordinating discharge: 35 minutes  SIGNED:  Kerney Elbe, DO [facility] 09/04/2018, 7:24 PM Pager is on [place]  If 7PM-7AM, please contact night-coverage [email or web address] Password [email or web address]

## 2018-09-04 NOTE — Progress Notes (Signed)
     Progress Note    ASSESSMENT AND PLAN:   58 yo female with acute HBV. Only known risk factor for infection is recent scratch by a nursing home resident.  -Hep BEag is positive.  -Not co-infected with HIV and HCV ab negative.  -Hep D pending.  -Liver tests slowly improving except for bilirubin (expected).  Normal mentation, normal glucose, INR has been stable.  -elevated ferritin - acute phase reactant however her iron sat is elevated at 51%. Liver tests historically normal.  -stable for discharge from GI standpoint -the risk of transmission to other through body fluids was again discussed with the patient this am.  -follow up liver tests at our [place] office (basement) in one week    SUBJECTIVE    Feels better than when admitted. Still some RUQ discomfort.    OBJECTIVE:     Vital signs in last 24 hours: Temp:  [98.1 F (36.7 C)-99.2 F (37.3 C)] 98.7 F (37.1 C) (08/13 0610) Pulse Rate:  [60-66] 62 (08/13 0610) Resp:  [18] 18 (08/13 0610) BP: (111-121)/(77-78) 121/78 (08/13 0610) SpO2:  [95 %-97 %] 95 % (08/13 0610) Last BM Date: 09/04/18 General:   Alert, well-developed female in NAD,  Heart:  Regular rate and rhythm;  No lower extremity edema   Pulm: Normal respiratory effort, lungs Abdomen:  Soft, nondistended, mild RUQ tenderness. Normal bowel sounds.    Neurologic:  Alert and  oriented x4;  grossly normal neurologically. Psych:  Pleasant, cooperative.  Normal mood and affect.   Intake/Output from previous day: 08/12 0701 - 08/13 0700 In: 600 [P.O.:600] Out: 550 [Urine:550] Intake/Output this shift: Total I/O In: 480 [P.O.:480] Out: 480 [Urine:480]  Lab Results: Recent Labs    09/01/18 2035 09/03/18 0934 09/04/18 0306  WBC 5.9 5.2 5.4  HGB 13.0 11.7* 12.2  HCT 40.6 36.0 37.3  PLT 217 167 176   BMET Recent Labs    09/01/18 2035 09/03/18 0228 09/04/18 0306  NA 135 137 138  K 3.3* 3.7 3.4*  CL 110 112* 110  CO2 16* 19* 19*  GLUCOSE 89 86  97  BUN 14 11 8   CREATININE 0.57 0.57 0.62  CALCIUM 8.3* 8.2* 8.1*   LFT Recent Labs    09/03/18 0228 09/04/18 0306  PROT 5.5* 5.8*  ALBUMIN 2.2* 2.2*  AST 854* 842*  ALT 976* 904*  ALKPHOS 395* 379*  BILITOT 12.2* 13.3*  BILIDIR 7.6*  --   IBILI 4.6*  --    PT/INR Recent Labs    09/02/18 0417 09/03/18 0228  LABPROT 14.8 15.0  INR 1.2 1.2   Hepatitis Panel Recent Labs    09/02/18 0417  HEPBSAG Positive*  HCVAB <0.1  HEPAIGM Negative  HEPBIGM Positive*    Principal Problem:   Elevated LFTs Active Problems:   OSA on CPAP   Hypokalemia   Metabolic acidosis   Dyspnea   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

## 2018-09-05 ENCOUNTER — Telehealth: Payer: Self-pay | Admitting: *Deleted

## 2018-09-05 ENCOUNTER — Ambulatory Visit: Payer: BC Managed Care – PPO | Admitting: Internal Medicine

## 2018-09-05 DIAGNOSIS — B169 Acute hepatitis B without delta-agent and without hepatic coma: Secondary | ICD-10-CM

## 2018-09-05 LAB — IGG: IgG (Immunoglobin G), Serum: 1199 mg/dL (ref 586–1602)

## 2018-09-05 MED ORDER — HYOSCYAMINE SULFATE 0.125 MG PO TBDP: 0.1250 mg | ORAL_TABLET | ORAL | 0 refills | Status: DC | PRN

## 2018-09-05 NOTE — Telephone Encounter (Signed)
Called pt to inform of below- no answer/unable to leave vm.

## 2018-09-05 NOTE — Telephone Encounter (Signed)
Transition Care Management Follow-up Telephone Call  How have you been since you were released from the hospital? "i'm better, but still in pain". My stomach is still full filling."   Do you understand why you were in the hospital?  Yes  Do you understand the discharge instrcutions? Yes Items Reviewed:  Medications reviewed: Yes  Allergies reviewed: Yes  Dietary changes reviewed:  Yes Referrals reviewed:    Functional Questionnaire:   Activities of Daily Living (ADLs):   She states they are independent in the following: ALL States they require assistance with the following: None, but her husband is helping with cooking and meals.    Any transportation issues/concerns?: No     Any patient concerns? She is concerned she will have to live with this forever.    Confirmed importance and date/time of follow-up visits scheduled: Yes, she is scheduled with PCP 09/15/18 10:20.     Confirmed with patient if condition begins to worsen call PCP or go to the ER.  Patient was given the Call-a-Nurse line [phone]:

## 2018-09-05 NOTE — Telephone Encounter (Signed)
Can use gas-x for bloating. This will take 4-6 weeks to resolve and should do so. Some people do have chronic hepatitis B and we will not know if she will have this for potentially 6 months. Have sent in hyoscyamine to see if this helps with abdominal pain. The oxycodone will likely be giving her more nausea and bloating and may worsen her symptoms rather than helping. The jaundice (yellow color) will resolve over the next 4-6 weeks. Put in labs to be done Tuesday/Wednesday next week and can keep visit as scheduled.

## 2018-09-05 NOTE — Telephone Encounter (Signed)
Tried calling patient no answer and voicemail full

## 2018-09-05 NOTE — Telephone Encounter (Signed)
Patient is scheduled with PCP for hosp f/u 09/15/18. She is still c/o severe abdominal discomfort/bloating. She is concerned about her lab levels and that she may have to live with this forever. I informed pt 09/15/18 is the soonest available opening with PCP. She is requesting something to help her have some relief. Please advise.

## 2018-09-08 NOTE — Telephone Encounter (Signed)
Called patient and informed of MD response patient stated understanding. Stated that she is unable to make it to the office this week she has no way to get here. Patient states she only has a way to get here next Monday.

## 2018-09-10 LAB — HBV QUANT PCR RFX TO GENOTYPE
HBV IU/mL: 4410000 IU/mL
log10 HBV as IU/mL: 6.644 log10 IU/mL

## 2018-09-10 LAB — HBV GENOTYPE (REFLEXED)

## 2018-09-15 ENCOUNTER — Ambulatory Visit (INDEPENDENT_AMBULATORY_CARE_PROVIDER_SITE_OTHER): Payer: BC Managed Care – PPO | Admitting: Internal Medicine

## 2018-09-15 ENCOUNTER — Other Ambulatory Visit: Payer: Self-pay

## 2018-09-15 ENCOUNTER — Encounter: Payer: Self-pay | Admitting: Internal Medicine

## 2018-09-15 ENCOUNTER — Other Ambulatory Visit (INDEPENDENT_AMBULATORY_CARE_PROVIDER_SITE_OTHER): Payer: BC Managed Care – PPO

## 2018-09-15 VITALS — BP 100/70 | HR 83 | Temp 98.5°F | Ht 58.5 in | Wt 144.0 lb

## 2018-09-15 DIAGNOSIS — B169 Acute hepatitis B without delta-agent and without hepatic coma: Secondary | ICD-10-CM

## 2018-09-15 DIAGNOSIS — Z23 Encounter for immunization: Secondary | ICD-10-CM | POA: Diagnosis not present

## 2018-09-15 DIAGNOSIS — M2550 Pain in unspecified joint: Secondary | ICD-10-CM | POA: Diagnosis not present

## 2018-09-15 LAB — HEPATIC FUNCTION PANEL
ALT: 743 U/L — ABNORMAL HIGH (ref 0–35)
AST: 1150 U/L — ABNORMAL HIGH (ref 0–37)
Albumin: 3 g/dL — ABNORMAL LOW (ref 3.5–5.2)
Alkaline Phosphatase: 329 U/L — ABNORMAL HIGH (ref 39–117)
Bilirubin, Direct: 16.8 mg/dL — ABNORMAL HIGH (ref 0.0–0.3)
Total Bilirubin: 22.6 mg/dL — ABNORMAL HIGH (ref 0.2–1.2)
Total Protein: 5.8 g/dL — ABNORMAL LOW (ref 6.0–8.3)

## 2018-09-15 LAB — COMPREHENSIVE METABOLIC PANEL
ALT: 743 U/L — ABNORMAL HIGH (ref 0–35)
AST: 1150 U/L — ABNORMAL HIGH (ref 0–37)
Albumin: 3 g/dL — ABNORMAL LOW (ref 3.5–5.2)
Alkaline Phosphatase: 329 U/L — ABNORMAL HIGH (ref 39–117)
BUN: 13 mg/dL (ref 6–23)
CO2: 18 mEq/L — ABNORMAL LOW (ref 19–32)
Calcium: 8.2 mg/dL — ABNORMAL LOW (ref 8.4–10.5)
Chloride: 108 mEq/L (ref 96–112)
Creatinine, Ser: 0.78 mg/dL (ref 0.40–1.20)
GFR: 75.89 mL/min (ref >60.00)
Glucose, Bld: 85 mg/dL (ref 70–99)
Potassium: 2.9 mEq/L — ABNORMAL LOW (ref 3.5–5.1)
Sodium: 134 mEq/L — ABNORMAL LOW (ref 135–145)
Total Bilirubin: 22.6 mg/dL — ABNORMAL HIGH (ref 0.2–1.2)
Total Protein: 5.8 g/dL — ABNORMAL LOW (ref 6.0–8.3)

## 2018-09-15 LAB — CBC
HCT: 40.3 % (ref 36.0–46.0)
Hemoglobin: 13.7 g/dL (ref 12.0–15.0)
MCHC: 33.9 g/dL (ref 30.0–36.0)
MCV: 91.1 fl (ref 78.0–100.0)
Platelets: 160 10*3/uL (ref 150.0–400.0)
RBC: 4.42 Mil/uL (ref 3.87–5.11)
RDW: 17.4 % — ABNORMAL HIGH (ref 11.5–15.5)
WBC: 6.2 10*3/uL (ref 4.0–10.5)

## 2018-09-15 LAB — CK: Total CK: 18 U/L (ref 7–177)

## 2018-09-15 LAB — LIPID PANEL
Cholesterol: 231 mg/dL — ABNORMAL HIGH (ref 0–200)
HDL: 5.6 mg/dL — ABNORMAL LOW (ref >39.00)
Total CHOL/HDL Ratio: 41
Triglycerides: 554 mg/dL — ABNORMAL HIGH (ref 0.0–149.0)

## 2018-09-15 LAB — HEMOGLOBIN A1C: Hgb A1c MFr Bld: 4.8 % (ref 4.6–6.5)

## 2018-09-15 LAB — LDL CHOLESTEROL, DIRECT: Direct LDL: 191 mg/dL

## 2018-09-15 LAB — PROTIME-INR
INR: 1.2 ratio — ABNORMAL HIGH (ref 0.8–1.0)
Prothrombin Time: 14.5 s — ABNORMAL HIGH (ref 9.6–13.1)
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
## 2018-09-15 NOTE — Assessment & Plan Note (Signed)
Labs pending from today, per ID she will need visit with them if bilirubin >3 at 4 weeks. Depending on results we discussed different monitoring intervals. Reminded about need for family contacts to get testing. Reminded about course and expectations for disease. Work note given and strongly encouraged to make sure her occupational exposure contact has been tested.

## 2018-09-15 NOTE — Patient Instructions (Signed)
Hepatitis B Hepatitis B is a viral infection of the liver. There are two kinds of hepatitis B:  Acute hepatitis B. This lasts for six months or less.  Chronic hepatitis B. This lasts for more than six months. Chronic hepatitis B can lead to liver failure, scarring of the liver (cirrhosis), or liver cancer. Acute hepatitis B can turn into chronic hepatitis B. Most adults with acute hepatitis B do not develop chronic hepatitis B. Infants and young children who get hepatitis B are more likely to develop chronic hepatitis B than adults. The hepatitis B vaccine can prevent this condition. What are the causes? This condition is caused by the hepatitis B virus (HBV). The virus may spread from person to person (is contagious) through:  Blood.  Childbirth. A woman who has hepatitis B can pass it to her baby during birth.  Bodily fluids such as breast milk, tears, semen, vaginal fluids, and saliva. What increases the risk? The following factors may make you more likely to develop this condition:  Having contact with unclean (contaminated) needles or syringes. This may result from: ? Acupuncture. ? Tattooing. ? Body piercing. ? Injecting drugs.  Having unprotected sex with someone who is infected.  Living with or having close contact with a person who has hepatitis B.  Working in a job that involves contact with blood or bodily fluids, such as health care.  Traveling to a country that has many cases of hepatitis B.  Being on treatment to filter your blood (kidney dialysis).  Having a history of blood transfusions or organ transplants. What are the signs or symptoms? Symptoms of this condition may include:  Loss of appetite.  Fatigue.  Nausea.  Vomiting.  Stomach pain.  Dark yellow urine.  Yellowish skin and eyes (jaundice).  Fever.  Light-colored or gray bowel movements.  Joint pain. In some cases, you may not have any symptoms. How is this diagnosed? This condition is  diagnosed based on:  A physical exam.  Your medical history.  Blood tests. How is this treated? Treatment for chronic hepatitis B may include antiviral medicine. This medicine may help:  Lower your risk of liver failure, cirrhosis, or liver cancer.  Lower your ability to infect others with hepatitis B. You will need to avoid alcohol and medicines that can be hard for the liver to break down (metabolize). This helps prevent further injury to your liver. Follow these instructions at home: Medicines   Take over-the-counter and prescription medicines only as told by your health care provider.  Take your antiviral medicine as told by your health care provider. Do not stop taking the antiviral even if you start to feel better.  Do not take any over-the-counter medicines that contain acetaminophen.  Do not take any new medicines, including over-the-counter medicines for fever or pain, unless approved by your health care provider. Activity  Rest as needed.  Do not have sex unless approved by your health care provider.  Ask your health care provider when you may return to school or work. Eating and drinking  Eat a balanced diet with plenty of fruits and vegetables, whole grains, and lowfat (lean) meats or other non-meat proteins (such as beans or tofu).  Drink enough fluids to keep your urine clear or pale yellow.  Avoid alcohol. General instructions  Do not share toothbrushes, nail clippers, or razors.  Wash your hands frequently with soap and water. If soap and water are not available, use hand sanitizer.  Keep all follow-up visits as   told by your health care provider. This is important. How is this prevented?  Get the hepatitis B vaccine. This helps prevent the hepatitis B infection.  Wash your hands frequently with soap and water. If soap and water are not available, use hand sanitizer.  Do not share needles or syringes.  Practice safe sex and use condoms.  Avoid  handling blood or bodily fluids without gloves or other protection.  Avoid getting tattoos or piercings in shops or other locations that are not clean. Contact a health care provider if:  You develop a rash.  You develop jaundice, or your chronic jaundice becomes more severe.  You have a fever. Get help right away if:  You are unable to eat or drink.  You have a fever along with nausea or vomiting.  You feel confused.  You have trouble breathing.  Your skin, throat, mouth, or face becomes swollen.  You have jerky movements that you cannot control (seizure).  You become very sleepy or have trouble waking up.  Your stomach becomes very swollen. Summary  Hepatitis B is a viral infection of the liver. There are two kinds of hepatitis B: acute and chronic.  The hepatitis B virus (HBV) can be passed from person to person (is contagious).  You should not take any new medicines, including over-the-counter medicines for fever or pain, unless approved by your health care provider.  To help prevent hepatitis B, wash your hands frequently with soap and water. If soap and water are not available, use hand sanitizer. This information is not intended to replace advice given to you by your health care provider. Make sure you discuss any questions you have with your health care provider. Document Released: 01/06/2000 Document Revised: 12/21/2016 Document Reviewed: 02/14/2016 Elsevier Patient Education  2020 Elsevier Inc.  

## 2018-09-16 ENCOUNTER — Other Ambulatory Visit: Payer: Self-pay | Admitting: Internal Medicine

## 2018-09-16 DIAGNOSIS — B169 Acute hepatitis B without delta-agent and without hepatic coma: Secondary | ICD-10-CM

## 2018-09-16 LAB — ANA, IFA COMPREHENSIVE PANEL
Anti Nuclear Antibody (ANA): NEGATIVE
ENA SM Ab Ser-aCnc: <1 AI
SM/RNP: <1 AI
SSA (Ro) (ENA) Antibody, IgG: <1 AI
SSB (La) (ENA) Antibody, IgG: <1 AI
Scleroderma (Scl-70) (ENA) Antibody, IgG: <1 AI
ds DNA Ab: <1 IU/mL

## 2018-09-16 LAB — TSH: TSH: 1.49 u[IU]/mL (ref 0.35–4.50)

## 2018-09-19 ENCOUNTER — Other Ambulatory Visit: Payer: BC Managed Care – PPO

## 2018-09-19 ENCOUNTER — Other Ambulatory Visit (INDEPENDENT_AMBULATORY_CARE_PROVIDER_SITE_OTHER): Payer: BC Managed Care – PPO

## 2018-09-19 ENCOUNTER — Other Ambulatory Visit: Payer: Self-pay | Admitting: Internal Medicine

## 2018-09-19 DIAGNOSIS — B169 Acute hepatitis B without delta-agent and without hepatic coma: Secondary | ICD-10-CM

## 2018-09-19 LAB — HEPATIC FUNCTION PANEL
ALT: 665 U/L — ABNORMAL HIGH (ref 0–35)
AST: 979 U/L — ABNORMAL HIGH (ref 0–37)
Albumin: 2.8 g/dL — ABNORMAL LOW (ref 3.5–5.2)
Alkaline Phosphatase: 347 U/L — ABNORMAL HIGH (ref 39–117)
Bilirubin, Direct: 15.2 mg/dL — ABNORMAL HIGH (ref 0.0–0.3)
Total Bilirubin: 20.7 mg/dL — ABNORMAL HIGH (ref 0.2–1.2)
Total Protein: 5.6 g/dL — ABNORMAL LOW (ref 6.0–8.3)

## 2018-09-19 LAB — PROTIME-INR
INR: 1.2 ratio — ABNORMAL HIGH (ref 0.8–1.0)
Prothrombin Time: 13.8 s — ABNORMAL HIGH (ref 9.6–13.1)

## 2018-09-22 LAB — HEPATITIS A ANTIBODY, TOTAL: Hepatitis A AB,Total: NONREACTIVE

## 2018-09-23 ENCOUNTER — Telehealth: Payer: Self-pay | Admitting: Gastroenterology

## 2018-09-23 ENCOUNTER — Ambulatory Visit: Payer: BC Managed Care – PPO | Admitting: Family

## 2018-09-23 ENCOUNTER — Other Ambulatory Visit: Payer: Self-pay

## 2018-09-23 ENCOUNTER — Encounter: Payer: Self-pay | Admitting: Family

## 2018-09-23 VITALS — BP 102/66 | HR 54 | Temp 98.4°F

## 2018-09-23 DIAGNOSIS — B169 Acute hepatitis B without delta-agent and without hepatic coma: Secondary | ICD-10-CM | POA: Diagnosis not present

## 2018-09-23 NOTE — Patient Instructions (Signed)
Nice to meet you.  We will check your liver function tests today.  Everything is moving in the right direction and no medication is necessary at the present time - this may change if the numbers are going up or your symptoms are not gradually improving.   Please plan for follow up in 3 months or sooner if needed.  Have a great day and stay safe!

## 2018-09-23 NOTE — Assessment & Plan Note (Addendum)
Stephanie Booker has acute Hepatitis B with positive E antigen and core IgM although cannot rule out possibility of previous infection with flare. AST/ALT/Bilirubin are all slowly improving on most recent blood work. Will recheck hepatic panel and CBC today. We discussed the pathogenesis, transmission, and treatments for Hepatitis B. Pending the blood work results, it appears reasonable to hold on antiviral therapy at present given the most recent improvements as she has no cirrhosis noted on her ultrasound. If this is new Hepatitis B would suspect she should be able to clear the virus without medication. Will plan to recheck in 3 months or sooner if needed unless symptoms do not improve or worsen.

## 2018-09-23 NOTE — Progress Notes (Signed)
Subjective:    Patient ID: Stephanie Booker, female    DOB: 06/01/1960, 58 y.o.   MRN: [id number]  Chief Complaint  Patient presents with  . Hepatitis B    HPI:  Stephanie Booker is a 58 y.o. female with previous medical history of ocular stroke, sleep apnea, anxiety and hyperlipidemia recently admitted to the hospital from 8/10-8/13 admitted with abdominal pain and fullness. She was noted to have abnormal LFTs and hyperbilirubinemia. There was no evidence of gall bladder or biliary disease on ultrasound. Lab work was positive for acute Hepatitis B with positive Hepatitis B Core IgM and e Antigen. AST peaked at 1150 and ALT at 743 on 8/24 improving to 979 and 665 respectively on 8/28. Billirubin improved from 22.6 on 8/24 down to 20.7 on 8/28.  Abdominal pain gradually improved during hospitalization without medication.   Continues to have generalized abdominal pain and fatigue. Severity is the worst pain that she has had. Appetite has been decreased and has been having difficulties with sleeping since leaving this hospital.     No Known Allergies    Outpatient Medications Prior to Visit  Medication Sig Dispense Refill  . aspirin EC 325 MG tablet Take 1 tablet (325 mg total) by mouth daily. 30 tablet 0  . escitalopram (LEXAPRO) 20 MG tablet Take 1 tablet (20 mg total) by mouth daily. 90 tablet 3  . hyoscyamine (ANASPAZ) 0.125 MG TBDP disintergrating tablet Place 1 tablet (0.125 mg total) under the tongue every 4 (four) hours as needed. 30 tablet 0  . KLOR-CON M10 10 MEQ tablet TAKE 1 TABLET BY MOUTH EVERY DAY *NEEDS APPT* (Patient taking differently: Take 10 mEq by mouth daily. ) 90 tablet 0  . acetaZOLAMIDE (DIAMOX) 250 MG tablet TAKE 1 TABLET BY MOUTH 2 (TWO) TIMES DAILY. NEED ANNUAL APPOINTMENT FOR FURTHER REFILLS (Patient not taking: No sig reported) 180 tablet 0   No facility-administered medications prior to visit.      Past Medical History:  Diagnosis Date  . Anxiety   .  Hyperlipidemia   . OSA (obstructive sleep apnea)   . Stroke [facility](HCC)    ocular stroke      Past Surgical History:  Procedure Laterality Date  . ABDOMINAL HYSTERECTOMY        Family History  Problem Relation Age of Onset  . Breast cancer Mother       Social History   Socioeconomic History  . Marital status: Married    Spouse name: Not on file  . Number of children: 0  . Years of education: 12th  . Highest education level: Not on file  Occupational History  . Occupation: cna    Employer: [employer]  Social Needs  . Financial resource strain: Not on file  . Food insecurity    Worry: Not on file    Inability: Not on file  . Transportation needs    Medical: Not on file    Non-medical: Not on file  Tobacco Use  . Smoking status: Current Every Day Smoker    Packs/day: 1.00    Start date: 01/23/1975  . Smokeless tobacco: Never Used  Substance and Sexual Activity  . Alcohol use: Yes    Alcohol/week: 0.0 standard drinks    Comment: occassionally  . Drug use: No  . Sexual activity: Yes  Lifestyle  . Physical activity    Days per week: Not on file    Minutes per session: Not on file  . Stress: Not on  file  Relationships  . Social Herbalist on phone: Not on file    Gets together: Not on file    Attends religious service: Not on file    Active member of club or organization: Not on file    Attends meetings of clubs or organizations: Not on file    Relationship status: Not on file  . Intimate partner violence    Fear of current or ex partner: Not on file    Emotionally abused: Not on file    Physically abused: Not on file    Forced sexual activity: Not on file  Other Topics Concern  . Not on file  Social History Narrative   Patient lives at home with her husband    Patient is right handed   Patient coffee daily      Review of Systems  Constitutional: Negative for chills, fatigue, fever and unexpected weight change.  Respiratory: Negative for  cough, chest tightness, shortness of breath and wheezing.   Cardiovascular: Negative for chest pain and leg swelling.  Gastrointestinal: Negative for abdominal distention, constipation, diarrhea, nausea and vomiting.  Neurological: Negative for dizziness, weakness, light-headedness and headaches.  Hematological: Does not bruise/bleed easily.       Objective:    BP 102/66   Pulse (!) 54   Temp 98.4 F (36.9 C)  Nursing note and vital signs reviewed.  Physical Exam Constitutional:      General: She is not in acute distress.    Appearance: She is well-developed.  Eyes:     General: Scleral icterus present.  Cardiovascular:     Rate and Rhythm: Normal rate and regular rhythm.     Heart sounds: Normal heart sounds. No murmur. No friction rub. No gallop.   Pulmonary:     Effort: Pulmonary effort is normal. No respiratory distress.     Breath sounds: Normal breath sounds. No wheezing or rales.  Chest:     Chest wall: No tenderness.  Abdominal:     General: Bowel sounds are normal. There is distension.     Palpations: Abdomen is soft. There is no mass.     Tenderness: There is abdominal tenderness. There is no guarding or rebound.  Skin:    General: Skin is warm and dry.     Coloration: Skin is jaundiced.  Neurological:     Mental Status: She is alert and oriented to person, place, and time.  Psychiatric:        Behavior: Behavior normal.        Thought Content: Thought content normal.        Judgment: Judgment normal.         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 AST/ALT/Bilirubin are all slowly improving on most recent blood work. Will recheck hepatic panel and CBC today. We discussed the pathogenesis, transmission,  and treatments for Hepatitis B. Pending the blood work results, it appears reasonable to hold on antiviral therapy at present given the most recent improvements as she has no cirrhosis noted on her ultrasound. If this is new Hepatitis B would suspect she should be able to clear the virus without medication. Will plan to recheck in 3 months  or sooner if needed unless symptoms do not improve or worsen.       Relevant Orders   CBC   Hepatic function panel       I have discontinued Rosey Bath A. Magwood's acetaZOLAMIDE and Klor-Con M10. I am also having her maintain her aspirin EC, escitalopram, and hyoscyamine.    Follow-up: Return in about 3 months (around 12/23/2018).    Marcos Eke, MSN, FNP-C Nurse Practitioner [facility] for Infectious Disease [facility] Office phone: [phone] Pager: [phone] [facility] Main number: [phone]

## 2018-09-24 LAB — HEPATIC FUNCTION PANEL
AG Ratio: 1 (calc) (ref 1.0–2.5)
ALT: 631 U/L — ABNORMAL HIGH (ref 6–29)
AST: 877 U/L — ABNORMAL HIGH (ref 10–35)
Albumin: 2.8 g/dL — ABNORMAL LOW (ref 3.6–5.1)
Alkaline phosphatase (APISO): 314 U/L — ABNORMAL HIGH (ref 37–153)
Bilirubin, Direct: 8.1 mg/dL — ABNORMAL HIGH (ref 0.0–0.2)
Globulin: 2.8 g/dL (calc) (ref 1.9–3.7)
Indirect Bilirubin: 7.4 mg/dL (calc) — ABNORMAL HIGH (ref 0.2–1.2)
Total Bilirubin: 15.5 mg/dL — ABNORMAL HIGH (ref 0.2–1.2)
Total Protein: 5.6 g/dL — ABNORMAL LOW (ref 6.1–8.1)

## 2018-09-24 LAB — CBC
HCT: 36.5 % (ref 35.0–45.0)
Hemoglobin: 12.5 g/dL (ref 11.7–15.5)
MCH: 30.4 pg (ref 27.0–33.0)
MCHC: 34.2 g/dL (ref 32.0–36.0)
MCV: 88.8 fL (ref 80.0–100.0)
MPV: 11.5 fL (ref 7.5–12.5)
Platelets: 199 10*3/uL (ref 140–400)
RBC: 4.11 10*6/uL (ref 3.80–5.10)
RDW: 17.1 % — ABNORMAL HIGH (ref 11.0–15.0)
WBC: 6.5 10*3/uL (ref 3.8–10.8)

## 2018-09-24 NOTE — Telephone Encounter (Signed)
Please see additional information concerning this patient 

## 2018-09-26 ENCOUNTER — Other Ambulatory Visit: Payer: Self-pay

## 2018-09-26 ENCOUNTER — Encounter: Payer: Self-pay | Admitting: Internal Medicine

## 2018-09-26 ENCOUNTER — Other Ambulatory Visit: Payer: Self-pay | Admitting: Internal Medicine

## 2018-09-26 ENCOUNTER — Other Ambulatory Visit (INDEPENDENT_AMBULATORY_CARE_PROVIDER_SITE_OTHER): Payer: BC Managed Care – PPO

## 2018-09-26 ENCOUNTER — Ambulatory Visit (INDEPENDENT_AMBULATORY_CARE_PROVIDER_SITE_OTHER): Payer: BC Managed Care – PPO | Admitting: Internal Medicine

## 2018-09-26 VITALS — BP 100/70 | HR 87 | Temp 98.2°F | Ht 58.5 in | Wt 132.0 lb

## 2018-09-26 DIAGNOSIS — B169 Acute hepatitis B without delta-agent and without hepatic coma: Secondary | ICD-10-CM

## 2018-09-26 LAB — HEPATIC FUNCTION PANEL
ALT: 615 U/L — ABNORMAL HIGH (ref 0–35)
AST: 806 U/L — ABNORMAL HIGH (ref 0–37)
Albumin: 2.9 g/dL — ABNORMAL LOW (ref 3.5–5.2)
Alkaline Phosphatase: 322 U/L — ABNORMAL HIGH (ref 39–117)
Bilirubin, Direct: 6 mg/dL — ABNORMAL HIGH (ref 0.0–0.3)
Total Bilirubin: 11.5 mg/dL — ABNORMAL HIGH (ref 0.2–1.2)
Total Protein: 6.5 g/dL (ref 6.0–8.3)

## 2018-09-26 NOTE — Assessment & Plan Note (Signed)
Checking hepatic functional panel. If still improving recheck in 2 weeks. Counseled about course and recovery being variable.

## 2018-09-26 NOTE — Patient Instructions (Addendum)
I would recommend to call infectious disease for your husband for a visit to see if you need anything else at [phone]

## 2018-09-26 NOTE — Progress Notes (Signed)
   Subjective:   Patient ID: Stephanie Booker, female    DOB: 12/31/1960, 58 y.o.   MRN: [id number]  HPI The patient is a 58 YO female coming in for ongoing care about her acute hepatitis B. She is working with [employer] comp due to likely exposure from work. Still having abdominal tenderness/bloating. This is improving slightly. She is feeling like she is less yellow. Denies fevers or chills. Still very fatigued and poor appetite.   Review of Systems  Constitutional: Positive for activity change and appetite change.  HENT: Negative.   Eyes: Negative.   Respiratory: Negative for cough, chest tightness and shortness of breath.   Cardiovascular: Negative for chest pain, palpitations and leg swelling.  Gastrointestinal: Positive for abdominal distention and abdominal pain. Negative for constipation, diarrhea, nausea and vomiting.       Jaundice  Musculoskeletal: Negative.   Skin: Negative.   Neurological: Positive for weakness.  Psychiatric/Behavioral: Negative.     Objective:  Physical Exam Constitutional:      Appearance: She is well-developed.  HENT:     Head: Normocephalic and atraumatic.  Neck:     Musculoskeletal: Normal range of motion.  Cardiovascular:     Rate and Rhythm: Normal rate and regular rhythm.  Pulmonary:     Effort: Pulmonary effort is normal. No respiratory distress.     Breath sounds: Normal breath sounds. No wheezing or rales.  Abdominal:     General: Bowel sounds are normal. There is distension.     Palpations: Abdomen is soft.     Tenderness: There is abdominal tenderness. There is no rebound.     Comments: Mild diffuse distention and mild tenderness, normal BS, no guarding or rebound  Skin:    General: Skin is warm and dry.     Comments: Still jaundiced but improving slightly  Neurological:     Mental Status: She is alert and oriented to person, place, and time.     Coordination: Coordination normal.     Vitals:   09/26/18 1003  BP: 100/70   Pulse: 87  Temp: 98.2 F (36.8 C)  TempSrc: Oral  SpO2: 98%  Weight: 132 lb (59.9 kg)  Height: 4' 10.5" (1.486 m)   Visit time 25 minutes: greater than 50% of that time was spent in face to face counseling and coordination of care with the patient: counseled about course and timeline for hepatitis B, recovery, when we expect improvement  Assessment & Plan:

## 2018-10-03 ENCOUNTER — Telehealth: Payer: Self-pay | Admitting: Gastroenterology

## 2018-10-03 NOTE — Telephone Encounter (Signed)
Obviously one does not know for certain in a patient that I have never seen who is in [place] this Friday afternoon.  However given the available information, and a normal colonoscopy elsewhere in 2013, I suspect that her bleeding is due to benign anorectal pathology (particularly given the abrupt onset, isolated nature, and rectal discomfort).  Agree with local measures for now.  Obviously, if bleeding increases then she should go to the emergency room for evaluation.  Otherwise, routine evaluation with Dr. Bryan Lemma-- who may wish to consider colonoscopy since it has been 7+ years. Thanks.

## 2018-10-03 NOTE — Telephone Encounter (Addendum)
DOD PM-Dr. Bryan Lemma pt- Called and spoke with patient-patient reports she had a BM with more blood than usual -one time episode-also reports the bottom of her abdomen is sore- Patient reports the blood was fresh red blood -in the toilet-blood on TP; rectal pain (soreness); formed but very soft stool; Preparation-H/OTC cream for redness/soreness being used; using baby wipes to clean after BM; not able to give stool sample if requested as she lives in [place] and will need husband to driver her here-he is at work;    Patient has been scheduled for an appt to be seen on 10/10/2018 at 1:40 pm;   Please advise

## 2018-10-03 NOTE — Telephone Encounter (Signed)
Patient is aware to go tot he ED if symptoms worsen but is "okay with waiting to be seen by Dr. Bryan Lemma on scheduled appt date/time

## 2018-10-03 NOTE — Telephone Encounter (Signed)
Pt reported rectal bleeding.  Please advise.  °

## 2018-10-06 ENCOUNTER — Telehealth: Payer: Self-pay

## 2018-10-06 NOTE — Telephone Encounter (Signed)
Letter from 09/15/18 can be reprinted for her, this was given to patient on that date

## 2018-10-06 NOTE — Telephone Encounter (Signed)
Copied from [place] [id number]. Topic: General - Other >> Oct 06, 2018  3:08 PM Wynetta Emery, [place] C wrote: Reason for CRM: pt called in to be advised by PCP . Pt says that she has something going on with her liver. Pt says that she was taking out of work. Pt says that she need a note excusing her.  CB: [phone]

## 2018-10-07 ENCOUNTER — Encounter: Payer: Self-pay | Admitting: Gastroenterology

## 2018-10-07 ENCOUNTER — Other Ambulatory Visit: Payer: Self-pay | Admitting: Internal Medicine

## 2018-10-07 NOTE — Telephone Encounter (Signed)
Letter placed up front for pick up on Friday

## 2018-10-09 ENCOUNTER — Other Ambulatory Visit: Payer: Self-pay | Admitting: Internal Medicine

## 2018-10-10 ENCOUNTER — Other Ambulatory Visit: Payer: Self-pay | Admitting: Internal Medicine

## 2018-10-10 ENCOUNTER — Other Ambulatory Visit (INDEPENDENT_AMBULATORY_CARE_PROVIDER_SITE_OTHER): Payer: BC Managed Care – PPO

## 2018-10-10 ENCOUNTER — Ambulatory Visit: Payer: BC Managed Care – PPO | Admitting: Gastroenterology

## 2018-10-10 DIAGNOSIS — B169 Acute hepatitis B without delta-agent and without hepatic coma: Secondary | ICD-10-CM

## 2018-10-10 LAB — HEPATIC FUNCTION PANEL
ALT: 176 U/L — ABNORMAL HIGH (ref 0–35)
AST: 167 U/L — ABNORMAL HIGH (ref 0–37)
Albumin: 3.3 g/dL — ABNORMAL LOW (ref 3.5–5.2)
Alkaline Phosphatase: 180 U/L — ABNORMAL HIGH (ref 39–117)
Bilirubin, Direct: 2 mg/dL — ABNORMAL HIGH (ref 0.0–0.3)
Total Bilirubin: 4.4 mg/dL — ABNORMAL HIGH (ref 0.2–1.2)
Total Protein: 6.7 g/dL (ref 6.0–8.3)

## 2018-10-13 ENCOUNTER — Other Ambulatory Visit: Payer: Self-pay | Admitting: Internal Medicine

## 2018-10-14 ENCOUNTER — Telehealth: Payer: Self-pay

## 2018-10-14 NOTE — Telephone Encounter (Signed)
Pt. Reports she received the letter from Dr. Sharlet Salina for work. Asking why she would be out until 11/15/18. States she does not feel "strong enough to go back to work yet, anyway." Instructed pt. Her PCP may feel she needs this time to heal and get better. Will call back with employer's fax number to have letter sent to them.

## 2018-10-14 NOTE — Telephone Encounter (Signed)
Faxed letter to number below.

## 2018-10-14 NOTE — Telephone Encounter (Signed)
She has the letter that she can give to work, correct? And we would be continuing to evaluate her condition depending on how she feels. I am not sure what she is asking.

## 2018-10-14 NOTE — Telephone Encounter (Signed)
Pt calling back. States work note needs to be faxed Attn:  Adah Perl, fax number: [phone].

## 2018-10-20 ENCOUNTER — Telehealth: Payer: Self-pay | Admitting: Internal Medicine

## 2018-10-20 NOTE — Telephone Encounter (Signed)
Patient dropped off FMLA for [employer] to be completed by Dr Sharlet Salina. Patient would like these faxed when ready to [phone].  Placed in Brittany's box for completion.

## 2018-10-22 DIAGNOSIS — Z0279 Encounter for issue of other medical certificate: Secondary | ICD-10-CM

## 2018-10-22 NOTE — Telephone Encounter (Signed)
Form has been completed &Placed in providers box to review and sign.  

## 2018-10-22 NOTE — Telephone Encounter (Signed)
Form has been signed, Faxed as requested, Copy sent to scan &Charged for.   Patient's VM is full - Original has been mailed to patient for her records.

## 2018-10-30 ENCOUNTER — Other Ambulatory Visit: Payer: Self-pay | Admitting: Internal Medicine

## 2018-10-31 ENCOUNTER — Ambulatory Visit: Payer: BC Managed Care – PPO | Admitting: Gastroenterology

## 2018-10-31 ENCOUNTER — Other Ambulatory Visit (INDEPENDENT_AMBULATORY_CARE_PROVIDER_SITE_OTHER): Payer: BC Managed Care – PPO

## 2018-10-31 DIAGNOSIS — B169 Acute hepatitis B without delta-agent and without hepatic coma: Secondary | ICD-10-CM

## 2018-10-31 LAB — HEPATIC FUNCTION PANEL
ALT: 201 U/L — ABNORMAL HIGH (ref 0–35)
AST: 162 U/L — ABNORMAL HIGH (ref 0–37)
Albumin: 3.7 g/dL (ref 3.5–5.2)
Alkaline Phosphatase: 133 U/L — ABNORMAL HIGH (ref 39–117)
Bilirubin, Direct: 0.7 mg/dL — ABNORMAL HIGH (ref 0.0–0.3)
Total Bilirubin: 1.6 mg/dL — ABNORMAL HIGH (ref 0.2–1.2)
Total Protein: 7.1 g/dL (ref 6.0–8.3)

## 2018-11-03 ENCOUNTER — Telehealth: Payer: Self-pay

## 2018-11-03 NOTE — Telephone Encounter (Signed)
Patient needs a letter stating that she is able to go back to work on the 19th so that they let her go back since the other letter states that she had to be out till the 24th.

## 2018-11-03 NOTE — Telephone Encounter (Signed)
Patient wanting to know when she can go back to work. She is written out till end of October. States she does feel better not a 100% back but is able to clean house and do everyday things now and states is not napping like she was doing. States that if she can go back before the end of the month needs a letter stating that. Patient states she needs to go back soon as she is not getting paid right now

## 2018-11-03 NOTE — Telephone Encounter (Signed)
Copied from [place] [phone]. Topic: General - Other >> Nov 03, 2018  9:59 AM Pauline Good wrote: Reason for CRM: pt need to speak to nurse concerning when she can go back to work and discuss  her labs.

## 2018-11-03 NOTE — Telephone Encounter (Signed)
Patients husband informed of MD response may call back as she may need a letter stating she can go back to work

## 2018-11-03 NOTE — Telephone Encounter (Signed)
She can go back when she feels able.

## 2018-11-03 NOTE — Telephone Encounter (Signed)
Patient wanting to know is she needing to make an appointment with [place] in three months to check labs or get them checked in a month like she was doing?

## 2018-11-03 NOTE — Telephone Encounter (Signed)
Pt called back for Lesly Rubenstein, she stated she had a few more questions  Best number  [phone]

## 2018-11-04 NOTE — Telephone Encounter (Signed)
Okay to change letter. She has visit with infectious disease in December and they will check liver numbers then. She does not need this done sooner. I would recommend follow up with [place] in about 3-4 months from now.

## 2018-11-04 NOTE — Telephone Encounter (Signed)
Letter written and faxed to [phone] and patient informed of MD response and stated understanding

## 2018-11-14 ENCOUNTER — Telehealth: Payer: Self-pay | Admitting: Internal Medicine

## 2018-11-14 NOTE — Telephone Encounter (Signed)
Copied from [place] [id number]. Topic: General - Other >> Nov 14, 2018 12:23 PM Keene Breath wrote: Reason for CRM: Patient called to speak with the nurse about COVID.  Would not give any other details.  Michela Pitcher it is important that she speak with the nurse asap.  CB# [phone]

## 2018-11-14 NOTE — Telephone Encounter (Signed)
Can you call patient please?

## 2018-11-14 NOTE — Telephone Encounter (Signed)
Patient states that she has tested positive for COVID at an [facility], she did rapid test to begin with and has since taken another test that should result in a few days---she is asymptomatic--and I have advised that patient quarantine for at least 14 days if no symptoms develop and if she becomes symptomatic, would need to be symptom free for 21 days---ok to call back to schedule virtual  Visit with dr Sharlet Salina if she needs other advisement or if she worsens, also ok to go to [facility] urgent care or ED if needed---I have advised to use OTC products for cough,fever,etc-if symptoms develop---only tylenol if she is ok with tolerating tylenol (patient has liver issues), but not recommended to use ibuprofen products with COVID---also important to wear mask and social distance whenever around any other person(s), wash hands often, try to eat/drink foods that are rich in vitamin C to help boost immune system naturally----can talk with Lakshmi Sundeen,RN at [place] office if any further questions----also recommended that her husband be tested, I have given address/hours of operation for [facility] in [place]

## 2018-11-18 ENCOUNTER — Ambulatory Visit: Payer: Self-pay

## 2018-11-18 NOTE — Telephone Encounter (Signed)
We can do virtual to discuss.

## 2018-11-18 NOTE — Telephone Encounter (Signed)
Patient called stating that she was tested positive for COVID-19 Thursday.  She has had fever but it is gone.  She denies SOB or breathing issues She states her O2 saturation is 97%. She has an occasional cough.  Her worse symptom is her throat and head. She called to ask question about therapeutics.  She wondered if they were available for use.  She was informed that some therapeutics are available for in hospital use depending on physicians and there assessment of the patient. She states she was curious.  She states her symptoms are better than they were yesterday. Home care advice was reviewed with patient. She is request call from Dr to discuss.  Reason for Disposition . [1] [id number] diagnosed by positive lab test AND [2] mild symptoms (e.g., cough, fever, others) AND [9] no complications or SOB  Answer Assessment - Initial Assessment Questions 1. COVID-19 DIAGNOSIS: "Who made your Coronavirus (COVID-19) diagnosis?" "Was it confirmed by a positive lab test?" If not diagnosed by a HCP, ask "Are there lots of cases (community spread) where you live?" (See public health department website, if unsure)     Positive Covid last thursday 2. ONSET: "When did the COVID-19 symptoms start?"     Last thursday 3. WORST SYMPTOM: "What is your worst symptom?" (e.g., cough, fever, shortness of breath, muscle aches)    Throat and head 4. COUGH: "Do you have a cough?" If so, ask: "How bad is the cough?"      At times 5. FEVER: "Do you have a fever?" If so, ask: "What is your temperature, how was it measured, and when did it start?"     No not today 6. RESPIRATORY STATUS: "Describe your breathing?" (e.g., shortness of breath, wheezing, unable to speak)      Normal 97% 7. BETTER-SAME-WORSE: "Are you getting better, staying the same or getting worse compared to yesterday?"  If getting worse, ask, "In what way?"     better 8. HIGH RISK DISEASE: "Do you have any chronic medical problems?" (e.g., asthma, heart or  lung disease, weak immune system, etc.)    no 9. PREGNANCY: "Is there any chance you are pregnant?" "When was your last menstrual period?"     No 10. OTHER SYMPTOMS: "Do you have any other symptoms?"  (e.g., chills, fatigue, headache, loss of smell or taste, muscle pain, sore throat)     Headache no smell  Protocols used: CORONAVIRUS (COVID-19) DIAGNOSED OR SUSPECTED-A-AH

## 2018-11-18 NOTE — Telephone Encounter (Signed)
Pt scheduled  

## 2018-11-19 ENCOUNTER — Ambulatory Visit (INDEPENDENT_AMBULATORY_CARE_PROVIDER_SITE_OTHER): Payer: BC Managed Care – PPO | Admitting: Internal Medicine

## 2018-11-19 ENCOUNTER — Encounter: Payer: Self-pay | Admitting: Internal Medicine

## 2018-11-19 DIAGNOSIS — U071 COVID-19: Secondary | ICD-10-CM | POA: Diagnosis not present

## 2018-11-19 NOTE — Progress Notes (Signed)
Virtual Visit via Video Note  I connected with Stephanie Booker on 11/19/18 at 11:00 AM EDT by a video enabled telemedicine application and verified that I am speaking with the correct person using two identifiers.  The patient and the provider were at separate locations throughout the entire encounter.   I discussed the limitations of evaluation and management by telemedicine and the availability of in person appointments. The patient expressed understanding and agreed to proceed. The patient and the provider were the only parties present for the visit unless noted in HPI below.  History of Present Illness: The patient is a 58 y.o. female with visit for concerns about covid-19. Tested positive for covid-19 on 11/13/18. She is having mild cough and sore throat and congestion. Denies SOB. Denies muscle aches. Has minimal congestion without throat pain now. Smoker and about same as normal. Overall it is improving. Has tried nothing.   Observations/Objective: Appearance: normal, breathing appears normal, no coughing through the visit, casual grooming, abdomen does not appear distended, throat not examined due to poor video quality, memory normal, mental status is A and O times 3  Assessment and Plan: See problem oriented charting  Follow Up Instructions: continue with quarantine from others in the household and earliest return to work will be 14 days  Visit time 25 minutes: greater than 50% of that time was spent in face to face counseling and coordination of care with the patient: counseled about need to quarantine, course, timeline, options for treatment if symptoms worsen  I discussed the assessment and treatment plan with the patient. The patient was provided an opportunity to ask questions and all were answered. The patient agreed with the plan and demonstrated an understanding of the instructions.   The patient was advised to call back or seek an in-person evaluation if the symptoms worsen or  if the condition fails to improve as anticipated.  Hoyt Koch, MD

## 2018-11-19 NOTE — Assessment & Plan Note (Signed)
Tested positive on 11/13/18. Overall symptoms mild at this time. Answered questions about therapeutics, quarantine.

## 2018-11-21 ENCOUNTER — Other Ambulatory Visit: Payer: Self-pay

## 2018-11-21 ENCOUNTER — Other Ambulatory Visit: Payer: Self-pay | Admitting: Internal Medicine

## 2018-11-21 DIAGNOSIS — Z20822 Contact with and (suspected) exposure to covid-19: Secondary | ICD-10-CM

## 2018-11-23 LAB — NOVEL CORONAVIRUS, NAA: SARS-CoV-2, NAA: DETECTED — AB

## 2018-11-24 ENCOUNTER — Telehealth: Payer: Self-pay | Admitting: *Deleted

## 2018-11-24 ENCOUNTER — Telehealth: Payer: Self-pay | Admitting: Internal Medicine

## 2018-11-24 NOTE — Telephone Encounter (Signed)
Reviewed positive [phone] results with patient. She tested positive on 11/14/18. She is currently not having any symptoms and feels well. Stated she retested for work but is likely she retested too soon after the 1st positive. Recommended she stay in isolation until her original given end date and as long as she remains symptoms free, not to retest again. Should you have any breathing concerns call back. After isolation, wear a mask in public, social distance and wash hands frequently. [facility] notified by other.

## 2018-11-28 ENCOUNTER — Telehealth: Payer: Self-pay | Admitting: Internal Medicine

## 2018-11-28 NOTE — Telephone Encounter (Signed)
KLOR-CON

## 2018-11-28 NOTE — Telephone Encounter (Signed)
Pt called in to follow up, pt says that she is completely out of her medication. Pt declined scheduling an apt because she said that she was just seen by PCP and discuss needing medication with her then   Pt would like to know if provider would send in medication?

## 2018-11-28 NOTE — Telephone Encounter (Signed)
Tried calling patient back to inform of MD response but no voicemail no answer. Routing to [facility] incase patient calls back

## 2018-11-28 NOTE — Telephone Encounter (Signed)
She should not be taking this anymore as she is not taking the diamox anymore.

## 2018-11-28 NOTE — Telephone Encounter (Signed)
What med is she asking for?

## 2018-11-30 ENCOUNTER — Other Ambulatory Visit: Payer: Self-pay | Admitting: Internal Medicine

## 2018-12-23 ENCOUNTER — Ambulatory Visit: Payer: BC Managed Care – PPO | Admitting: Family

## 2019-01-21 ENCOUNTER — Telehealth: Payer: Self-pay

## 2019-01-21 NOTE — Telephone Encounter (Signed)
Do you recommend this patient get the vaccine? Please advise.   Copied from [place] [id number]. Topic: Quick Communication - See Telephone Encounter >> Jan 21, 2019 12:06 PM Loma Boston wrote: CRM for notification. 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

## 2019-01-22 NOTE — Telephone Encounter (Signed)
Pt had been informed. She states that she will get it on Sunday when she goes to work.

## 2019-01-22 NOTE — Telephone Encounter (Signed)
She has no reason not to get it. I would recommend this.

## 2019-01-30 ENCOUNTER — Encounter: Payer: Self-pay | Admitting: Internal Medicine

## 2019-01-30 ENCOUNTER — Ambulatory Visit (INDEPENDENT_AMBULATORY_CARE_PROVIDER_SITE_OTHER): Payer: BC Managed Care – PPO | Admitting: Internal Medicine

## 2019-01-30 ENCOUNTER — Other Ambulatory Visit: Payer: Self-pay

## 2019-01-30 DIAGNOSIS — E785 Hyperlipidemia, unspecified: Secondary | ICD-10-CM

## 2019-01-30 DIAGNOSIS — R7989 Other specified abnormal findings of blood chemistry: Secondary | ICD-10-CM | POA: Diagnosis not present

## 2019-01-30 DIAGNOSIS — B169 Acute hepatitis B without delta-agent and without hepatic coma: Secondary | ICD-10-CM | POA: Diagnosis not present

## 2019-01-30 DIAGNOSIS — G932 Benign intracranial hypertension: Secondary | ICD-10-CM

## 2019-01-30 MED ORDER — ACETAZOLAMIDE 250 MG PO TABS: 250.0000 mg | ORAL_TABLET | Freq: Two times a day (BID) | ORAL | 3 refills | Status: DC

## 2019-01-30 NOTE — Assessment & Plan Note (Signed)
Ordered follow up hepatitis B labs and LFTs. Last LFTs with stable elevation of LFTs with normalization of bilirubin. She has returned to work.

## 2019-01-30 NOTE — Assessment & Plan Note (Signed)
Still holding on this until hep b status is clear. Her LFTs are stable so depending on labs today may be able to safely resume statin with close monitoring.

## 2019-01-30 NOTE — Progress Notes (Signed)
Virtual Visit via Video Note  I connected with Stephanie Booker on 01/30/19 at 10:40 AM EST by a video enabled telemedicine application and verified that I am speaking with the correct person using two identifiers.  The patient and the provider were at separate locations throughout the entire encounter.   I discussed the limitations of evaluation and management by telemedicine and the availability of in person appointments. The patient expressed understanding and agreed to proceed. The patient and the provider were the only parties present for the visit unless noted in HPI below.  History of Present Illness: The patient is a 59 y.o. female with visit for follow up hepatitis B. She did not follow up with ID as expected early December. She is still having some fatigue although this is gradually improving. Set back by covid-19 infection late October. Is recovered from that. Has chronic smoker's cough and staff heard this on the phone so the visit was converted to virtual due to cough. She states it might be a little worse now. She has gotten first dose covid-19 vaccine already.   Observations/Objective: Appearance: normal, breathing appears normal, casual grooming, abdomen does not appear distended, throat normal, memory normal, mental status is A and O times 3  Assessment and Plan: See problem oriented charting  Follow Up Instructions: wait 1 week then can get labs to follow up hepatitis B status and lfts. Depending on results she will likely need to return to ID for visit and this was discussed with her today.   I discussed the assessment and treatment plan with the patient. The patient was provided an opportunity to ask questions and all were answered. The patient agreed with the plan and demonstrated an understanding of the instructions.   The patient was advised to call back or seek an in-person evaluation if the symptoms worsen or if the condition fails to improve as anticipated.  Myrlene Broker, MD

## 2019-01-30 NOTE — Assessment & Plan Note (Signed)
Due to hep b, stable on last monitoring but still elevated about 2-3 times ULN. Checking hepatic function panel today and hep b status.

## 2019-01-30 NOTE — Assessment & Plan Note (Signed)
Can get back on acetazolamide now that acute hep b is resolved and refilled today.

## 2019-02-06 ENCOUNTER — Telehealth: Payer: Self-pay

## 2019-02-06 ENCOUNTER — Other Ambulatory Visit (INDEPENDENT_AMBULATORY_CARE_PROVIDER_SITE_OTHER): Payer: BC Managed Care – PPO

## 2019-02-06 ENCOUNTER — Other Ambulatory Visit: Payer: Self-pay

## 2019-02-06 DIAGNOSIS — E785 Hyperlipidemia, unspecified: Secondary | ICD-10-CM

## 2019-02-06 DIAGNOSIS — B169 Acute hepatitis B without delta-agent and without hepatic coma: Secondary | ICD-10-CM

## 2019-02-06 LAB — HEPATIC FUNCTION PANEL
ALT: 13 U/L (ref 0–35)
AST: 18 U/L (ref 0–37)
Albumin: 4.2 g/dL (ref 3.5–5.2)
Alkaline Phosphatase: 92 U/L (ref 39–117)
Bilirubin, Direct: 0.1 mg/dL (ref 0.0–0.3)
Total Bilirubin: 0.6 mg/dL (ref 0.2–1.2)
Total Protein: 7.3 g/dL (ref 6.0–8.3)

## 2019-02-06 LAB — LIPID PANEL
Cholesterol: 262 mg/dL — ABNORMAL HIGH (ref 0–200)
HDL: 40.7 mg/dL (ref >39.00)
NonHDL: 220.96
Total CHOL/HDL Ratio: 6
Triglycerides: 274 mg/dL — ABNORMAL HIGH (ref 0.0–149.0)
VLDL: 54.8 mg/dL — ABNORMAL HIGH (ref 0.0–40.0)

## 2019-02-06 LAB — PROTIME-INR
INR: 1 ratio (ref 0.8–1.0)
Prothrombin Time: 11.4 s (ref 9.6–13.1)

## 2019-02-06 LAB — LDL CHOLESTEROL, DIRECT: Direct LDL: 164 mg/dL

## 2019-02-06 NOTE — Telephone Encounter (Signed)
Patient was here for other labs but was worried about cholesterol since she has been off her meds. I have ordered to be done.   FYI

## 2019-02-13 ENCOUNTER — Other Ambulatory Visit (INDEPENDENT_AMBULATORY_CARE_PROVIDER_SITE_OTHER): Payer: BC Managed Care – PPO

## 2019-02-13 ENCOUNTER — Other Ambulatory Visit: Payer: Self-pay

## 2019-02-13 DIAGNOSIS — B169 Acute hepatitis B without delta-agent and without hepatic coma: Secondary | ICD-10-CM

## 2019-02-13 LAB — BASIC METABOLIC PANEL
BUN: 22 mg/dL (ref 6–23)
CO2: 19 mEq/L (ref 19–32)
Calcium: 8.6 mg/dL (ref 8.4–10.5)
Chloride: 111 mEq/L (ref 96–112)
Creatinine, Ser: 0.68 mg/dL (ref 0.40–1.20)
GFR: 88.78 mL/min (ref >60.00)
Glucose, Bld: 86 mg/dL (ref 70–99)
Potassium: 3.8 mEq/L (ref 3.5–5.1)
Sodium: 139 mEq/L (ref 135–145)

## 2019-02-13 LAB — HEPATIC FUNCTION PANEL
ALT: 13 U/L (ref 0–35)
AST: 17 U/L (ref 0–37)
Albumin: 4.1 g/dL (ref 3.5–5.2)
Alkaline Phosphatase: 89 U/L (ref 39–117)
Bilirubin, Direct: 0.1 mg/dL (ref 0.0–0.3)
Total Bilirubin: 0.5 mg/dL (ref 0.2–1.2)
Total Protein: 7.2 g/dL (ref 6.0–8.3)

## 2019-02-13 NOTE — Addendum Note (Signed)
Addended by: Roney Mans on: 02/13/2019 11:58 AM   Modules accepted: Orders

## 2019-02-16 ENCOUNTER — Other Ambulatory Visit: Payer: Self-pay | Admitting: Internal Medicine

## 2019-02-16 LAB — HEPATITIS B E ANTIBODY: Hep B E Ab: REACTIVE — AB

## 2019-02-16 LAB — HEPATITIS B E ANTIGEN: Hep B E Ag: NONREACTIVE

## 2019-02-16 LAB — HEPATITIS B CORE ANTIBODY, IGM: Hep B C IgM: REACTIVE — AB

## 2019-02-16 LAB — HBV QUANT PCR RFX TO GENOTYPE: HBV IU/mL: <10 IU/mL

## 2019-02-16 LAB — HEPATITIS B SURFACE ANTIGEN: Hepatitis B Surface Ag: NONREACTIVE

## 2019-02-16 LAB — HEPATITIS B SURFACE ANTIBODY, QUANTITATIVE: Hep B S AB Quant (Post): 33 m[IU]/mL (ref >=10)

## 2019-02-16 MED ORDER — ATORVASTATIN CALCIUM 20 MG PO TABS: 20.0000 mg | ORAL_TABLET | Freq: Every day | ORAL | 3 refills | Status: DC

## 2019-02-17 ENCOUNTER — Telehealth: Payer: Self-pay | Admitting: Internal Medicine

## 2019-02-17 NOTE — Telephone Encounter (Signed)
Please call with lab results 

## 2019-02-17 NOTE — Telephone Encounter (Signed)
Have you had a change to review the other results yet? I only see the first notation. Please advise.

## 2019-02-17 NOTE — Telephone Encounter (Signed)
Will get to them when able

## 2019-02-20 NOTE — Telephone Encounter (Signed)
Patient called to follow up on results. She has been informed and understood.

## 2019-03-04 ENCOUNTER — Telehealth: Payer: Self-pay

## 2019-03-04 NOTE — Telephone Encounter (Signed)
New message    The patient wanted to know when was her last Tetanus shot.

## 2019-03-04 NOTE — Telephone Encounter (Signed)
02/28/2015 

## 2019-04-26 ENCOUNTER — Other Ambulatory Visit: Payer: Self-pay | Admitting: Internal Medicine

## 2019-08-21 ENCOUNTER — Ambulatory Visit (INDEPENDENT_AMBULATORY_CARE_PROVIDER_SITE_OTHER): Payer: BC Managed Care – PPO | Admitting: Internal Medicine

## 2019-08-21 ENCOUNTER — Other Ambulatory Visit: Payer: Self-pay

## 2019-08-21 ENCOUNTER — Encounter: Payer: Self-pay | Admitting: Internal Medicine

## 2019-08-21 VITALS — BP 128/86 | HR 90 | Temp 98.2°F | Ht 58.5 in | Wt 149.0 lb

## 2019-08-21 DIAGNOSIS — E785 Hyperlipidemia, unspecified: Secondary | ICD-10-CM | POA: Diagnosis not present

## 2019-08-21 DIAGNOSIS — I10 Essential (primary) hypertension: Secondary | ICD-10-CM | POA: Diagnosis not present

## 2019-08-21 DIAGNOSIS — F4323 Adjustment disorder with mixed anxiety and depressed mood: Secondary | ICD-10-CM

## 2019-08-21 DIAGNOSIS — Z0000 Encounter for general adult medical examination without abnormal findings: Secondary | ICD-10-CM | POA: Diagnosis not present

## 2019-08-21 DIAGNOSIS — F17200 Nicotine dependence, unspecified, uncomplicated: Secondary | ICD-10-CM

## 2019-08-21 MED ORDER — ACETAZOLAMIDE 250 MG PO TABS: 250.0000 mg | ORAL_TABLET | Freq: Two times a day (BID) | ORAL | 3 refills | Status: DC

## 2019-08-21 MED ORDER — ATORVASTATIN CALCIUM 20 MG PO TABS: 20.0000 mg | ORAL_TABLET | Freq: Every day | ORAL | 3 refills | Status: DC

## 2019-08-21 MED ORDER — ESCITALOPRAM OXALATE 20 MG PO TABS: 20.0000 mg | ORAL_TABLET | Freq: Every day | ORAL | 3 refills | Status: DC

## 2019-08-21 NOTE — Assessment & Plan Note (Signed)
Satisfied with lexapro 20 mg daily.

## 2019-08-21 NOTE — Assessment & Plan Note (Signed)
Flu shot yearly, covid-19 complete. Shingrix counseled. Tetanus up to date. Colonoscopy up to date. Mammogram up to date, pap smear up to date. Counseled about sun safety and mole surveillance. Counseled about the dangers of distracted driving. Given 10 year screening recommendations.

## 2019-08-21 NOTE — Progress Notes (Signed)
   Subjective:   Patient ID: Stephanie Booker, female    DOB: 08-23-1960, 59 y.o.   MRN: [id number]  HPI The patient is a 59 YO female coming in for physical.   PMH, FMH, social history reviewed and updated  Review of Systems  Constitutional: Positive for fatigue.  HENT: Negative.   Eyes: Negative.   Respiratory: Negative for cough, chest tightness and shortness of breath.   Cardiovascular: Negative for chest pain, palpitations and leg swelling.  Gastrointestinal: Negative for abdominal distention, abdominal pain, constipation, diarrhea, nausea and vomiting.  Musculoskeletal: Negative.   Skin: Negative.   Neurological: Negative.   Psychiatric/Behavioral: Negative.     Objective:  Physical Exam Constitutional:      Appearance: She is well-developed.  HENT:     Head: Normocephalic and atraumatic.  Cardiovascular:     Rate and Rhythm: Normal rate and regular rhythm.  Pulmonary:     Effort: Pulmonary effort is normal. No respiratory distress.     Breath sounds: Normal breath sounds. No wheezing or rales.  Abdominal:     General: Bowel sounds are normal. There is no distension.     Palpations: Abdomen is soft.     Tenderness: There is no abdominal tenderness. There is no rebound.  Musculoskeletal:     Cervical back: Normal range of motion.  Skin:    General: Skin is warm and dry.  Neurological:     Mental Status: She is alert and oriented to person, place, and time.     Coordination: Coordination normal.     Vitals:   08/21/19 1403  BP: (!) 128/86  Pulse: 90  Temp: 98.2 F (36.8 C)  TempSrc: Oral  SpO2: 93%  Weight: 149 lb (67.6 kg)  Height: 4' 10.5" (1.486 m)    This visit occurred during the SARS-CoV-2 public health emergency.  Safety protocols were in place, including screening questions prior to the visit, additional usage of staff PPE, and extensive cleaning of exam room while observing appropriate contact time as indicated for disinfecting solutions.    Assessment & Plan:

## 2019-08-21 NOTE — Assessment & Plan Note (Signed)
Taking diamox and BP at goal. Checking CMP and adjust as needed.

## 2019-08-21 NOTE — Patient Instructions (Signed)
Health Maintenance, Female Adopting a healthy lifestyle and getting preventive care are important in promoting health and wellness. Ask your health care provider about:  The right schedule for you to have regular tests and exams.  Things you can do on your own to prevent diseases and keep yourself healthy. What should I know about diet, weight, and exercise? Eat a healthy diet   Eat a diet that includes plenty of vegetables, fruits, low-fat dairy products, and lean protein.  Do not eat a lot of foods that are high in solid fats, added sugars, or sodium. Maintain a healthy weight Body mass index (BMI) is used to identify weight problems. It estimates body fat based on height and weight. Your health care provider can help determine your BMI and help you achieve or maintain a healthy weight. Get regular exercise Get regular exercise. This is one of the most important things you can do for your health. Most adults should:  Exercise for at least 150 minutes each week. The exercise should increase your heart rate and make you sweat (moderate-intensity exercise).  Do strengthening exercises at least twice a week. This is in addition to the moderate-intensity exercise.  Spend less time sitting. Even light physical activity can be beneficial. Watch cholesterol and blood lipids Have your blood tested for lipids and cholesterol at 59 years of age, then have this test every 5 years. Have your cholesterol levels checked more often if:  Your lipid or cholesterol levels are high.  You are older than 59 years of age.  You are at high risk for heart disease. What should I know about cancer screening? Depending on your health history and family history, you may need to have cancer screening at various ages. This may include screening for:  Breast cancer.  Cervical cancer.  Colorectal cancer.  Skin cancer.  Lung cancer. What should I know about heart disease, diabetes, and high blood  pressure? Blood pressure and heart disease  High blood pressure causes heart disease and increases the risk of stroke. This is more likely to develop in people who have high blood pressure readings, are of African descent, or are overweight.  Have your blood pressure checked: ? Every 3-5 years if you are 18-39 years of age. ? Every year if you are 40 years old or older. Diabetes Have regular diabetes screenings. This checks your fasting blood sugar level. Have the screening done:  Once every three years after age 40 if you are at a normal weight and have a low risk for diabetes.  More often and at a younger age if you are overweight or have a high risk for diabetes. What should I know about preventing infection? Hepatitis B If you have a higher risk for hepatitis B, you should be screened for this virus. Talk with your health care provider to find out if you are at risk for hepatitis B infection. Hepatitis C Testing is recommended for:  Everyone born from 1945 through 1965.  Anyone with known risk factors for hepatitis C. Sexually transmitted infections (STIs)  Get screened for STIs, including gonorrhea and chlamydia, if: ? You are sexually active and are younger than 59 years of age. ? You are older than 59 years of age and your health care provider tells you that you are at risk for this type of infection. ? Your sexual activity has changed since you were last screened, and you are at increased risk for chlamydia or gonorrhea. Ask your health care provider if   you are at risk.  Ask your health care provider about whether you are at high risk for HIV. Your health care provider may recommend a prescription medicine to help prevent HIV infection. If you choose to take medicine to prevent HIV, you should first get tested for HIV. You should then be tested every 3 months for as long as you are taking the medicine. Pregnancy  If you are about to stop having your period (premenopausal) and  you may become pregnant, seek counseling before you get pregnant.  Take 400 to 800 micrograms (mcg) of folic acid every day if you become pregnant.  Ask for birth control (contraception) if you want to prevent pregnancy. Osteoporosis and menopause Osteoporosis is a disease in which the bones lose minerals and strength with aging. This can result in bone fractures. If you are 65 years old or older, or if you are at risk for osteoporosis and fractures, ask your health care provider if you should:  Be screened for bone loss.  Take a calcium or vitamin D supplement to lower your risk of fractures.  Be given hormone replacement therapy (HRT) to treat symptoms of menopause. Follow these instructions at home: Lifestyle  Do not use any products that contain nicotine or tobacco, such as cigarettes, e-cigarettes, and chewing tobacco. If you need help quitting, ask your health care provider.  Do not use street drugs.  Do not share needles.  Ask your health care provider for help if you need support or information about quitting drugs. Alcohol use  Do not drink alcohol if: ? Your health care provider tells you not to drink. ? You are pregnant, may be pregnant, or are planning to become pregnant.  If you drink alcohol: ? Limit how much you use to 0-1 drink a day. ? Limit intake if you are breastfeeding.  Be aware of how much alcohol is in your drink. In the [place] one drink equals one 12 oz bottle of beer (355 mL), one 5 oz glass of wine (148 mL), or one 1 oz glass of hard liquor (44 mL). General instructions  Schedule regular health, dental, and eye exams.  Stay current with your vaccines.  Tell your health care provider if: ? You often feel depressed. ? You have ever been abused or do not feel safe at home. Summary  Adopting a healthy lifestyle and getting preventive care are important in promoting health and wellness.  Follow your health care provider's instructions about healthy  diet, exercising, and getting tested or screened for diseases.  Follow your health care provider's instructions on monitoring your cholesterol and blood pressure. This information is not intended to replace advice given to you by your health care provider. Make sure you discuss any questions you have with your health care provider. Document Revised: 01/01/2018 Document Reviewed: 01/01/2018 Elsevier Patient Education  2020 [employer].  

## 2019-08-21 NOTE — Assessment & Plan Note (Signed)
Smoking and advised to quit, reminded about risks and harms from smoking.

## 2019-08-21 NOTE — Assessment & Plan Note (Signed)
Taking lipitor 20 mg daily and checking lipid panel and adjust as needed.  

## 2019-08-22 LAB — HEMOGLOBIN A1C
Hgb A1c MFr Bld: 4.9 % of total Hgb (ref <5.7)
Mean Plasma Glucose: 94 (calc)
eAG (mmol/L): 5.2 (calc)

## 2019-08-22 LAB — TSH: TSH: 1.11 mIU/L (ref 0.40–4.50)

## 2019-08-22 LAB — LIPID PANEL
Cholesterol: 188 mg/dL (ref <200)
HDL: 38 mg/dL — ABNORMAL LOW (ref >=50)
LDL Cholesterol (Calc): 106 mg/dL (calc) — ABNORMAL HIGH
Non-HDL Cholesterol (Calc): 150 mg/dL (calc) — ABNORMAL HIGH (ref <130)
Total CHOL/HDL Ratio: 4.9 (calc) (ref <5.0)
Triglycerides: 333 mg/dL — ABNORMAL HIGH (ref <150)

## 2019-08-22 LAB — VITAMIN B12: Vitamin B-12: 277 pg/mL (ref 200–1100)

## 2019-08-22 LAB — COMPREHENSIVE METABOLIC PANEL
AG Ratio: 1.6 (calc) (ref 1.0–2.5)
ALT: 13 U/L (ref 6–29)
AST: 15 U/L (ref 10–35)
Albumin: 4.1 g/dL (ref 3.6–5.1)
Alkaline phosphatase (APISO): 112 U/L (ref 37–153)
BUN: 15 mg/dL (ref 7–25)
CO2: 23 mmol/L (ref 20–32)
Calcium: 9.3 mg/dL (ref 8.6–10.4)
Chloride: 110 mmol/L (ref 98–110)
Creat: 0.79 mg/dL (ref 0.50–1.05)
Globulin: 2.5 g/dL (calc) (ref 1.9–3.7)
Glucose, Bld: 182 mg/dL — ABNORMAL HIGH (ref 65–99)
Potassium: 3.7 mmol/L (ref 3.5–5.3)
Sodium: 140 mmol/L (ref 135–146)
Total Bilirubin: 0.5 mg/dL (ref 0.2–1.2)
Total Protein: 6.6 g/dL (ref 6.1–8.1)

## 2019-08-22 LAB — CBC
HCT: 45.5 % — ABNORMAL HIGH (ref 35.0–45.0)
Hemoglobin: 15.6 g/dL — ABNORMAL HIGH (ref 11.7–15.5)
MCH: 32.5 pg (ref 27.0–33.0)
MCHC: 34.3 g/dL (ref 32.0–36.0)
MCV: 94.8 fL (ref 80.0–100.0)
MPV: 10.7 fL (ref 7.5–12.5)
Platelets: 203 10*3/uL (ref 140–400)
RBC: 4.8 10*6/uL (ref 3.80–5.10)
RDW: 11.8 % (ref 11.0–15.0)
WBC: 8.6 10*3/uL (ref 3.8–10.8)

## 2019-08-22 LAB — VITAMIN D 25 HYDROXY (VIT D DEFICIENCY, FRACTURES): Vit D, 25-Hydroxy: 13 ng/mL — ABNORMAL LOW (ref 30–100)

## 2019-08-24 ENCOUNTER — Other Ambulatory Visit: Payer: Self-pay | Admitting: Internal Medicine

## 2019-08-24 ENCOUNTER — Telehealth: Payer: Self-pay | Admitting: Internal Medicine

## 2019-08-24 MED ORDER — VITAMIN D (ERGOCALCIFEROL) 1.25 MG (50000 UNIT) PO CAPS
50000.0000 [IU] | ORAL_CAPSULE | ORAL | 0 refills | Status: DC
Start: 2019-08-24 — End: 2019-10-19

## 2019-08-24 NOTE — Telephone Encounter (Signed)
Patient missed call, requesting call back

## 2019-08-24 NOTE — Telephone Encounter (Signed)
Patient calling to discuss lab results.

## 2019-08-24 NOTE — Telephone Encounter (Signed)
Pt has been informed of results

## 2019-10-06 ENCOUNTER — Telehealth (INDEPENDENT_AMBULATORY_CARE_PROVIDER_SITE_OTHER): Payer: BC Managed Care – PPO | Admitting: Family

## 2019-10-06 DIAGNOSIS — R197 Diarrhea, unspecified: Secondary | ICD-10-CM

## 2019-10-06 NOTE — Patient Instructions (Signed)
Food Choices to Help Relieve Diarrhea, Adult When you have diarrhea, the foods you eat and your eating habits are very important. Choosing the right foods and drinks can help:  Relieve diarrhea.  Replace lost fluids and nutrients.  Prevent dehydration. What general guidelines should I follow?  Relieving diarrhea  Choose foods with less than 2 g or .07 oz. of fiber per serving.  Limit fats to less than 8 tsp (38 g or 1.34 oz.) a day.  Avoid the following: ? Foods and beverages sweetened with high-fructose corn syrup, honey, or sugar alcohols such as xylitol, sorbitol, and mannitol. ? Foods that contain a lot of fat or sugar. ? Fried, greasy, or spicy foods. ? High-fiber grains, breads, and cereals. ? Raw fruits and vegetables.  Eat foods that are rich in probiotics. These foods include dairy products such as yogurt and fermented milk products. They help increase healthy bacteria in the stomach and intestines (gastrointestinal tract, or GI tract).  If you have lactose intolerance, avoid dairy products. These may make your diarrhea worse.  Take medicine to help stop diarrhea (antidiarrheal medicine) only as told by your health care provider. Replacing nutrients  Eat small meals or snacks every 3-4 hours.  Eat bland foods, such as white rice, toast, or baked potato, until your diarrhea starts to get better. Gradually reintroduce nutrient-rich foods as tolerated or as told by your health care provider. This includes: ? Well-cooked protein foods. ? Peeled, seeded, and soft-cooked fruits and vegetables. ? Low-fat dairy products.  Take vitamin and mineral supplements as told by your health care provider. Preventing dehydration  Start by sipping water or a special solution to prevent dehydration (oral rehydration solution, ORS). Urine that is clear or pale yellow means that you are getting enough fluid.  Try to drink at least 8-10 cups of fluid each day to help replace lost  fluids.  You may add other liquids in addition to water, such as clear juice or decaffeinated sports drinks, as tolerated or as told by your health care provider.  Avoid drinks with caffeine, such as coffee, tea, or soft drinks.  Avoid alcohol. What foods are recommended?     The items listed may not be a complete list. Talk with your health care provider about what dietary choices are best for you. Grains White rice. White, French, or pita breads (fresh or toasted), including plain rolls, buns, or bagels. White pasta. Saltine, soda, or graham crackers. Pretzels. Low-fiber cereal. Cooked cereals made with water (such as cornmeal, farina, or cream cereals). Plain muffins. Matzo. Melba toast. Zwieback. Vegetables Potatoes (without the skin). Most well-cooked and canned vegetables without skins or seeds. Tender lettuce. Fruits Apple sauce. Fruits canned in juice. Cooked apricots, cherries, grapefruit, peaches, pears, or plums. Fresh bananas and cantaloupe. Meats and other protein foods Baked or boiled chicken. Eggs. Tofu. Fish. Seafood. Smooth nut butters. Ground or well-cooked tender beef, ham, veal, lamb, pork, or poultry. Dairy Plain yogurt, kefir, and unsweetened liquid yogurt. Lactose-free milk, buttermilk, skim milk, or soy milk. Low-fat or nonfat hard cheese. Beverages Water. Low-calorie sports drinks. Fruit juices without pulp. Strained tomato and vegetable juices. Decaffeinated teas. Sugar-free beverages not sweetened with sugar alcohols. Oral rehydration solutions, if approved by your health care provider. Seasoning and other foods Bouillon, broth, or soups made from recommended foods. What foods are not recommended? The items listed may not be a complete list. Talk with your health care provider about what dietary choices are best for you. Grains Whole   grain, whole wheat, bran, or rye breads, rolls, pastas, and crackers. Wild or brown rice. Whole grain or bran cereals. Barley.  Oats and oatmeal. Corn tortillas or taco shells. Granola. Popcorn. Vegetables Raw vegetables. Fried vegetables. Cabbage, broccoli, Brussels sprouts, artichokes, baked beans, beet greens, corn, kale, legumes, peas, sweet potatoes, and yams. Potato skins. Cooked spinach and cabbage. Fruits Dried fruit, including raisins and dates. Raw fruits. Stewed or dried prunes. Canned fruits with syrup. Meat and other protein foods Fried or fatty meats. Deli meats. Chunky nut butters. Nuts and seeds. Beans and lentils. Bacon. Hot dogs. Sausage. Dairy High-fat cheeses. Whole milk, chocolate milk, and beverages made with milk, such as milk shakes. Half-and-half. Cream. sour cream. Ice cream. Beverages Caffeinated beverages (such as coffee, tea, soda, or energy drinks). Alcoholic beverages. Fruit juices with pulp. Prune juice. Soft drinks sweetened with high-fructose corn syrup or sugar alcohols. High-calorie sports drinks. Fats and oils Butter. Cream sauces. Margarine. Salad oils. Plain salad dressings. Olives. Avocados. Mayonnaise. Sweets and desserts Sweet rolls, doughnuts, and sweet breads. Sugar-free desserts sweetened with sugar alcohols such as xylitol and sorbitol. Seasoning and other foods Honey. Hot sauce. Chili powder. Gravy. Cream-based or milk-based soups. Pancakes and waffles. Summary  When you have diarrhea, the foods you eat and your eating habits are very important.  Make sure you get at least 8-10 cups of fluid each day, or enough to keep your urine clear or pale yellow.  Eat bland foods and gradually reintroduce healthy, nutrient-rich foods as tolerated, or as told by your health care provider.  Avoid high-fiber, fried, greasy, or spicy foods. This information is not intended to replace advice given to you by your health care provider. Make sure you discuss any questions you have with your health care provider. Document Revised: 05/01/2018 Document Reviewed: 01/06/2016 Elsevier Patient  Education  2020 [employer] Inc[employer]  

## 2019-10-06 NOTE — Progress Notes (Signed)
Stephanie Booker is a 59 y.o. 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 180 tablet 3   aspirin EC 325 MG tablet Take 1 tablet (325 mg total) by mouth daily. 30 tablet 0   atorvastatin (LIPITOR) 20 MG tablet Take 1 tablet (20 mg total) by mouth daily. 90 tablet 3   escitalopram (LEXAPRO) 20 MG tablet Take 1 tablet (20 mg total) by mouth daily. 90 tablet 3   Vitamin D, Ergocalciferol, (DRISDOL) 1.25 MG (50000 UNIT) CAPS capsule Take 1 capsule (50,000 Units total) by mouth every 7 (seven) days. 12 capsule 0   No current facility-administered medications for this visit.    Allergies: Patient has no known allergies.  Past Medical History:  Diagnosis Date   Anxiety    Hyperlipidemia    OSA (obstructive sleep apnea)    Stroke [facility])    ocular stroke    Past Surgical History:  Procedure Laterality Date   ABDOMINAL HYSTERECTOMY      Family History  Problem Relation Age of Onset   Breast cancer Mother     Social History   Tobacco Use   Smoking status: Current Every Day Smoker    Packs/day: 1.00    Start date: 01/23/1975   Smokeless tobacco: Never Used  Substance Use Topics   Alcohol use: Yes    Alcohol/week: 0.0 standard drinks    Comment:  occassionally    Subjective:   I connected with Stephanie Booker on 10/06/19 at 10:40 AM EDT by a video enabled telemedicine application and verified that I am speaking with the correct person using two identifiers.   I discussed the limitations of evaluation and management by telemedicine and the availability of in person appointments. The patient expressed understanding and agreed to proceed. Provider in office/ patient is at home; provider and patient are only 2 people on video call.   2 week history of diarrhea; no fever or abdominal pain; no nausea or vomiting; recent negative COVID test through her employer; no recent antibiotics; has started taking OTC B12 supplement and prescriptive Vitamin D 2 weeks prior to onset of symptoms; feels like she has lost 5-6 pounds;    Objective:  There were no vitals filed for this visit.  Lungs: Respirations unlabored;  Neurologic: Alert and oriented; speech intact; face symmetrical;   Assessment:  1. Diarrhea, unspecified type     Plan:  ? Etiology; infection needs to be considered as well as reaction to one of supplements she has recently started; Situation is complicated as patient lives an hour from the office and does not drive; she is not sure if she can get to [place] anytime in the next 24 hours; Have asked her to hold B12 and Vitamin D; she will either go to [facility] in [place],  [place] or get her husband to bring her to [facility] for labs- order to be placed at [facility]; she understands to try and be seen or get labs in the next 24 hours if possible;  Follow-up to be determined;   No follow-ups on file.  Orders Placed This Encounter  Procedures   CBC with Differential/Platelet    Standing Status:   Future    Standing Expiration Date:   10/05/2020   Comp Met (CMET)    Standing Status:   Future    Standing Expiration Date:   10/05/2020   Amylase    Standing Status:   Future    Standing Expiration Date:   10/05/2020   Lipase    Standing  Status:   Future    Standing Expiration Date:   10/05/2020   Gastrointestinal Pathogen Panel PCR    Standing Status:   Future    Standing Expiration Date:   10/05/2020    Requested Prescriptions    No prescriptions requested or ordered in this encounter

## 2019-10-18 ENCOUNTER — Other Ambulatory Visit: Payer: Self-pay | Admitting: Internal Medicine

## 2020-02-17 ENCOUNTER — Encounter: Payer: Self-pay | Admitting: Family

## 2020-02-17 ENCOUNTER — Other Ambulatory Visit: Payer: Self-pay

## 2020-02-17 ENCOUNTER — Ambulatory Visit: Payer: BC Managed Care – PPO | Admitting: Family

## 2020-02-17 VITALS — BP 150/111 | HR 74 | Temp 97.7°F | Ht 58.5 in | Wt 146.0 lb

## 2020-02-17 DIAGNOSIS — E538 Deficiency of other specified B group vitamins: Secondary | ICD-10-CM | POA: Diagnosis not present

## 2020-02-17 DIAGNOSIS — G4730 Sleep apnea, unspecified: Secondary | ICD-10-CM | POA: Diagnosis not present

## 2020-02-17 DIAGNOSIS — I10 Essential (primary) hypertension: Secondary | ICD-10-CM | POA: Diagnosis not present

## 2020-02-17 DIAGNOSIS — R81 Glycosuria: Secondary | ICD-10-CM

## 2020-02-17 LAB — URINALYSIS, ROUTINE W REFLEX MICROSCOPIC
Bilirubin Urine: NEGATIVE
Hgb urine dipstick: NEGATIVE
Ketones, ur: NEGATIVE
Leukocytes,Ua: NEGATIVE
Nitrite: NEGATIVE
Specific Gravity, Urine: 1.02 (ref 1.000–1.030)
Total Protein, Urine: 100 — AB
Urine Glucose: NEGATIVE
Urobilinogen, UA: 0.2 (ref 0.0–1.0)
pH: 6.5 (ref 5.0–8.0)

## 2020-02-17 LAB — COMPREHENSIVE METABOLIC PANEL
ALT: 10 U/L (ref 0–35)
AST: 14 U/L (ref 0–37)
Albumin: 4.1 g/dL (ref 3.5–5.2)
Alkaline Phosphatase: 102 U/L (ref 39–117)
BUN: 15 mg/dL (ref 6–23)
CO2: 26 mEq/L (ref 19–32)
Calcium: 9.2 mg/dL (ref 8.4–10.5)
Chloride: 107 mEq/L (ref 96–112)
Creatinine, Ser: 0.73 mg/dL (ref 0.40–1.20)
GFR: 90.09 mL/min (ref >60.00)
Glucose, Bld: 92 mg/dL (ref 70–99)
Potassium: 3.7 mEq/L (ref 3.5–5.1)
Sodium: 138 mEq/L (ref 135–145)
Total Bilirubin: 0.6 mg/dL (ref 0.2–1.2)
Total Protein: 7.3 g/dL (ref 6.0–8.3)

## 2020-02-17 LAB — CBC WITH DIFFERENTIAL/PLATELET
Basophils Absolute: 0.1 10*3/uL (ref 0.0–0.1)
Basophils Relative: 1.1 % (ref 0.0–3.0)
Eosinophils Absolute: 0.1 10*3/uL (ref 0.0–0.7)
Eosinophils Relative: 1.4 % (ref 0.0–5.0)
HCT: 45.9 % (ref 36.0–46.0)
Hemoglobin: 15.8 g/dL — ABNORMAL HIGH (ref 12.0–15.0)
Lymphocytes Relative: 21.3 % (ref 12.0–46.0)
Lymphs Abs: 1.9 10*3/uL (ref 0.7–4.0)
MCHC: 34.5 g/dL (ref 30.0–36.0)
MCV: 93.7 fl (ref 78.0–100.0)
Monocytes Absolute: 0.6 10*3/uL (ref 0.1–1.0)
Monocytes Relative: 6.8 % (ref 3.0–12.0)
Neutro Abs: 6.1 10*3/uL (ref 1.4–7.7)
Neutrophils Relative %: 69.4 % (ref 43.0–77.0)
Platelets: 219 10*3/uL (ref 150.0–400.0)
RBC: 4.9 Mil/uL (ref 3.87–5.11)
RDW: 12.6 % (ref 11.5–15.5)
WBC: 8.8 10*3/uL (ref 4.0–10.5)

## 2020-02-17 LAB — HEMOGLOBIN A1C: Hgb A1c MFr Bld: 5.1 % (ref 4.6–6.5)

## 2020-02-17 LAB — VITAMIN B12: Vitamin B-12: 356 pg/mL (ref 211–911)

## 2020-02-17 MED ORDER — LOSARTAN POTASSIUM 100 MG PO TABS
100.0000 mg | ORAL_TABLET | Freq: Every day | ORAL | 0 refills | Status: DC
Start: 2020-02-17 — End: 2020-03-23

## 2020-02-17 NOTE — Progress Notes (Signed)
Stephanie Booker is a 60 y.o. female with the following history as recorded in EpicCare:  Patient Active Problem List   Diagnosis Date Noted  . Elevated LFTs 09/02/2018  . Arthralgia 08/19/2018  . OSA on CPAP 11/20/2017  . Chronic meniscal tear of knee 03/14/2015  . Routine general medical examination at a health care facility 03/03/2015  . Adjustment disorder with mixed anxiety and depressed mood 03/03/2015  . Overweight (BMI 25.0-29.9) 06/08/2014  . Tobacco use disorder 02/03/2014  . Hyperlipidemia 12/07/2013  . Essential hypertension 12/07/2013  . Pseudotumor cerebri 10/15/2013  . 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 acetaZOLAMIDE (DIAMOX) 250 MG tablet Take 1 tablet (250 mg total) by mouth 2 (two) times daily. 180 tablet 3  . aspirin EC 325 MG tablet Take 1 tablet (325 mg total) by mouth daily. 30 tablet 0  . atorvastatin (LIPITOR) 20 MG tablet Take 1 tablet (20 mg total) by mouth daily. 90 tablet 3  . escitalopram (LEXAPRO) 20 MG tablet Take 1 tablet (20 mg total) by mouth daily. 90 tablet 3  . fenofibrate 160 MG tablet Take by mouth.    . losartan (COZAAR) 100 MG tablet Take 1 tablet (100 mg total) by mouth daily. 90 tablet 0  . mesalamine (LIALDA) 1.2 g EC tablet Take 3 tablets by mouth daily.    . potassium chloride (KLOR-CON) 10 MEQ tablet Take 1 tablet by mouth daily.    . prednisoLONE acetate (PRED FORTE) 1 % ophthalmic suspension      No current facility-administered medications for this visit.    Allergies: Patient has no known allergies.  Past Medical History:  Diagnosis Date  . Anxiety   . Hyperlipidemia   . OSA (obstructive sleep apnea)   . Stroke [facility])    ocular stroke    Past Surgical History:  Procedure Laterality Date  . ABDOMINAL HYSTERECTOMY      Family History  Problem Relation Age of Onset  . Breast cancer Mother     Social History   Tobacco Use  . Smoking  status: Current Every Day Smoker    Packs/day: 1.00    Start date: 01/23/1975  . Smokeless tobacco: Never Used  Substance Use Topics  . Alcohol use: Yes    Alcohol/week: 0.0 standard drinks    Comment: occassionally    Subjective:  Patient had health screening at work- blood pressure was 180/99; patient notes she has taken blood pressure medication in the past but was stopped at some point; Has been having increased fatigue recently; has been having headaches; denies any chest pain or shortness of breath; denies any dizziness; has chronic vision issues;  Has started Lialda in the past 2 months- patient is questioning if the Lialda could be raising her blood pressure; had colonoscopy 3 months ago- no problems with blood pressure at time of colonoscopy Does have sleep apnea- not wearing her CPAP machine; managed by provider in [place], [place]- knows she needs to follow-up there for repeat test; History of low B12- wonders if this can be re-checked today due to persisting fatigue; oral supplementation was recommended at last check in July 2021;     Objective:  Vitals:   02/17/20 1215  BP: (!) 150/111  Pulse: 74  Temp: 97.7 F (36.5 C)  TempSrc: Oral  SpO2: 97%  Weight: 146 lb (66.2 kg)  Height: 4' 10.5" (1.486 m)    General: Well developed, well  nourished, in no acute distress  Skin : Warm and dry.  Head: Normocephalic and atraumatic  Lungs: Respirations unlabored; clear to auscultation bilaterally without wheeze, rales, rhonchi  CVS exam: normal rate and regular rhythm.  Musculoskeletal: No deformities; no active joint inflammation  Extremities: No edema, cyanosis, clubbing  Vessels: Symmetric bilaterally  Neurologic: Alert and oriented; speech intact; face symmetrical; moves all extremities well; CNII-XII intact without focal deficit    Assessment:  1. Essential hypertension   2. Glycosuria   3. Low vitamin B12 level   4. Sleep apnea, unspecified type     Plan:  1. EKG done-  shows NSR; check CBC, CMP today; start Losartan 100 mg daily; close follow-up with her PCP; follow-up in 1 week; start medication today;  2. Check U/A and Hgba1c; 3. Check B12 level; 4. Needs to call her sleep specialist and get new sleep study done;   She also needs to call her GI to ask about possible side effects from Lialda- do not think this is source of elevated blood pressure;   This visit occurred during the SARS-CoV-2 public health emergency.  Safety protocols were in place, including screening questions prior to the visit, additional usage of staff PPE, and extensive cleaning of exam room while observing appropriate contact time as indicated for disinfecting solutions.    Return in about 1 week (around 02/24/2020) for with Dr. Sharlet Salina.  Orders Placed This Encounter  Procedures  . CBC with Differential/Platelet    Standing Status:   Future    Number of Occurrences:   1    Standing Expiration Date:   02/16/2021  . Comp Met (CMET)    Standing Status:   Future    Number of Occurrences:   1    Standing Expiration Date:   02/16/2021  . Hemoglobin A1c    Standing Status:   Future    Number of Occurrences:   1    Standing Expiration Date:   02/16/2021  . Urinalysis, Routine w reflex microscopic    Standing Status:   Future    Number of Occurrences:   1    Standing Expiration Date:   02/16/2021  . Vitamin B12    Standing Status:   Future    Number of Occurrences:   1    Standing Expiration Date:   02/16/2021  . EKG 12-Lead    Order Specific Question:   Where should this test be performed?    Answer:   Other    Requested Prescriptions   Signed Prescriptions Disp Refills  . losartan (COZAAR) 100 MG tablet 90 tablet 0    Sig: Take 1 tablet (100 mg total) by mouth daily.

## 2020-02-25 ENCOUNTER — Encounter: Payer: Self-pay | Admitting: Internal Medicine

## 2020-02-25 ENCOUNTER — Other Ambulatory Visit: Payer: Self-pay

## 2020-02-25 ENCOUNTER — Ambulatory Visit: Payer: BC Managed Care – PPO | Admitting: Internal Medicine

## 2020-02-25 VITALS — BP 140/94 | HR 75 | Temp 98.1°F | Resp 18 | Ht 58.5 in | Wt 145.4 lb

## 2020-02-25 DIAGNOSIS — I10 Essential (primary) hypertension: Secondary | ICD-10-CM | POA: Diagnosis not present

## 2020-02-25 DIAGNOSIS — F4323 Adjustment disorder with mixed anxiety and depressed mood: Secondary | ICD-10-CM | POA: Diagnosis not present

## 2020-02-25 DIAGNOSIS — E538 Deficiency of other specified B group vitamins: Secondary | ICD-10-CM

## 2020-02-25 LAB — COMPREHENSIVE METABOLIC PANEL
ALT: 13 U/L (ref 0–35)
AST: 15 U/L (ref 0–37)
Albumin: 4.3 g/dL (ref 3.5–5.2)
Alkaline Phosphatase: 118 U/L — ABNORMAL HIGH (ref 39–117)
BUN: 17 mg/dL (ref 6–23)
CO2: 23 mEq/L (ref 19–32)
Calcium: 9.5 mg/dL (ref 8.4–10.5)
Chloride: 108 mEq/L (ref 96–112)
Creatinine, Ser: 0.77 mg/dL (ref 0.40–1.20)
GFR: 84.49 mL/min (ref >60.00)
Glucose, Bld: 60 mg/dL — ABNORMAL LOW (ref 70–99)
Potassium: 3.7 mEq/L (ref 3.5–5.1)
Sodium: 138 mEq/L (ref 135–145)
Total Bilirubin: 0.5 mg/dL (ref 0.2–1.2)
Total Protein: 7.6 g/dL (ref 6.0–8.3)

## 2020-02-25 LAB — TSH: TSH: 1.96 u[IU]/mL (ref 0.35–4.50)

## 2020-02-25 LAB — VITAMIN D 25 HYDROXY (VIT D DEFICIENCY, FRACTURES): VITD: 41.11 ng/mL (ref 30.00–100.00)

## 2020-02-25 MED ORDER — CYANOCOBALAMIN 1000 MCG/ML IJ SOLN
1000.0000 ug | Freq: Once | INTRAMUSCULAR | Status: AC
  Administered 2020-02-25: 1000 ug via INTRAMUSCULAR

## 2020-02-25 MED ORDER — AMLODIPINE BESYLATE 10 MG PO TABS: 10.0000 mg | ORAL_TABLET | Freq: Every day | ORAL | 3 refills | Status: DC

## 2020-02-25 NOTE — Progress Notes (Signed)
   Subjective:   Patient ID: Stephanie Booker, female    DOB: [birth year], 60 y.o.   MRN: [id number]  HPI The patient is a 60 YO female coming in for fatigue (worried due to the past hepatitis b which is now resolved, she is more stressed recently, not sure if her vitamin levels are low, recent HgA1c without pre-diabetes) and blood pressure (BP running slightly higher recently, was newly prescribed losartan 100 mg daily and feels it is not enough for her blood pressure, under more stress, denies chest pains, mild headaches at times), and her mood (she is caring for mother and husband recently lost job in December, she is under a lot of stress financially, is taking lexapro 20 mg daily and this helps, she is not sure she wants to change, cannot afford counseling, does not have time for self care).   Review of Systems  Constitutional: Positive for activity change and fatigue.  HENT: Negative.   Eyes: Negative.   Respiratory: Negative for cough, chest tightness and shortness of breath.   Cardiovascular: Negative for chest pain, palpitations and leg swelling.  Gastrointestinal: Negative for abdominal distention, abdominal pain, constipation, diarrhea, nausea and vomiting.  Musculoskeletal: Positive for myalgias.  Skin: Negative.   Neurological: Negative.   Psychiatric/Behavioral: Positive for decreased concentration, dysphoric mood and sleep disturbance.    Objective:  Physical Exam Constitutional:      Appearance: She is well-developed and well-nourished.  HENT:     Head: Normocephalic and atraumatic.  Eyes:     Extraocular Movements: EOM normal.  Cardiovascular:     Rate and Rhythm: Normal rate and regular rhythm.  Pulmonary:     Effort: Pulmonary effort is normal. No respiratory distress.     Breath sounds: Normal breath sounds. No wheezing or rales.  Abdominal:     General: Bowel sounds are normal. There is no distension.     Palpations: Abdomen is soft.     Tenderness: There is no  abdominal tenderness. There is no rebound.  Musculoskeletal:        General: No edema.     Cervical back: Normal range of motion.  Skin:    General: Skin is warm and dry.  Neurological:     Mental Status: She is alert and oriented to person, place, and time.     Coordination: Coordination normal.  Psychiatric:        Mood and Affect: Mood and affect normal.     Vitals:   02/25/20 1036  BP: (!) 140/94  Pulse: 75  Resp: 18  Temp: 98.1 F (36.7 C)  TempSrc: Oral  SpO2: 97%  Weight: 145 lb 6.4 oz (66 kg)  Height: 4' 10.5" (1.486 m)    This visit occurred during the SARS-CoV-2 public health emergency.  Safety protocols were in place, including screening questions prior to the visit, additional usage of staff PPE, and extensive cleaning of exam room while observing appropriate contact time as indicated for disinfecting solutions.   Assessment & Plan:  B12 shot given at visit

## 2020-02-25 NOTE — Patient Instructions (Addendum)
We will check the labs today.  We will add amlodipine to take 1 pill daily to help the blood pressure come down.  It is okay to stay off the potassium and the fenofibrate.

## 2020-02-26 DIAGNOSIS — E538 Deficiency of other specified B group vitamins: Secondary | ICD-10-CM | POA: Insufficient documentation

## 2020-02-26 NOTE — Assessment & Plan Note (Signed)
B12 level on low end of normal recently. Given B12 injection today to see if this helps symptomatically.

## 2020-02-26 NOTE — Assessment & Plan Note (Signed)
BP elevated and she is taking diamox and losartan 100 mg daily (added about 1-2 weeks ago). Will add amlodipine 10 mg daily and she wants to work on weight and diet with the intention of stopping amlodipine once BP stabilized. Checking CMP and CBC and adjust as needed.

## 2020-02-26 NOTE — Assessment & Plan Note (Signed)
Suspect that her stress is contributing to her fatigue and lack of energy. She is taking lexapro 20 mg daily and we discussed adding agent but she declines today. She does not have time or money for counseling at this time. We talked about making time for self care activities to help with her overall well being.

## 2020-03-23 ENCOUNTER — Other Ambulatory Visit: Payer: Self-pay | Admitting: Family

## 2020-05-19 ENCOUNTER — Telehealth: Payer: Self-pay | Admitting: Internal Medicine

## 2020-05-19 MED ORDER — LOSARTAN POTASSIUM 100 MG PO TABS: 1.0000 | ORAL_TABLET | Freq: Every day | ORAL | 3 refills | Status: DC

## 2020-05-19 NOTE — Telephone Encounter (Signed)
1.Medication Requested: losartan (COZAAR) 100 MG tablet    2. Pharmacy (Name, Street, [place]): [id number] - [place], [place] - [address]  3. On Med List: yes   4. Last Visit with PCP: 02-25-20  5. Next visit date with PCP: 06-17-20  Patient is requesting a 90 day supply. She said that she did not get a 90 day supply last time and she is currently out.    Agent: Please be advised that RX refills may take up to 3 business days. We ask that you follow-up with your pharmacy.

## 2020-05-19 NOTE — Telephone Encounter (Signed)
Medication has been sent to the patient's pharmacy.  

## 2020-05-24 ENCOUNTER — Ambulatory Visit: Payer: BC Managed Care – PPO | Admitting: Internal Medicine

## 2020-06-17 ENCOUNTER — Ambulatory Visit: Payer: BC Managed Care – PPO | Admitting: Internal Medicine

## 2020-09-05 ENCOUNTER — Other Ambulatory Visit: Payer: Self-pay | Admitting: Internal Medicine

## 2020-10-13 ENCOUNTER — Other Ambulatory Visit: Payer: Self-pay | Admitting: Internal Medicine

## 2020-10-26 ENCOUNTER — Other Ambulatory Visit: Payer: Self-pay | Admitting: Internal Medicine

## 2020-10-27 ENCOUNTER — Telehealth: Payer: Self-pay | Admitting: Internal Medicine

## 2020-10-27 NOTE — Telephone Encounter (Signed)
1.Medication Requested: escitalopram (LEXAPRO) 20 MG tablet  2. Pharmacy (Name, Street, [place]): [id number] - [place], [place] - [address]  Phone:  [phone] Fax:  [phone]   3. On Med List: yes  4. Last Visit with PCP: 02.03.22  5. Next visit date with PCP: 10.26.22   Agent: Please be advised that RX refills may take up to 3 business days. We ask that you follow-up with your pharmacy.

## 2020-10-28 MED ORDER — ESCITALOPRAM OXALATE 20 MG PO TABS: 20.0000 mg | ORAL_TABLET | Freq: Every day | ORAL | 3 refills | Status: DC

## 2020-10-28 NOTE — Telephone Encounter (Signed)
Refill has been sent to the patients's pharmacy.

## 2020-10-28 NOTE — Telephone Encounter (Signed)
Okay to refill? 

## 2020-11-07 ENCOUNTER — Other Ambulatory Visit: Payer: Self-pay | Admitting: Internal Medicine

## 2020-11-14 ENCOUNTER — Telehealth: Payer: Self-pay | Admitting: Internal Medicine

## 2020-11-14 NOTE — Telephone Encounter (Signed)
Patient calling to inform she is returning a call from Dr. Frutoso Chase office  Advised patient I did not see a message notate in her chart  Advise patient she has an upcoming appointment on 11-16-2020

## 2020-11-15 NOTE — Patient Instructions (Addendum)
    Blood work was ordered.     Medications changes include :  none   Your prescription(s) have been submitted to your pharmacy. Please take as directed and contact our office if you believe you are having problem(s) with the medication(s).   Please followup in 6 months  

## 2020-11-15 NOTE — Progress Notes (Signed)
Subjective:    Patient ID: Stephanie Booker, female    DOB: 22-Sep-1960, 60 y.o.   MRN: [id number]  This visit occurred during the SARS-CoV-2 public health emergency.  Safety protocols were in place, including screening questions prior to the visit, additional usage of staff PPE, and extensive cleaning of exam room while observing appropriate contact time as indicated for disinfecting solutions.     HPI The patient is here for follow up of their chronic medical problems, including htn, hld, anxiety, depression  Fatigue-she states fatigue.  She is not currently taking vitamin B12 or vitamin D supplements.  She is not currently on CPAP.  She sleeps maybe 6 hrs - broken sleep.    She is still smoking.  She is not motivated to quit.  Medications and allergies reviewed with patient and updated if appropriate.  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 90 tablet 3   aspirin EC 325 MG tablet Take 1 tablet (325 mg total) by mouth daily. 30 tablet 0   atorvastatin (LIPITOR) 20 MG tablet TAKE 1 TABLET BY MOUTH EVERY DAY 90 tablet 3   escitalopram (LEXAPRO) 20 MG tablet Take 1 tablet (20 mg total) by mouth daily. 90 tablet 3   losartan (COZAAR) 100 MG tablet  Take 1 tablet (100 mg total) by mouth daily. 90 tablet 3   mesalamine (LIALDA) 1.2 g EC tablet Take 3 tablets by mouth daily.     No current facility-administered medications on file prior to visit.    Past Medical History:  Diagnosis Date   Anxiety    Hyperlipidemia    OSA (obstructive sleep apnea)    Stroke (HCC)    ocular stroke    Past Surgical History:  Procedure Laterality Date   ABDOMINAL HYSTERECTOMY      Social History   Socioeconomic History   Marital status: Married    Spouse name: Not on file   Number of children: 0   Years of education: 12th   Highest education level: Not on file  Occupational History   Occupation: cna    Employer: [employer]  Tobacco Use   Smoking status: Every Day    Packs/day: 1.00    Types: Cigarettes    Start date: 01/23/1975   Smokeless tobacco: Never  Substance and Sexual Activity   Alcohol use: Yes    Alcohol/week: 0.0 standard drinks    Comment: occassionally   Drug use: No   Sexual activity: Yes  Other Topics Concern   Not on file  Social History Narrative   Patient lives at home with her husband    Patient is right handed   Patient coffee daily   Social Determinants of Health   Financial Resource Strain:  Not on file  Food Insecurity: Not on file  Transportation Needs: Not on file  Physical Activity: Not on file  Stress: Not on file  Social Connections: Not on file    Family History  Problem Relation Age of Onset   Breast cancer Mother     Review of Systems  Constitutional:  Positive for diaphoresis (night only). Negative for chills and fever.  Respiratory:  Negative for cough, shortness of breath and wheezing.   Cardiovascular:  Negative for chest pain, palpitations and leg swelling.  Neurological:  Negative for light-headedness and headaches.      Objective:   Vitals:   11/16/20 1140  BP: 140/84  Pulse: 76  Temp: 98.4 F (36.9 C)  SpO2: 94%   BP Readings from Last 3 Encounters:  11/16/20 140/84   02/25/20 (!) 140/94  02/17/20 (!) 150/111   Wt Readings from Last 3 Encounters:  11/16/20 147 lb (66.7 kg)  02/25/20 145 lb 6.4 oz (66 kg)  02/17/20 146 lb (66.2 kg)   Body mass index is 30.2 kg/m.   Physical Exam    Constitutional: Appears well-developed and well-nourished. No distress.  HENT:  Head: Normocephalic and atraumatic.  Neck: Neck supple. No tracheal deviation present. No thyromegaly present.  No cervical lymphadenopathy Cardiovascular: Normal rate, regular rhythm and normal heart sounds.   No murmur heard. No carotid bruit .  No edema Pulmonary/Chest: Effort normal and breath sounds normal. No respiratory distress. No has no wheezes. No rales.  Skin: Skin is warm and dry. Not diaphoretic.  Psychiatric: Normal mood and affect. Behavior is normal.      Assessment & Plan:    See Problem List for Assessment and Plan of chronic medical problems.

## 2020-11-16 ENCOUNTER — Ambulatory Visit: Payer: BC Managed Care – PPO | Admitting: Internal Medicine

## 2020-11-16 ENCOUNTER — Encounter: Payer: Self-pay | Admitting: Internal Medicine

## 2020-11-16 ENCOUNTER — Other Ambulatory Visit: Payer: Self-pay

## 2020-11-16 VITALS — BP 140/84 | HR 76 | Temp 98.4°F | Ht 58.5 in | Wt 147.0 lb

## 2020-11-16 DIAGNOSIS — I10 Essential (primary) hypertension: Secondary | ICD-10-CM | POA: Diagnosis not present

## 2020-11-16 DIAGNOSIS — F4323 Adjustment disorder with mixed anxiety and depressed mood: Secondary | ICD-10-CM

## 2020-11-16 DIAGNOSIS — E538 Deficiency of other specified B group vitamins: Secondary | ICD-10-CM

## 2020-11-16 DIAGNOSIS — E782 Mixed hyperlipidemia: Secondary | ICD-10-CM

## 2020-11-16 DIAGNOSIS — E559 Vitamin D deficiency, unspecified: Secondary | ICD-10-CM | POA: Insufficient documentation

## 2020-11-16 DIAGNOSIS — R5383 Other fatigue: Secondary | ICD-10-CM | POA: Insufficient documentation

## 2020-11-16 LAB — LDL CHOLESTEROL, DIRECT: Direct LDL: 110 mg/dL

## 2020-11-16 LAB — LIPID PANEL
Cholesterol: 202 mg/dL — ABNORMAL HIGH (ref 0–200)
HDL: 39.8 mg/dL (ref >39.00)
NonHDL: 161.77
Total CHOL/HDL Ratio: 5
Triglycerides: 295 mg/dL — ABNORMAL HIGH (ref 0.0–149.0)
VLDL: 59 mg/dL — ABNORMAL HIGH (ref 0.0–40.0)

## 2020-11-16 LAB — COMPREHENSIVE METABOLIC PANEL
ALT: 13 U/L (ref 0–35)
AST: 4 U/L (ref 0–37)
Albumin: 4.3 g/dL (ref 3.5–5.2)
Alkaline Phosphatase: 109 U/L (ref 39–117)
BUN: 25 mg/dL — ABNORMAL HIGH (ref 6–23)
CO2: 24 mEq/L (ref 19–32)
Calcium: 9.1 mg/dL (ref 8.4–10.5)
Chloride: 107 mEq/L (ref 96–112)
Creatinine, Ser: 1.17 mg/dL (ref 0.40–1.20)
GFR: 50.88 mL/min — ABNORMAL LOW (ref >60.00)
Glucose, Bld: 124 mg/dL — ABNORMAL HIGH (ref 70–99)
Potassium: 3.3 mEq/L — ABNORMAL LOW (ref 3.5–5.1)
Sodium: 139 mEq/L (ref 135–145)
Total Bilirubin: 0.5 mg/dL (ref 0.2–1.2)
Total Protein: 7.2 g/dL (ref 6.0–8.3)

## 2020-11-16 LAB — VITAMIN B12: Vitamin B-12: 356 pg/mL (ref 211–911)

## 2020-11-16 LAB — VITAMIN D 25 HYDROXY (VIT D DEFICIENCY, FRACTURES): VITD: 18.27 ng/mL — ABNORMAL LOW (ref 30.00–100.00)

## 2020-11-16 MED ORDER — ACETAZOLAMIDE 250 MG PO TABS: 250.0000 mg | ORAL_TABLET | Freq: Two times a day (BID) | ORAL | 3 refills | Status: DC

## 2020-11-16 NOTE — Assessment & Plan Note (Signed)
Chronic Not currently taking vitamin B12 Will check B12 level Discussed that she needs to take B12 on a regular basis and may not see the effects of that for a while

## 2020-11-16 NOTE — Assessment & Plan Note (Signed)
Chronic Blood pressure controlled CMP Continue amlodipine 10 mg daily, losartan 100 mg daily

## 2020-11-16 NOTE — Assessment & Plan Note (Signed)
Chronic Not currently taking vitamin D Will check vitamin D level Will consider high-dose vitamin D, but then she needs to take OTC vitamin D regularly

## 2020-11-16 NOTE — Assessment & Plan Note (Signed)
New Likely multifactorial Discussed that she should ideally get more sleep Sleep apnea and not currently on CPAP-discussed this is likely contributing to her fatigue.  She will work on getting back her CPAP machine, which she did like using Has B12 deficiency and vitamin D deficiency-both may be contributing as well Will check levels Advise she needs to take over-the-counter vitamin D and B12 consistently-we will consider high-dose vitamin D if the level is low, but then discussed she needs to take over-the-counter daily

## 2020-11-16 NOTE — Assessment & Plan Note (Signed)
Chronic ?Controlled, Stable ?Continue Lexapro 20 mg daily ?

## 2020-11-16 NOTE — Assessment & Plan Note (Signed)
Chronic °Regular exercise and healthy diet encouraged °Check lipid panel  °Continue atorvastatin 20 mg daily °

## 2020-11-17 MED ORDER — VITAMIN D (ERGOCALCIFEROL) 1.25 MG (50000 UNIT) PO CAPS: 50000.0000 [IU] | ORAL_CAPSULE | ORAL | 0 refills | Status: DC

## 2020-11-17 NOTE — Addendum Note (Signed)
Addended by: Pincus Sanes on: 11/17/2020 07:28 AM   Modules accepted: Orders

## 2021-01-21 ENCOUNTER — Other Ambulatory Visit: Payer: Self-pay | Admitting: Internal Medicine

## 2021-03-07 ENCOUNTER — Other Ambulatory Visit: Payer: Self-pay | Admitting: Internal Medicine

## 2021-03-14 ENCOUNTER — Other Ambulatory Visit: Payer: Self-pay | Admitting: Internal Medicine

## 2021-03-15 ENCOUNTER — Other Ambulatory Visit: Payer: Self-pay | Admitting: Internal Medicine

## 2021-04-14 ENCOUNTER — Ambulatory Visit: Payer: BC Managed Care – PPO | Admitting: Internal Medicine

## 2021-04-14 ENCOUNTER — Other Ambulatory Visit: Payer: Self-pay

## 2021-04-14 ENCOUNTER — Encounter: Payer: Self-pay | Admitting: Internal Medicine

## 2021-04-14 VITALS — BP 102/62 | HR 77 | Temp 97.8°F | Ht 58.5 in | Wt 146.0 lb

## 2021-04-14 DIAGNOSIS — H547 Unspecified visual loss: Secondary | ICD-10-CM

## 2021-04-14 DIAGNOSIS — E559 Vitamin D deficiency, unspecified: Secondary | ICD-10-CM

## 2021-04-14 DIAGNOSIS — G4733 Obstructive sleep apnea (adult) (pediatric): Secondary | ICD-10-CM | POA: Diagnosis not present

## 2021-04-14 DIAGNOSIS — Z9989 Dependence on other enabling machines and devices: Secondary | ICD-10-CM

## 2021-04-14 DIAGNOSIS — F17200 Nicotine dependence, unspecified, uncomplicated: Secondary | ICD-10-CM | POA: Diagnosis not present

## 2021-04-14 DIAGNOSIS — R809 Proteinuria, unspecified: Secondary | ICD-10-CM | POA: Diagnosis not present

## 2021-04-14 MED ORDER — VITAMIN D (ERGOCALCIFEROL) 1.25 MG (50000 UNIT) PO CAPS: 50000.0000 [IU] | ORAL_CAPSULE | ORAL | 2 refills | Status: DC

## 2021-04-14 NOTE — Assessment & Plan Note (Signed)
Refilled vitamin D 83419 units weekly #8 with 2 refills ?

## 2021-04-14 NOTE — Progress Notes (Signed)
? ?  Subjective:  ? ?Patient ID: Stephanie Booker, female    DOB: May 19, 1960, 61 y.o.   MRN: [id number] ? ?HPI ?The patient is a 61 YO female coming in for new concerns. ? ?Review of Systems  ?Constitutional: Negative.   ?HENT: Negative.    ?Eyes:  Positive for visual disturbance.  ?Respiratory:  Negative for cough, chest tightness and shortness of breath.   ?Cardiovascular:  Negative for chest pain, palpitations and leg swelling.  ?Gastrointestinal:  Negative for abdominal distention, abdominal pain, constipation, diarrhea, nausea and vomiting.  ?Musculoskeletal: Negative.   ?Skin: Negative.   ?Neurological: Negative.   ?Psychiatric/Behavioral: Negative.    ? ?Objective:  ?Physical Exam ?Constitutional:   ?   Appearance: She is well-developed.  ?HENT:  ?   Head: Normocephalic and atraumatic.  ?Eyes:  ?   Extraocular Movements: Extraocular movements intact.  ?   Pupils: Pupils are equal, round, and reactive to light.  ?Cardiovascular:  ?   Rate and Rhythm: Normal rate and regular rhythm.  ?Pulmonary:  ?   Effort: Pulmonary effort is normal. No respiratory distress.  ?   Breath sounds: Normal breath sounds. No wheezing or rales.  ?Abdominal:  ?   General: Bowel sounds are normal. There is no distension.  ?   Palpations: Abdomen is soft.  ?   Tenderness: There is no abdominal tenderness. There is no rebound.  ?Musculoskeletal:  ?   Cervical back: Normal range of motion.  ?Skin: ?   General: Skin is warm and dry.  ?Neurological:  ?   Mental Status: She is alert and oriented to person, place, and time.  ?   Coordination: Coordination normal.  ? ? ?Vitals:  ? 04/14/21 1032  ?BP: 102/62  ?Pulse: 77  ?Temp: 97.8 ?F (36.6 ?C)  ?TempSrc: Oral  ?SpO2: 95%  ?Height: 4' 10.5" (1.486 m)  ? ? ?This visit occurred during the SARS-CoV-2 public health emergency.  Safety protocols were in place, including screening questions prior to the visit, additional usage of staff PPE, and extensive cleaning of exam room while observing appropriate  contact time as indicated for disinfecting solutions.  ? ?Assessment & Plan:  ? ?

## 2021-04-14 NOTE — Assessment & Plan Note (Signed)
Reviewed recent workup with nephrology. Patient does not want to return to nephrology. BP is controlled and on maximum dose losartan 100 mg daily which we will continue. She does not have known diabetes. Ordered US renal to help with evaluation since she has not prior renal imaging.  ?

## 2021-04-14 NOTE — Assessment & Plan Note (Signed)
She is off CPAP due to machine breaking. Her insurance is requiring new sleep study so we have ordered this. Then we can get her a new CPAP machine. ?

## 2021-04-14 NOTE — Assessment & Plan Note (Signed)
MRI angio head ordered to assess for stenosis. Her eye doctor recently saw her and wanted this ordered.  ?

## 2021-04-14 NOTE — Assessment & Plan Note (Signed)
Advised to quit smoking and reminded about risk/harm from smoking.  ?

## 2021-04-28 ENCOUNTER — Ambulatory Visit
Admission: RE | Admit: 2021-04-28 | Discharge: 2021-04-28 | Disposition: A | Discharge status: Home / Self Care | Payer: BC Managed Care – PPO | Source: Ambulatory Visit | Attending: Internal Medicine | Admitting: Internal Medicine

## 2021-04-28 DIAGNOSIS — R809 Proteinuria, unspecified: Secondary | ICD-10-CM

## 2021-04-28 DIAGNOSIS — H547 Unspecified visual loss: Secondary | ICD-10-CM

## 2021-05-02 ENCOUNTER — Telehealth: Payer: Self-pay

## 2021-05-02 DIAGNOSIS — N2889 Other specified disorders of kidney and ureter: Secondary | ICD-10-CM

## 2021-05-02 NOTE — Telephone Encounter (Signed)
Pt called in for results of MRI and [place]. I advised the pt of Dr. Frutoso Chase results notes MRI showed no problems and no aneurysm and the US renal There is a kidney stone and a spot which is likely a cyst but they cannot see well. We should get a CT of the kidneys to check this spot.  ? ?Pt is agreeable with the CT of the kidney for more evaluation. ? ? ?

## 2021-05-03 ENCOUNTER — Other Ambulatory Visit: Payer: Self-pay | Admitting: Internal Medicine

## 2021-05-03 NOTE — Telephone Encounter (Signed)
Ordered

## 2021-05-18 ENCOUNTER — Ambulatory Visit: Payer: BC Managed Care – PPO | Admitting: Internal Medicine

## 2021-05-26 ENCOUNTER — Ambulatory Visit
Admission: RE | Admit: 2021-05-26 | Discharge: 2021-05-26 | Disposition: A | Discharge status: Home / Self Care | Payer: BC Managed Care – PPO | Source: Ambulatory Visit | Attending: Internal Medicine | Admitting: Internal Medicine

## 2021-05-26 DIAGNOSIS — N2889 Other specified disorders of kidney and ureter: Secondary | ICD-10-CM

## 2021-05-26 MED ORDER — IOPAMIDOL (ISOVUE-300) INJECTION 61%
100.0000 mL | Freq: Once | INTRAVENOUS | Status: AC | PRN
  Administered 2021-05-26: 100 mL via INTRAVENOUS

## 2021-05-29 ENCOUNTER — Encounter: Payer: BC Managed Care – PPO | Admitting: Internal Medicine

## 2021-06-12 ENCOUNTER — Encounter: Payer: BC Managed Care – PPO | Admitting: Internal Medicine

## 2021-07-21 DIAGNOSIS — R509 Fever, unspecified: Secondary | ICD-10-CM | POA: Insufficient documentation

## 2021-07-21 DIAGNOSIS — F32A Depression, unspecified: Secondary | ICD-10-CM | POA: Insufficient documentation

## 2021-07-21 DIAGNOSIS — R059 Cough, unspecified: Secondary | ICD-10-CM | POA: Insufficient documentation

## 2021-07-26 DIAGNOSIS — R109 Unspecified abdominal pain: Secondary | ICD-10-CM | POA: Insufficient documentation

## 2021-08-02 ENCOUNTER — Other Ambulatory Visit: Payer: Self-pay | Admitting: Internal Medicine

## 2021-08-17 ENCOUNTER — Ambulatory Visit: Payer: BC Managed Care – PPO | Admitting: Internal Medicine

## 2021-08-17 ENCOUNTER — Telehealth: Payer: Self-pay

## 2021-08-17 ENCOUNTER — Encounter: Payer: Self-pay | Admitting: Internal Medicine

## 2021-08-17 VITALS — BP 118/80 | HR 72 | Resp 18 | Ht 58.5 in | Wt 149.4 lb

## 2021-08-17 DIAGNOSIS — E669 Obesity, unspecified: Secondary | ICD-10-CM

## 2021-08-17 DIAGNOSIS — E782 Mixed hyperlipidemia: Secondary | ICD-10-CM | POA: Diagnosis not present

## 2021-08-17 DIAGNOSIS — I10 Essential (primary) hypertension: Secondary | ICD-10-CM

## 2021-08-17 DIAGNOSIS — R7989 Other specified abnormal findings of blood chemistry: Secondary | ICD-10-CM | POA: Diagnosis not present

## 2021-08-17 DIAGNOSIS — F4323 Adjustment disorder with mixed anxiety and depressed mood: Secondary | ICD-10-CM | POA: Diagnosis not present

## 2021-08-17 DIAGNOSIS — Z0000 Encounter for general adult medical examination without abnormal findings: Secondary | ICD-10-CM | POA: Diagnosis not present

## 2021-08-17 DIAGNOSIS — F17200 Nicotine dependence, unspecified, uncomplicated: Secondary | ICD-10-CM

## 2021-08-17 LAB — LIPID PANEL
Cholesterol: 179 mg/dL (ref 0–200)
HDL: 33.4 mg/dL — ABNORMAL LOW (ref >39.00)
NonHDL: 145.85
Total CHOL/HDL Ratio: 5
Triglycerides: 315 mg/dL — ABNORMAL HIGH (ref 0.0–149.0)
VLDL: 63 mg/dL — ABNORMAL HIGH (ref 0.0–40.0)

## 2021-08-17 LAB — CBC
HCT: 40.9 % (ref 36.0–46.0)
Hemoglobin: 14.1 g/dL (ref 12.0–15.0)
MCHC: 34.3 g/dL (ref 30.0–36.0)
MCV: 91.7 fl (ref 78.0–100.0)
Platelets: 186 10*3/uL (ref 150.0–400.0)
RBC: 4.47 Mil/uL (ref 3.87–5.11)
RDW: 13.1 % (ref 11.5–15.5)
WBC: 7.6 10*3/uL (ref 4.0–10.5)

## 2021-08-17 LAB — COMPREHENSIVE METABOLIC PANEL
ALT: 17 U/L (ref 0–35)
AST: 15 U/L (ref 0–37)
Albumin: 4.2 g/dL (ref 3.5–5.2)
Alkaline Phosphatase: 112 U/L (ref 39–117)
BUN: 25 mg/dL — ABNORMAL HIGH (ref 6–23)
CO2: 23 mEq/L (ref 19–32)
Calcium: 8.8 mg/dL (ref 8.4–10.5)
Chloride: 107 mEq/L (ref 96–112)
Creatinine, Ser: 0.75 mg/dL (ref 0.40–1.20)
GFR: 86.31 mL/min (ref >60.00)
Glucose, Bld: 90 mg/dL (ref 70–99)
Potassium: 3.9 mEq/L (ref 3.5–5.1)
Sodium: 138 mEq/L (ref 135–145)
Total Bilirubin: 0.4 mg/dL (ref 0.2–1.2)
Total Protein: 7.3 g/dL (ref 6.0–8.3)

## 2021-08-17 LAB — LDL CHOLESTEROL, DIRECT: Direct LDL: 78 mg/dL

## 2021-08-17 LAB — HEMOGLOBIN A1C: Hgb A1c MFr Bld: 5.7 % (ref 4.6–6.5)

## 2021-08-17 MED ORDER — SEMAGLUTIDE-WEIGHT MANAGEMENT 1.7 MG/0.75ML ~~LOC~~ SOAJ: 1.7000 mg | SUBCUTANEOUS | 0 refills | Status: DC

## 2021-08-17 MED ORDER — SEMAGLUTIDE-WEIGHT MANAGEMENT 2.4 MG/0.75ML ~~LOC~~ SOAJ: 2.4000 mg | SUBCUTANEOUS | 0 refills | Status: DC

## 2021-08-17 MED ORDER — SEMAGLUTIDE-WEIGHT MANAGEMENT 0.5 MG/0.5ML ~~LOC~~ SOAJ: 0.5000 mg | SUBCUTANEOUS | 0 refills | Status: DC

## 2021-08-17 MED ORDER — SEMAGLUTIDE-WEIGHT MANAGEMENT 0.25 MG/0.5ML ~~LOC~~ SOAJ: 0.2500 mg | SUBCUTANEOUS | 0 refills | Status: DC

## 2021-08-17 MED ORDER — SEMAGLUTIDE-WEIGHT MANAGEMENT 1 MG/0.5ML ~~LOC~~ SOAJ: 1.0000 mg | SUBCUTANEOUS | 0 refills | Status: DC

## 2021-08-17 NOTE — Progress Notes (Signed)
   Subjective:   Patient ID: Stephanie Booker, female    DOB: 1960-09-05, 61 y.o.   MRN: [id number]  HPI The patient is here for physical.  PMH, [facility], social history reviewed and updated  Review of Systems  Constitutional:  Positive for fatigue.  HENT: Negative.    Eyes: Negative.   Respiratory:  Negative for cough, chest tightness and shortness of breath.   Cardiovascular:  Negative for chest pain, palpitations and leg swelling.  Gastrointestinal:  Negative for abdominal distention, abdominal pain, constipation, diarrhea, nausea and vomiting.  Musculoskeletal: Negative.   Skin: Negative.   Neurological: Negative.   Psychiatric/Behavioral: Negative.      Objective:  Physical Exam Constitutional:      Appearance: She is well-developed.  HENT:     Head: Normocephalic and atraumatic.  Cardiovascular:     Rate and Rhythm: Normal rate and regular rhythm.  Pulmonary:     Effort: Pulmonary effort is normal. No respiratory distress.     Breath sounds: Normal breath sounds. No wheezing or rales.  Abdominal:     General: Bowel sounds are normal. There is no distension.     Palpations: Abdomen is soft.     Tenderness: There is no abdominal tenderness. There is no rebound.  Musculoskeletal:     Cervical back: Normal range of motion.  Skin:    General: Skin is warm and dry.  Neurological:     Mental Status: She is alert and oriented to person, place, and time.     Coordination: Coordination normal.     Vitals:   08/17/21 1009  BP: 118/80  Pulse: 72  Resp: 18  SpO2: 95%  Weight: 149 lb 6.4 oz (67.8 kg)  Height: 4' 10.5" (1.486 m)    Assessment & Plan:

## 2021-08-17 NOTE — Patient Instructions (Signed)
We are checking the labs. We have sent in the wegovy to do a weekly shot to help with weight loss.

## 2021-08-17 NOTE — Telephone Encounter (Signed)
Pt has called asking of her lab results from today 08/17/2021.  I was able to inform the pt that they have not been resulted by her PCP as of yet. Transferred pt to [place] as the opt was requesting to speak with the nurse.

## 2021-08-18 ENCOUNTER — Other Ambulatory Visit: Payer: Self-pay | Admitting: Internal Medicine

## 2021-08-18 ENCOUNTER — Encounter: Payer: Self-pay | Admitting: Internal Medicine

## 2021-08-18 DIAGNOSIS — E669 Obesity, unspecified: Secondary | ICD-10-CM | POA: Insufficient documentation

## 2021-08-18 NOTE — Telephone Encounter (Signed)
Spoke with the patient to let her know that once results have been reviewed by Dr. Okey Dupre that I would call her to discuss. She verbalized understanding.

## 2021-08-18 NOTE — Assessment & Plan Note (Signed)
Checking CBC and CMP and lipid panel. BP at goal on losartan 100 mg daily and amlodipine 10 mg daily. Adjust as needed.

## 2021-08-18 NOTE — Assessment & Plan Note (Signed)
Some worsening in this lately due to husband's health, having to work more due to finances. She is taking lexapro 20 mg daily and will continue. She is using coping skills to help.

## 2021-08-18 NOTE — Assessment & Plan Note (Signed)
Checking lipid panel and adjust lipitor 20 mg daily as needed. 

## 2021-08-18 NOTE — Assessment & Plan Note (Signed)
Flu shot yearly. Covid-19 counseled. Shingrix counseled. Tetanus up to date. Colonoscopy up to date. Mammogram due, pap smear not indicated. Counseled about sun safety and mole surveillance. Counseled about the dangers of distracted driving. Given 10 year screening recommendations.

## 2021-08-18 NOTE — Assessment & Plan Note (Signed)
Checking for full resolution. Did resolve after hepatitis B.

## 2021-08-18 NOTE — Assessment & Plan Note (Signed)
Advised to quit and she is unable to make attempt at this time. Reminded about risk/harm of smoking.

## 2021-08-18 NOTE — Assessment & Plan Note (Signed)
Rx wegovy to help with weight loss and we discussed that some insurance companies do not cover this.

## 2021-08-22 ENCOUNTER — Other Ambulatory Visit: Payer: Self-pay | Admitting: Internal Medicine

## 2021-08-22 MED ORDER — ATORVASTATIN CALCIUM 40 MG PO TABS: 40.0000 mg | ORAL_TABLET | Freq: Every day | ORAL | 3 refills | Status: DC

## 2021-08-23 ENCOUNTER — Other Ambulatory Visit: Payer: Self-pay | Admitting: Internal Medicine

## 2021-08-23 ENCOUNTER — Telehealth: Payer: Self-pay

## 2021-08-23 MED ORDER — SAXENDA 18 MG/3ML ~~LOC~~ SOPN: PEN_INJECTOR | SUBCUTANEOUS | 0 refills | Status: AC

## 2021-08-23 MED ORDER — SAXENDA 18 MG/3ML ~~LOC~~ SOPN: 3.0000 mg | PEN_INJECTOR | Freq: Every day | SUBCUTANEOUS | 3 refills | Status: DC

## 2021-08-23 NOTE — Telephone Encounter (Signed)
Pt called stating that she needs Needles sent to pharmacy for Liraglutide -Weight Management (SAXENDA) 18 MG/3ML SOPN  Pharmacy: [id number] [place], [place] - [address]  LOV 08/17/21

## 2021-08-24 ENCOUNTER — Other Ambulatory Visit: Payer: Self-pay | Admitting: Internal Medicine

## 2021-08-24 MED ORDER — "PEN NEEDLES 5/16"" 31G X 8 MM MISC": 0 refills | Status: DC

## 2021-08-29 NOTE — Telephone Encounter (Signed)
Attempted to do a prior authorization on covermymeds for Saxenda Key: [id number]  Unable to complete the PA do it a message popping up and saying medication is no covered by patient's plan

## 2021-08-29 NOTE — Telephone Encounter (Signed)
Pt has called and stated she is needing a PA submitted for  Liraglutide -Weight Management (SAXENDA) 18 MG/3ML SOPN to see if her insurance will cover the medication. Right now she is being charged $800 where as she was being charged $45 before.  **Pt is asking that the medication be sent to [facility] if approved.  I have also advised the pt to call her insurance or pharmacy to see if there is a similar medication covered. Once she finds out to call the clinic back with that information.

## 2021-08-31 NOTE — Telephone Encounter (Signed)
Spoke with the patient and I advised that her covermymeds is giving [place] a error message stating her insurance will not cover the medication. I told the pt to contact her insurance company to see what they will cover and give our office a call back and we will send in a prescription. She verbalized understanding

## 2021-08-31 NOTE — Telephone Encounter (Signed)
Patient called back and states that nurse will need to send something in to  [employer] stating why this medication is necessary - Fax #  [phone]

## 2021-08-31 NOTE — Telephone Encounter (Signed)
Pt called in to check the status of the PA for Saxenda.  I was advised that the PA wasn't complete due to message popping up and saying medication is no covered by patient's plan.  Pt was advised to call insurance to see what medications in that class that they will cover and call us back.  FYI

## 2021-09-08 ENCOUNTER — Telehealth: Payer: Self-pay | Admitting: Internal Medicine

## 2021-09-08 NOTE — Telephone Encounter (Signed)
Pt called and advised that her insurance will not cover Liraglutide -Weight Management (SAXENDA) 18 MG/3ML SOPN. She is requesting an rx that is covered by her insurance.    Please advise.

## 2021-09-14 NOTE — Telephone Encounter (Signed)
Message left for patient today and she has been informed to contact insurance as previously recommended to see what is covered.

## 2021-09-14 NOTE — Telephone Encounter (Signed)
Can she check with her insurance to see if they cover any weight loss medications? Some insurance companies exclude this whole category at which we do have some off label options.

## 2021-09-15 NOTE — Telephone Encounter (Signed)
PA started...   Key: [id number]

## 2021-09-15 NOTE — Telephone Encounter (Signed)
Message from Plan  This request cannot be processed due to the medication is not covered by the plan.

## 2021-10-12 ENCOUNTER — Other Ambulatory Visit: Payer: Self-pay | Admitting: Internal Medicine

## 2021-11-20 ENCOUNTER — Other Ambulatory Visit: Payer: Self-pay | Admitting: Internal Medicine

## 2022-01-02 ENCOUNTER — Other Ambulatory Visit: Payer: Self-pay | Admitting: Internal Medicine

## 2022-01-09 ENCOUNTER — Ambulatory Visit (INDEPENDENT_AMBULATORY_CARE_PROVIDER_SITE_OTHER): Payer: No Typology Code available for payment source | Admitting: Internal Medicine

## 2022-01-09 ENCOUNTER — Telehealth: Payer: Self-pay | Admitting: Internal Medicine

## 2022-01-09 ENCOUNTER — Encounter: Payer: Self-pay | Admitting: Internal Medicine

## 2022-01-09 VITALS — BP 100/62 | HR 85 | Temp 97.8°F | Ht <= 58 in | Wt 147.0 lb

## 2022-01-09 DIAGNOSIS — G932 Benign intracranial hypertension: Secondary | ICD-10-CM

## 2022-01-09 DIAGNOSIS — F17200 Nicotine dependence, unspecified, uncomplicated: Secondary | ICD-10-CM

## 2022-01-09 DIAGNOSIS — E559 Vitamin D deficiency, unspecified: Secondary | ICD-10-CM

## 2022-01-09 DIAGNOSIS — R5383 Other fatigue: Secondary | ICD-10-CM

## 2022-01-09 DIAGNOSIS — I10 Essential (primary) hypertension: Secondary | ICD-10-CM | POA: Diagnosis not present

## 2022-01-09 MED ORDER — LOSARTAN POTASSIUM 100 MG PO TABS: 100.0000 mg | ORAL_TABLET | Freq: Every day | ORAL | 3 refills | Status: DC

## 2022-01-09 MED ORDER — VITAMIN D (ERGOCALCIFEROL) 1.25 MG (50000 UNIT) PO CAPS: 50000.0000 [IU] | ORAL_CAPSULE | ORAL | 3 refills | Status: DC

## 2022-01-09 NOTE — Patient Instructions (Addendum)
We will stop the amlodipine to see if that helps with the energy.

## 2022-01-09 NOTE — Assessment & Plan Note (Signed)
Counseled to quit and she is making an attempt to cut back.

## 2022-01-09 NOTE — Telephone Encounter (Signed)
Stephanie Booker with [phone] calls today in regards to a medical records request that they had received. Stephanie Booker stated that as far as her records show PT had never been seen by them. Not sure if PT had gotten them mixed up with another office or not.  CB for Stephanie Booker if needed: [phone]

## 2022-01-09 NOTE — Assessment & Plan Note (Signed)
Suspect related to low BP. Refilled vitamin D and will plan to keep her on this long term. B12 checked and normal previously.

## 2022-01-09 NOTE — Progress Notes (Signed)
   Subjective:   Patient ID: Stephanie Booker, female    DOB: 1960-11-22, 61 y.o.   MRN: [id number]  HPI The patient is a 61 YO female coming in for fatigue and low BP at times.  Review of Systems  Constitutional:  Positive for fatigue.  HENT: Negative.    Eyes: Negative.   Respiratory:  Negative for cough, chest tightness and shortness of breath.   Cardiovascular:  Negative for chest pain, palpitations and leg swelling.  Gastrointestinal:  Negative for abdominal distention, abdominal pain, constipation, diarrhea, nausea and vomiting.  Musculoskeletal: Negative.   Skin: Negative.   Neurological:  Positive for light-headedness.  Psychiatric/Behavioral: Negative.      Objective:  Physical Exam Constitutional:      Appearance: She is well-developed.  HENT:     Head: Normocephalic and atraumatic.  Cardiovascular:     Rate and Rhythm: Normal rate and regular rhythm.  Pulmonary:     Effort: Pulmonary effort is normal. No respiratory distress.     Breath sounds: Normal breath sounds. No wheezing or rales.  Abdominal:     General: Bowel sounds are normal. There is no distension.     Palpations: Abdomen is soft.     Tenderness: There is no abdominal tenderness. There is no rebound.  Musculoskeletal:     Cervical back: Normal range of motion.  Skin:    General: Skin is warm and dry.  Neurological:     Mental Status: She is alert and oriented to person, place, and time.     Coordination: Coordination normal.     Vitals:   01/09/22 1045  BP: 100/62  Pulse: 85  Temp: 97.8 F (36.6 C)  TempSrc: Oral  SpO2: 95%  Weight: 147 lb (66.7 kg)  Height: 4\' 10"  (1.473 m)    Assessment & Plan:

## 2022-01-09 NOTE — Assessment & Plan Note (Signed)
Given that the diamox 250 mg BID has helped with her symptoms will leave. Will adjust other medications to help with low normal BP.

## 2022-01-09 NOTE — Assessment & Plan Note (Signed)
Suspect low BP causing fatigue and will stop amlodipine 10 mg daily. Keep losartan 100 mg daily and diamox 250 mg BID.

## 2022-01-09 NOTE — Assessment & Plan Note (Signed)
Refilled today and is having more fatigue due to being out.

## 2022-02-15 ENCOUNTER — Other Ambulatory Visit: Payer: Self-pay | Admitting: Internal Medicine

## 2022-03-17 ENCOUNTER — Other Ambulatory Visit: Payer: Self-pay | Admitting: Internal Medicine

## 2022-06-11 ENCOUNTER — Other Ambulatory Visit: Payer: Self-pay | Admitting: Internal Medicine

## 2022-06-14 ENCOUNTER — Other Ambulatory Visit: Payer: Self-pay | Admitting: Internal Medicine

## 2022-06-21 ENCOUNTER — Other Ambulatory Visit: Payer: Self-pay | Admitting: Internal Medicine

## 2022-09-06 ENCOUNTER — Other Ambulatory Visit: Payer: Self-pay | Admitting: Internal Medicine

## 2022-10-22 ENCOUNTER — Other Ambulatory Visit: Payer: Self-pay | Admitting: Internal Medicine

## 2022-10-28 ENCOUNTER — Other Ambulatory Visit: Payer: Self-pay | Admitting: Internal Medicine

## 2022-11-05 ENCOUNTER — Encounter: Payer: Self-pay | Admitting: Internal Medicine

## 2022-11-05 ENCOUNTER — Ambulatory Visit: Payer: No Typology Code available for payment source | Admitting: Internal Medicine

## 2022-11-05 VITALS — BP 124/84 | HR 98 | Temp 98.7°F | Ht <= 58 in | Wt 144.4 lb

## 2022-11-05 DIAGNOSIS — E559 Vitamin D deficiency, unspecified: Secondary | ICD-10-CM | POA: Diagnosis not present

## 2022-11-05 DIAGNOSIS — G43819 Other migraine, intractable, without status migrainosus: Secondary | ICD-10-CM | POA: Diagnosis not present

## 2022-11-05 DIAGNOSIS — R7989 Other specified abnormal findings of blood chemistry: Secondary | ICD-10-CM | POA: Diagnosis not present

## 2022-11-05 DIAGNOSIS — I10 Essential (primary) hypertension: Secondary | ICD-10-CM | POA: Diagnosis not present

## 2022-11-05 MED ORDER — ESCITALOPRAM OXALATE 20 MG PO TABS: 20.0000 mg | ORAL_TABLET | Freq: Every day | ORAL | 1 refills | Status: DC

## 2022-11-05 MED ORDER — NURTEC 75 MG PO TBDP: ORAL_TABLET | ORAL | 11 refills | Status: AC

## 2022-11-05 MED ORDER — AMLODIPINE BESYLATE 10 MG PO TABS: 10.0000 mg | ORAL_TABLET | Freq: Every day | ORAL | 0 refills | Status: DC

## 2022-11-05 MED ORDER — ACETAZOLAMIDE 250 MG PO TABS: 250.0000 mg | ORAL_TABLET | Freq: Two times a day (BID) | ORAL | 0 refills | Status: DC

## 2022-11-05 MED ORDER — ATORVASTATIN CALCIUM 20 MG PO TABS: 20.0000 mg | ORAL_TABLET | Freq: Every day | ORAL | 0 refills | Status: DC

## 2022-11-05 NOTE — Progress Notes (Unsigned)
Patient ID: Stephanie Booker, female   DOB: March 14, 1960, 62 y.o.   MRN: [id number]        Chief Complaint: follow up transformed migraine, htn, low b12 and vit D       HPI:  Stephanie Booker is a 62 y.o. 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   Pt denies polydipsia, polyuria, or new focal neuro s/s.    Pt denies fever, wt loss, night sweats, loss of appetite, or other constitutional symptoms  Denies worsening depressive symptoms, suicidal ideation, or panic; has ongoing anxiety       Wt Readings from Last 3 Encounters:  11/05/22 144 lb 6.4 oz (65.5 kg)  01/09/22 147 lb (66.7 kg)  08/17/21 149 lb 6.4 oz (67.8 kg)   BP Readings from Last 3 Encounters:  11/05/22 124/84  01/09/22 100/62  08/17/21 118/80         Past Medical History:  Diagnosis Date   Anxiety    Hyperlipidemia    OSA (obstructive sleep apnea)    Stroke (HCC)    ocular stroke   Past Surgical History:  Procedure Laterality Date   ABDOMINAL HYSTERECTOMY      reports that she has been smoking cigarettes. She started smoking about 47 years ago. She has a 47.8 pack-year smoking history. She has never used smokeless tobacco. She reports current alcohol use. She reports that she does not use drugs. family history includes Breast cancer in her mother. No Known Allergies Current Outpatient Medications on File Prior to Visit  Medication Sig Dispense Refill   Ascorbic Acid (VITAMIN C PO) Take 1 tablet by mouth daily.     aspirin EC 325 MG tablet Take 1 tablet (325 mg total) by mouth daily. 30 tablet 0   Ferrous Gluconate (IRON 27 PO) Take 1 tablet by mouth daily.     losartan (COZAAR) 100 MG tablet Take 1 tablet (100 mg total) by mouth daily. 90 tablet 3   mesalamine (LIALDA) 1.2 g EC tablet Take 3 tablets by  mouth daily.     Vitamin D, Ergocalciferol, (DRISDOL) 1.25 MG (50000 UNIT) CAPS capsule Take 1 capsule (50,000 Units total) by mouth every 7 (seven) days. 12 capsule 3   No current facility-administered medications on file prior to visit.        ROS:  All others reviewed and negative.  Objective        PE:  BP 124/84 (BP Location: Left Arm, Patient Position: Sitting, Cuff Size: Normal)   Pulse 98   Temp 98.7 F (37.1 C) (Oral)   Ht 4\' 10"  (1.473 m)   Wt 144 lb 6.4 oz (65.5 kg)   SpO2 95%   BMI 30.18 kg/m                 Constitutional: Pt appears in NAD               HENT: Head: NCAT.                Right Ear: External ear normal.                 Left Ear: External ear normal.                Eyes: . Pupils are equal, round,  and reactive to light. Conjunctivae and EOM are normal               Nose: without d/c or deformity               Neck: Neck supple. Gross normal ROM               Cardiovascular: Normal rate and regular rhythm.                 Pulmonary/Chest: Effort normal and breath sounds without rales or wheezing.                Abd:  Soft, NT, ND, + BS, no organomegaly               Neurological: Pt is alert. At baseline orientation, motor grossly intact               Skin: Skin is warm. No rashes, no other new lesions, LE edema - none               Psychiatric: Pt behavior is normal without agitation   Micro: none  Cardiac tracings I have personally interpreted today:  none  Pertinent Radiological findings (summarize): none   Lab Results  Component Value Date   WBC 7.6 08/17/2021   HGB 14.1 08/17/2021   HCT 40.9 08/17/2021   PLT 186.0 08/17/2021   GLUCOSE 90 08/17/2021   CHOL 179 08/17/2021   TRIG 315.0 (H) 08/17/2021   HDL 33.40 (L) 08/17/2021   LDLDIRECT 78.0 08/17/2021   LDLCALC 106 (H) 08/21/2019   ALT 17 08/17/2021   AST 15 08/17/2021   NA 138 08/17/2021   K 3.9 08/17/2021   CL 107 08/17/2021   CREATININE 0.75 08/17/2021   BUN 25 (H) 08/17/2021    CO2 23 08/17/2021   TSH 1.96 02/25/2020   INR 1.0 02/06/2019   HGBA1C 5.7 08/17/2021   Assessment/Plan:  Stephanie Booker is a 6 y.o. White or Caucasian [1] female with  has a past medical history of Anxiety, Hyperlipidemia, OSA (obstructive sleep apnea), and Stroke (HCC).  Essential hypertension BP Readings from Last 3 Encounters:  11/05/22 124/84  01/09/22 100/62  08/17/21 118/80   Stable, pt to continue medical treatment losartan 100 every day, norvasc 10 qd   Low vitamin B12 level Lab Results  Component Value Date   VITAMINB12 356 11/16/2020   Stable, cont oral replacement - b12 1000 mcg qd   Migraine Now c/w transformed migraine, for nurtec 75 every other day if ok with insurance, consider fioricet if now  Vitamin D deficiency Last vitamin D Lab Results  Component Value Date   VD25OH 18.27 (L) 11/16/2020   Low, to start oral replacement  Followup: Return in about 3 months (around 02/05/2023) for follow up Dr Okey Dupre.  Oliver Barre, MD 11/06/2022 8:40 PM [facility]  Primary Care - [facility]

## 2022-11-05 NOTE — Patient Instructions (Signed)
Please take all new medication as prescribed - the nurtec for chronic migraine  Please let us know if this is not possible with the insurance coverage  Please continue all other medications as before, and refills have been done if requested.  Please have the pharmacy call with any other refills you may need.  Please keep your appointments with your specialists as you may have planned

## 2022-11-06 ENCOUNTER — Other Ambulatory Visit (HOSPITAL_COMMUNITY): Payer: Self-pay

## 2022-11-06 ENCOUNTER — Telehealth: Payer: Self-pay | Admitting: Internal Medicine

## 2022-11-06 ENCOUNTER — Encounter: Payer: Self-pay | Admitting: Internal Medicine

## 2022-11-06 ENCOUNTER — Telehealth: Payer: Self-pay

## 2022-11-06 DIAGNOSIS — G43909 Migraine, unspecified, not intractable, without status migrainosus: Secondary | ICD-10-CM | POA: Insufficient documentation

## 2022-11-06 NOTE — Assessment & Plan Note (Signed)
Now c/w transformed migraine, for nurtec 75 every other day if ok with insurance, consider fioricet if now

## 2022-11-06 NOTE — Assessment & Plan Note (Signed)
Lab Results  Component Value Date   VITAMINB12 356 11/16/2020   Stable, cont oral replacement - b12 1000 mcg qd

## 2022-11-06 NOTE — Assessment & Plan Note (Signed)
Last vitamin D Lab Results  Component Value Date   VD25OH 18.27 (L) 11/16/2020   Low, to start oral replacement

## 2022-11-06 NOTE — Telephone Encounter (Signed)
Pharmacy Patient Advocate Encounter   Received notification from Pt Calls Messages that prior authorization for Nurtec 75mg  tabs is required/requested.   Insurance verification completed.   The patient is insured through [facility] .   Per test claim: PA required; PA submitted to [facility] via CoverMyMeds Key/confirmation #/EOC [id number] Status is pending

## 2022-11-06 NOTE — Telephone Encounter (Signed)
Patient needs a prior authorization started for Rimegepant Sulfate (NURTEC) 75 MG TBDP. Best callback is [phone].

## 2022-11-06 NOTE — Assessment & Plan Note (Signed)
BP Readings from Last 3 Encounters:  11/05/22 124/84  01/09/22 100/62  08/17/21 118/80   Stable, pt to continue medical treatment losartan 100 every day, norvasc 10 qd

## 2022-11-08 NOTE — Telephone Encounter (Signed)
Faxed additional information for Nurtec PA to [phone]

## 2022-11-12 ENCOUNTER — Telehealth: Payer: Self-pay | Admitting: Internal Medicine

## 2022-11-12 NOTE — Telephone Encounter (Signed)
Ok to let pt know that by certain criteria, MRI is not indicated at this time, but she should f/u with PCP for persistent or any worsening symptoms.  thanks

## 2022-11-12 NOTE — Telephone Encounter (Signed)
Pt came in last week and is still having really bad headaches and was wondering can she get MRI done because she feel like the medication is not working.

## 2022-11-13 NOTE — Telephone Encounter (Signed)
Called and scheduled Pt OV.

## 2022-11-13 NOTE — Telephone Encounter (Signed)
Pharmacy Patient Advocate Encounter  Received notification from [facility] that Prior Authorization for Nurtec 75mg  tabs has been DENIED.  Full denial letter will be uploaded to the media tab. See denial reason below.   PA #/Case ID/Reference #: [id number]    Please be advised we currently do not have a Pharmacist to review denials, therefore you will need to process appeals accordingly as needed. Thanks for your support at this time. Contact for appeals are as follows: Phone: [phone], Fax: [phone]

## 2022-11-20 ENCOUNTER — Encounter: Payer: Self-pay | Admitting: Internal Medicine

## 2022-11-20 ENCOUNTER — Ambulatory Visit: Payer: No Typology Code available for payment source | Admitting: Internal Medicine

## 2022-11-20 VITALS — BP 140/80 | HR 70 | Temp 98.4°F | Ht <= 58 in | Wt 145.0 lb

## 2022-11-20 DIAGNOSIS — Z0000 Encounter for general adult medical examination without abnormal findings: Secondary | ICD-10-CM

## 2022-11-20 DIAGNOSIS — R7989 Other specified abnormal findings of blood chemistry: Secondary | ICD-10-CM | POA: Diagnosis not present

## 2022-11-20 DIAGNOSIS — F17200 Nicotine dependence, unspecified, uncomplicated: Secondary | ICD-10-CM

## 2022-11-20 DIAGNOSIS — E559 Vitamin D deficiency, unspecified: Secondary | ICD-10-CM

## 2022-11-20 DIAGNOSIS — G43819 Other migraine, intractable, without status migrainosus: Secondary | ICD-10-CM | POA: Diagnosis not present

## 2022-11-20 DIAGNOSIS — E782 Mixed hyperlipidemia: Secondary | ICD-10-CM | POA: Diagnosis not present

## 2022-11-20 DIAGNOSIS — F4323 Adjustment disorder with mixed anxiety and depressed mood: Secondary | ICD-10-CM

## 2022-11-20 DIAGNOSIS — I10 Essential (primary) hypertension: Secondary | ICD-10-CM | POA: Diagnosis not present

## 2022-11-20 LAB — CBC
HCT: 45.4 % (ref 36.0–46.0)
Hemoglobin: 15.1 g/dL — ABNORMAL HIGH (ref 12.0–15.0)
MCHC: 33.3 g/dL (ref 30.0–36.0)
MCV: 96.1 fL (ref 78.0–100.0)
Platelets: 214 10*3/uL (ref 150.0–400.0)
RBC: 4.72 Mil/uL (ref 3.87–5.11)
RDW: 12.9 % (ref 11.5–15.5)
WBC: 7.3 10*3/uL (ref 4.0–10.5)

## 2022-11-20 LAB — COMPREHENSIVE METABOLIC PANEL
ALT: 9 U/L (ref 0–35)
AST: 12 U/L (ref 0–37)
Albumin: 4.1 g/dL (ref 3.5–5.2)
Alkaline Phosphatase: 84 U/L (ref 39–117)
BUN: 15 mg/dL (ref 6–23)
CO2: 25 meq/L (ref 19–32)
Calcium: 9.8 mg/dL (ref 8.4–10.5)
Chloride: 108 meq/L (ref 96–112)
Creatinine, Ser: 0.82 mg/dL (ref 0.40–1.20)
GFR: 76.86 mL/min (ref >60.00)
Glucose, Bld: 76 mg/dL (ref 70–99)
Potassium: 3.3 meq/L — ABNORMAL LOW (ref 3.5–5.1)
Sodium: 140 meq/L (ref 135–145)
Total Bilirubin: 0.4 mg/dL (ref 0.2–1.2)
Total Protein: 6.8 g/dL (ref 6.0–8.3)

## 2022-11-20 LAB — LIPID PANEL
Cholesterol: 188 mg/dL (ref 0–200)
HDL: 40.4 mg/dL (ref >39.00)
Total CHOL/HDL Ratio: 5
Triglycerides: 423 mg/dL — ABNORMAL HIGH (ref 0.0–149.0)

## 2022-11-20 LAB — HEMOGLOBIN A1C: Hgb A1c MFr Bld: 5.3 % (ref 4.6–6.5)

## 2022-11-20 LAB — VITAMIN D 25 HYDROXY (VIT D DEFICIENCY, FRACTURES): VITD: 85.5 ng/mL (ref 30.00–100.00)

## 2022-11-20 LAB — VITAMIN B12: Vitamin B-12: 1065 pg/mL — ABNORMAL HIGH (ref 211–911)

## 2022-11-20 MED ORDER — AMLODIPINE BESYLATE 10 MG PO TABS: 10.0000 mg | ORAL_TABLET | Freq: Every day | ORAL | 3 refills | Status: AC

## 2022-11-20 MED ORDER — ATORVASTATIN CALCIUM 20 MG PO TABS: 20.0000 mg | ORAL_TABLET | Freq: Every day | ORAL | 3 refills | Status: DC

## 2022-11-20 MED ORDER — ACETAZOLAMIDE 250 MG PO TABS: 250.0000 mg | ORAL_TABLET | Freq: Two times a day (BID) | ORAL | 3 refills | Status: DC

## 2022-11-20 MED ORDER — KETOROLAC TROMETHAMINE 10 MG PO TABS: 10.0000 mg | ORAL_TABLET | Freq: Four times a day (QID) | ORAL | 0 refills | Status: AC | PRN

## 2022-11-20 NOTE — Assessment & Plan Note (Signed)
Checking B12 for follow up.

## 2022-11-20 NOTE — Assessment & Plan Note (Signed)
Counseled to stop smoking and not able at this time. Offered lung cancer screening as she qualifies and she is not interested.

## 2022-11-20 NOTE — Assessment & Plan Note (Signed)
Checking CMP for stability.  

## 2022-11-20 NOTE — Assessment & Plan Note (Signed)
BP slightly above goal today but normal 2 weeks ago. Has moderate headache today which could have caused the high BP. Will keep amlodipine 10 mg daily and diamox 250 mg BID and losartan 100 mg daily. Checking CMP and CBC and adjust as needed.

## 2022-11-20 NOTE — Assessment & Plan Note (Signed)
New daily headache for 2-3 weeks unrelenting. She needs CT head to assess. She has tried nurtec 75 mg every other day which has not helped at all. Tylenol helping but minimal. Rx toradol 10 mg oral BID prn for headache and ordered CT head.

## 2022-11-20 NOTE — Assessment & Plan Note (Signed)
Flu shot declines. Shingrix declines. Tetanus up to date. Colonoscopy up to date getting records. Mammogram declines, pap smear not indicated. Counseled about sun safety and mole surveillance. Counseled about the dangers of distracted driving. Given 10 year screening recommendations.

## 2022-11-20 NOTE — Assessment & Plan Note (Signed)
Taking lexapro 20 mg daily and well controlled. Continue.

## 2022-11-20 NOTE — Assessment & Plan Note (Signed)
Checking vitamin D level. Adjust as needed.  

## 2022-11-20 NOTE — Patient Instructions (Addendum)
We will check the labs and the ct scan of the head.  We have sent in toradol (ketorolac) to take up to twice a day as needed for the headaches

## 2022-11-20 NOTE — Progress Notes (Signed)
Subjective:   Patient ID: Stephanie Booker, female    DOB: 03/21/1960, 62 y.o.   MRN: [id number]  HPI The patient is a 62 YO female coming in for physical and new headaches daily. Started 2-3 weeks ago and tried nurtec and this is not helping. Denies vision changes or new numbness or weakness.  [place] for colonoscopy same as last one.  PMH, [facility], social history reviewed and updated  Review of Systems  Constitutional: Negative.   HENT: Negative.    Eyes: Negative.   Respiratory:  Negative for cough, chest tightness and shortness of breath.   Cardiovascular:  Negative for chest pain, palpitations and leg swelling.  Gastrointestinal:  Negative for abdominal distention, abdominal pain, constipation, diarrhea, nausea and vomiting.  Musculoskeletal: Negative.   Skin: Negative.   Neurological:  Positive for headaches.  Psychiatric/Behavioral: Negative.      Objective:  Physical Exam Constitutional:      Appearance: She is well-developed.  HENT:     Head: Normocephalic and atraumatic.  Cardiovascular:     Rate and Rhythm: Regular rhythm.  Pulmonary:     Effort: Pulmonary effort is normal. No respiratory distress.     Breath sounds: Normal breath sounds. No wheezing or rales.  Abdominal:     General: Bowel sounds are normal. There is no distension.     Palpations: Abdomen is soft.     Tenderness: There is no abdominal tenderness. There is no rebound.  Musculoskeletal:        General: Tenderness present.     Cervical back: Normal range of motion.  Skin:    General: Skin is warm and dry.  Neurological:     Mental Status: She is alert and oriented to person, place, and time.     Coordination: Coordination normal.     Vitals:   11/20/22 1515 11/20/22 1517  BP: (!) 140/88 (!) 140/80  Pulse: 70   Temp: 98.4 F (36.9 C)   TempSrc: Oral   SpO2: 96%   Weight: 145 lb (65.8 kg)   Height: 4\' 10"  (1.473 m)     Assessment & Plan:

## 2022-11-20 NOTE — Assessment & Plan Note (Signed)
Checking lipid panel and adjust lipitor 20 mg daily as needed. 

## 2022-11-21 LAB — LDL CHOLESTEROL, DIRECT: Direct LDL: 84 mg/dL

## 2022-11-26 ENCOUNTER — Telehealth: Payer: Self-pay | Admitting: Internal Medicine

## 2022-11-26 NOTE — Telephone Encounter (Signed)
Best call back for Rashel is [phone] Ext [phone]

## 2022-11-26 NOTE — Telephone Encounter (Signed)
Capri calling from [facility] needing authorization for a Ct scan on Wednesday. Janalynn stated she has been trying to reach out but no response. Please advise.

## 2022-11-27 ENCOUNTER — Telehealth: Payer: Self-pay | Admitting: Internal Medicine

## 2022-11-27 ENCOUNTER — Encounter: Payer: Self-pay | Admitting: Internal Medicine

## 2022-11-27 NOTE — Progress Notes (Signed)
LVM for patient to call back for lab results

## 2022-11-27 NOTE — Telephone Encounter (Signed)
Pt need this done by the 6th pt has upcoming appt. Please advise

## 2022-11-27 NOTE — Telephone Encounter (Signed)
DMI got the authorization for this patient.

## 2022-11-27 NOTE — Telephone Encounter (Signed)
Prescription Request  11/27/2022  LOV: 11/20/2022  What is the name of the medication or equipment? ketorolac (TORADOL) 10 MG tablet   Have you contacted your pharmacy to request a refill? Yes   Which pharmacy would you like this sent to?  [id number] [place] [place] - [address] [place] [address] Phone: [phone] Fax: [phone]    Patient notified that their request is being sent to the clinical staff for review and that they should receive a response within 2 business days.   Please advise at Mobile [phone] (mobile)

## 2022-11-27 NOTE — Telephone Encounter (Signed)
This is not due for refill just filled on 11/20/22

## 2022-11-29 ENCOUNTER — Telehealth: Payer: Self-pay | Admitting: Internal Medicine

## 2022-11-29 ENCOUNTER — Ambulatory Visit
Admission: RE | Admit: 2022-11-29 | Discharge: 2022-11-29 | Disposition: A | Discharge status: Home / Self Care | Payer: No Typology Code available for payment source | Source: Ambulatory Visit | Attending: Internal Medicine | Admitting: Internal Medicine

## 2022-11-29 DIAGNOSIS — G43819 Other migraine, intractable, without status migrainosus: Secondary | ICD-10-CM

## 2022-11-29 NOTE — Telephone Encounter (Signed)
Patient states she is having constant headaches, she would like something prescribed for her since she is out of ketorolac (TORADOL) 10 MG tablet and it is not due for a refill yet, [facility]. If any questions best callback is [phone].

## 2022-11-30 NOTE — Telephone Encounter (Signed)
Can you call radiology at [phone] and see if they can read the CT head since she is having so many problems with this pain?

## 2022-11-30 NOTE — Telephone Encounter (Signed)
Called and spoke with diane and she stated that she will make this a stat due to patient still having symptoms

## 2022-12-01 ENCOUNTER — Other Ambulatory Visit: Payer: Self-pay | Admitting: Internal Medicine

## 2022-12-03 ENCOUNTER — Other Ambulatory Visit: Payer: Self-pay | Admitting: Internal Medicine

## 2022-12-03 ENCOUNTER — Telehealth: Payer: Self-pay | Admitting: Internal Medicine

## 2022-12-03 MED ORDER — AMOXICILLIN-POT CLAVULANATE 875-125 MG PO TABS: 1.0000 | ORAL_TABLET | Freq: Two times a day (BID) | ORAL | 0 refills | Status: DC

## 2022-12-03 NOTE — Telephone Encounter (Signed)
Pt called today about the same issues abd

## 2022-12-03 NOTE — Telephone Encounter (Signed)
Pt is still having bad headaches and was wondering is there anything her PCP can prescribed her since she was just seen for this on the 29th of Oct..  Please advise. Thanks

## 2022-12-03 NOTE — Telephone Encounter (Signed)
I have spoke with patient through results notes and have informed her PCP about this

## 2022-12-11 ENCOUNTER — Telehealth: Payer: Self-pay | Admitting: Internal Medicine

## 2022-12-11 NOTE — Telephone Encounter (Signed)
Please refill.

## 2022-12-11 NOTE — Telephone Encounter (Signed)
Prescription Request  12/11/2022  LOV: 11/20/2022  What is the name of the medication or equipment? Toradol 10 mg.  Have you contacted your pharmacy to request a refill? Yes   Which pharmacy would you like this sent to?  [id number] [place], [place] - [address] [place] [address] [place] [address] Phone: [phone] Fax: [phone]   Patient notified that their request is being sent to the clinical staff for review and that they should receive a response within 2 business days.   Please advise at Mobile [phone] (mobile)

## 2022-12-11 NOTE — Telephone Encounter (Signed)
See prior request. This is too early 1 month supply done 11/20/22

## 2022-12-12 MED ORDER — PREDNISONE 20 MG PO TABS: 40.0000 mg | ORAL_TABLET | Freq: Every day | ORAL | 0 refills | Status: DC

## 2022-12-12 NOTE — Telephone Encounter (Signed)
Patient called to request a refill of her medication yesterday. She was informed of Dr. Frutoso Chase response, but she said her head is in a lot of pain. She would like to know what to do.  Best callback is [phone].

## 2022-12-12 NOTE — Telephone Encounter (Signed)
We can try a course of prednisone but I would not recommend refill of toradol

## 2022-12-12 NOTE — Telephone Encounter (Signed)
Inform patient to take either tylenol or aleve in the meantime?

## 2022-12-12 NOTE — Addendum Note (Signed)
Addended by: Hillard Danker A on: 12/12/2022 11:24 AM   Modules accepted: Orders

## 2022-12-12 NOTE — Telephone Encounter (Signed)
Called pt and relayed back the message and informed her that her medication has been called in

## 2022-12-25 ENCOUNTER — Other Ambulatory Visit: Payer: Self-pay | Admitting: Internal Medicine

## 2023-01-08 ENCOUNTER — Telehealth: Payer: Self-pay | Admitting: Internal Medicine

## 2023-01-08 NOTE — Telephone Encounter (Signed)
Her last vitamin D was on high end of normal so I do not recommend refilling this but taking 2000 international units  over the counter daily vitamin D.

## 2023-01-08 NOTE — Telephone Encounter (Signed)
Prescription Request  01/08/2023  LOV: 11/20/2022  What is the name of the medication or equipment? Vitamin  (DRISDOL) 1.25 MG (50000 UNIT) CAPS capsule   Have you contacted your pharmacy to request a refill? No   Which pharmacy would you like this sent to?    [id number] [place], [place] - [address] [place] [place] [address] Phone: [phone] Fax: [phone]   Patient notified that their request is being sent to the clinical staff for review and that they should receive a response within 2 business days.   Please advise at Mobile [phone] (mobile)

## 2023-01-09 ENCOUNTER — Ambulatory Visit: Payer: No Typology Code available for payment source | Admitting: Internal Medicine

## 2023-01-09 NOTE — Telephone Encounter (Signed)
Sent patient a message.

## 2023-04-03 ENCOUNTER — Other Ambulatory Visit: Payer: Self-pay | Admitting: Internal Medicine

## 2023-05-15 ENCOUNTER — Other Ambulatory Visit: Payer: Self-pay | Admitting: Internal Medicine

## 2023-05-15 ENCOUNTER — Other Ambulatory Visit: Payer: Self-pay

## 2023-06-28 ENCOUNTER — Other Ambulatory Visit: Payer: Self-pay | Admitting: Internal Medicine

## 2023-07-01 ENCOUNTER — Other Ambulatory Visit: Payer: Self-pay | Admitting: Internal Medicine

## 2023-07-04 ENCOUNTER — Encounter: Payer: Self-pay | Admitting: Internal Medicine

## 2023-07-04 NOTE — Progress Notes (Signed)
 Subjective:    Patient ID: Stephanie Booker, female    DOB: 07/08/1960, 63 y.o.   MRN: [id number]      HPI Stephanie Booker is here for  Chief Complaint  Patient presents with   Knee Pain    Right knee pain    Worsening right knee pain  - it is chronic and flares up on occasion.  No injury or accident.  She has pain when she gets up from sitting.  It hurts with walking.  It throbs at night - she is not sleeping.    She has been wearing a knee brace and using Voltaren  gel.  The gel helps a little.    Pain in the knee is more on the medial aspect.  No calf or quad pain.  There is swelling in the knee joint.   Medications and allergies reviewed with patient and updated if appropriate.  Current Outpatient Medications on File Prior to Visit  Medication Sig Dispense Refill   acetaZOLAMIDE  (DIAMOX ) 250 MG tablet Take 1 tablet (250 mg total) by mouth 2 (two) times daily. 180 tablet 3   amLODipine  (NORVASC ) 10 MG tablet Take 1 tablet (10 mg total) by mouth daily. 90 tablet 3   Ascorbic Acid (VITAMIN C PO) Take 1 tablet by mouth daily.     aspirin  EC 325 MG tablet Take 1 tablet (325 mg total) by mouth daily. 30 tablet 0   atorvastatin  (LIPITOR) 20 MG tablet TAKE 1 TABLET BY MOUTH EVERY DAY 90 tablet 0   Cobalamin Combinations (FOLTRATE) 500-1 MCG-MG TABS Take by mouth.     escitalopram  (LEXAPRO ) 20 MG tablet TAKE 1 TABLET BY MOUTH EVERY DAY 90 tablet 1   Ferrous Gluconate (IRON 27 PO) Take 1 tablet by mouth daily.     ketorolac  (TORADOL ) 10 MG tablet Take 1 tablet (10 mg total) by mouth every 6 (six) hours as needed. 20 tablet 0   losartan  (COZAAR ) 100 MG tablet TAKE 1 TABLET BY MOUTH EVERY DAY 90 tablet 0   mesalamine (LIALDA) 1.2 g EC tablet Take 3 tablets by mouth daily.     Rimegepant Sulfate (NURTEC) 75 MG TBDP 1 tab by mouth every other day for prevention chronic migraine 16 tablet 11   Vitamin D , Ergocalciferol , (DRISDOL ) 1.25 MG (50000 UNIT) CAPS capsule TAKE 1 CAPSULE (50,000 UNITS  TOTAL) BY MOUTH EVERY 7 (SEVEN) DAYS 12 capsule 3   No current facility-administered medications on file prior to visit.    Review of Systems     Objective:   Vitals:   07/05/23 1301  BP: 120/80  Pulse: 90  Temp: 98.4 F (36.9 C)  SpO2: 94%   BP Readings from Last 3 Encounters:  07/05/23 120/80  11/20/22 (!) 140/80  11/05/22 124/84   Wt Readings from Last 3 Encounters:  07/05/23 145 lb (65.8 kg)  11/20/22 145 lb (65.8 kg)  11/05/22 144 lb 6.4 oz (65.5 kg)   Body mass index is 30.31 kg/m.    Physical Exam    A right knee exam was performed.   SKIN: intact, no bruising or erythema SWELLING: mild  EFFUSION: no  WARMTH: no warmth  TENDERNESS: Mild tenderness on medial aspect of knee-no pain elsewhere ROM: full extension, slightly decreased flexion  GAIT: walks with a limp - unable to fully bear weight on right leg or flex knee NEUROLOGICAL EXAM: normal sensation  CALF TENDERNESS: no       Assessment & Plan:    Encounter Diagnosis  Name Primary?   Chronic pain of right knee Yes   Acute on chronic Has chronic pain in her right knee with intermittent flares No obvious cause for current flare-no injuries or unusual activities ?  Flare of arthritis versus meniscal, collateral ligament injury on medial aspect of knee Knee x-ray today Referral to sports medicine Has tried ibuprofen , topical medications without much relief Prednisone  taper-30 mg daily x 3 days, 20 mg daily x 2 days, 10 mg daily x 3 days Advised icing the

## 2023-07-05 ENCOUNTER — Ambulatory Visit: Admitting: Internal Medicine

## 2023-07-05 ENCOUNTER — Ambulatory Visit (INDEPENDENT_AMBULATORY_CARE_PROVIDER_SITE_OTHER)

## 2023-07-05 VITALS — BP 120/80 | HR 90 | Temp 98.4°F | Ht <= 58 in | Wt 145.0 lb

## 2023-07-05 DIAGNOSIS — G8929 Other chronic pain: Secondary | ICD-10-CM | POA: Insufficient documentation

## 2023-07-05 DIAGNOSIS — M25561 Pain in right knee: Secondary | ICD-10-CM

## 2023-07-05 MED ORDER — PREDNISONE 10 MG PO TABS: ORAL_TABLET | ORAL | 0 refills | Status: AC

## 2023-07-05 NOTE — Patient Instructions (Addendum)
      Have an xray downstairs.      Medications changes include :   prednisone  taper    A referral was ordered sports and someone will call you to schedule an appointment.

## 2023-07-07 ENCOUNTER — Ambulatory Visit: Payer: Self-pay | Admitting: Internal Medicine

## 2023-07-09 NOTE — Telephone Encounter (Signed)
 Copied from CRM [id number]. Topic: Clinical - Lab/Test Results >> Jul 09, 2023  1:03 PM Magdalene School wrote: Reason for CRM: Patient would like call with knee X-ray results.

## 2023-07-10 ENCOUNTER — Telehealth: Payer: Self-pay | Admitting: Internal Medicine

## 2023-07-10 NOTE — Telephone Encounter (Signed)
 Copied from CRM [id number]. Topic: Clinical - Lab/Test Results >> Jul 10, 2023  9:40 AM Kita Perish H wrote: Patient called to obtain Xray results,  gave patient message from provider as stated, patient acknowledged. Advised patient that provider will submit a referral to sports medicine and office will reach out, patient acknowledged.

## 2023-07-10 NOTE — Telephone Encounter (Signed)
 Copied from CRM [id number]. Topic: Appointments - Scheduling Inquiry for Clinic >> Jul 10, 2023  9:44 AM Kita Perish H wrote: Reason for CRM: Patient needs an appointment with sports medicine per provider no referral in system, please reach out to schedule.  Wren [phone]  ---  Please place referral for pt TDW

## 2023-07-12 NOTE — Telephone Encounter (Signed)
 Message sent to [facility] to set patient up for appointment.

## 2023-07-12 NOTE — Telephone Encounter (Signed)
 Left message for patient to call back to schedule. Arvid Birk Teacher, English as a foreign language Sports Medicine)

## 2023-07-31 ENCOUNTER — Other Ambulatory Visit: Payer: Self-pay | Admitting: Internal Medicine

## 2023-09-09 ENCOUNTER — Other Ambulatory Visit: Payer: Self-pay | Admitting: Internal Medicine

## 2023-12-06 ENCOUNTER — Other Ambulatory Visit: Payer: Self-pay | Admitting: Internal Medicine

## 2023-12-12 ENCOUNTER — Telehealth: Payer: Self-pay

## 2023-12-12 NOTE — Telephone Encounter (Signed)
 Copied from CRM [id number]. Topic: General - Call Back - No Documentation >> Dec 12, 2023 10:15 AM Tinnie BROCKS wrote: Reason for CRM: Pt returning call to someone named danielle. She thinks it was about her medications that have been pending since 11/14. Requesting call back [phone]

## 2023-12-13 NOTE — Telephone Encounter (Signed)
 Per pt has been notified that medications are ready for pick up.

## 2023-12-16 ENCOUNTER — Ambulatory Visit: Payer: Self-pay

## 2023-12-16 NOTE — Telephone Encounter (Signed)
 FYI Only or Action Required?: Action required by provider: medication refill request and clinical question for provider.  Patient was last seen in primary care on 07/05/2023 by Geofm Glade PARAS, MD.  Called Nurse Triage reporting Urinary Tract Infection.  Symptoms began several days ago.  Interventions attempted: Nothing.  Symptoms are: unchanged.  Triage Disposition: See Physician Within 24 Hours  Patient/caregiver understands and will follow disposition?: No, wishes to speak with PCP   Copied from CRM #[id number]. Topic: Clinical - Red Word Triage >> Dec 16, 2023  9:47 AM Thersia BROCKS wrote: [facility] that prompted transfer to Nurse Triage: Patient called in stated she has a UTI, burning and going to the bathroom very frequent Answer Assessment - Initial Assessment Questions 1. SYMPTOM: What's the main symptom you're concerned about? (e.g., frequency, incontinence)     Pain with urination, cloudy urine, frequencty 2. ONSET: When did the    start?     Saturday 3. PAIN: Is there any pain? If Yes, ask: How bad is it? (Scale: 1-10; mild, moderate, severe)     5/10 4. CAUSE: What do you think is causing the symptoms?     uti 5. OTHER SYMPTOMS: Do you have any other symptoms? (e.g., blood in urine, fever, flank pain, pain with urination)     Denies fever, chills, n/v, abd pain  Protocols used: Urinary Symptoms-A-AH  Reason for Disposition  Urinating more frequently than usual (i.e., frequency) OR new-onset of the feeling of an urgent need to urinate (i.e., urgency)  Answer Assessment - Initial Assessment Questions 1. SYMPTOM: What's the main symptom you're concerned about? (e.g., frequency, incontinence)     Pain with urination, cloudy urine, frequencty 2. ONSET: When did the    start?     Saturday 3. PAIN: Is there any pain? If Yes, ask: How bad is it? (Scale: 1-10; mild, moderate, severe)     5/10 4. CAUSE: What do you think is causing the symptoms?     uti 5.  OTHER SYMPTOMS: Do you have any other symptoms? (e.g., blood in urine, fever, flank pain, pain with urination)     Denies fever, chills, n/v, abd pain  Protocols used: Urinary Symptoms-A-AH  Reason for Disposition  Urinating more frequently than usual (i.e., frequency) OR new-onset of the feeling of an urgent need to urinate (i.e., urgency)  Answer Assessment - Initial Assessment Questions No available appts with pcp. Patient reports cannot come in for appt, lives in [place] and takes care mother;immobile.  Patient requesting antibiotic prescription/ call back.  Advised UC today/ ED if symptoms worsen.    1. SYMPTOM: What's the main symptom you're concerned about? (e.g., frequency, incontinence)     Pain with urination, cloudy urine, frequencty 2. ONSET: When did the    start?     Saturday 3. PAIN: Is there any pain? If Yes, ask: How bad is it? (Scale: 1-10; mild, moderate, severe)     5/10 4. CAUSE: What do you think is causing the symptoms?     uti 5. OTHER SYMPTOMS: Do you have any other symptoms? (e.g., blood in urine, fever, flank pain, pain with urination)     Denies fever, chills, n/v, abd pain  Protocols used: Urinary Symptoms-A-AH

## 2023-12-18 NOTE — Telephone Encounter (Signed)
 Spoke with pt to schedule appt to come in to assess sx of UTI. Due to pt living a great distance away, advised that pt may go to UC to have urine sample tested and for possible antibiotics, if needed. Pt stated that she would go that route and notify us  with findings. Pt did not need any further assistance at this time.

## 2023-12-31 ENCOUNTER — Encounter: Admitting: Internal Medicine

## 2024-01-05 ENCOUNTER — Other Ambulatory Visit: Payer: Self-pay | Admitting: Internal Medicine

## 2024-02-24 ENCOUNTER — Encounter: Admitting: Internal Medicine

## 2024-02-25 ENCOUNTER — Ambulatory Visit: Admitting: Internal Medicine

## 2024-02-25 ENCOUNTER — Encounter: Payer: Self-pay | Admitting: Internal Medicine

## 2024-02-25 VITALS — BP 130/80 | HR 76 | Temp 97.8°F | Ht <= 58 in | Wt 145.8 lb

## 2024-02-25 DIAGNOSIS — R7989 Other specified abnormal findings of blood chemistry: Secondary | ICD-10-CM

## 2024-02-25 DIAGNOSIS — E559 Vitamin D deficiency, unspecified: Secondary | ICD-10-CM

## 2024-02-25 DIAGNOSIS — Z0000 Encounter for general adult medical examination without abnormal findings: Secondary | ICD-10-CM

## 2024-02-25 DIAGNOSIS — E782 Mixed hyperlipidemia: Secondary | ICD-10-CM

## 2024-02-25 DIAGNOSIS — I10 Essential (primary) hypertension: Secondary | ICD-10-CM

## 2024-02-25 DIAGNOSIS — F17200 Nicotine dependence, unspecified, uncomplicated: Secondary | ICD-10-CM

## 2024-02-25 LAB — URINALYSIS, ROUTINE W REFLEX MICROSCOPIC
Bilirubin Urine: NEGATIVE
Hgb urine dipstick: NEGATIVE
Ketones, ur: NEGATIVE
Leukocytes,Ua: NEGATIVE
Nitrite: NEGATIVE
RBC / HPF: NONE SEEN
Specific Gravity, Urine: 1.015 (ref 1.000–1.030)
Total Protein, Urine: 30 — AB
Urine Glucose: NEGATIVE
Urobilinogen, UA: 0.2 (ref 0.0–1.0)
pH: 6 (ref 5.0–8.0)

## 2024-02-25 LAB — COMPREHENSIVE METABOLIC PANEL WITH GFR
ALT: 12 U/L (ref 3–35)
AST: 16 U/L (ref 5–37)
Albumin: 4.2 g/dL (ref 3.5–5.2)
Alkaline Phosphatase: 76 U/L (ref 39–117)
BUN: 22 mg/dL (ref 6–23)
CO2: 21 meq/L (ref 19–32)
Calcium: 9.7 mg/dL (ref 8.4–10.5)
Chloride: 108 meq/L (ref 96–112)
Creatinine, Ser: 0.77 mg/dL (ref 0.40–1.20)
GFR: 82.15 mL/min
Glucose, Bld: 102 mg/dL — ABNORMAL HIGH (ref 70–99)
Potassium: 3.6 meq/L (ref 3.5–5.1)
Sodium: 138 meq/L (ref 135–145)
Total Bilirubin: 0.3 mg/dL (ref 0.2–1.2)
Total Protein: 7 g/dL (ref 6.0–8.3)

## 2024-02-25 LAB — LIPID PANEL
Cholesterol: 192 mg/dL (ref 28–200)
HDL: 38.6 mg/dL — ABNORMAL LOW
NonHDL: 153.38
Total CHOL/HDL Ratio: 5
Triglycerides: 481 mg/dL — ABNORMAL HIGH (ref 10.0–149.0)
VLDL: 96.2 mg/dL — ABNORMAL HIGH (ref 0.0–40.0)

## 2024-02-25 LAB — CBC
HCT: 43.8 % (ref 36.0–46.0)
Hemoglobin: 15 g/dL (ref 12.0–15.0)
MCHC: 34.2 g/dL (ref 30.0–36.0)
MCV: 93.9 fl (ref 78.0–100.0)
Platelets: 201 10*3/uL (ref 150.0–400.0)
RBC: 4.66 Mil/uL (ref 3.87–5.11)
RDW: 12.9 % (ref 11.5–15.5)
WBC: 7.5 10*3/uL (ref 4.0–10.5)

## 2024-02-25 LAB — LDL CHOLESTEROL, DIRECT: Direct LDL: 88 mg/dL

## 2024-02-25 LAB — VITAMIN B12: Vitamin B-12: 602 pg/mL (ref 211–911)

## 2024-02-25 LAB — TSH: TSH: 1.3 u[IU]/mL (ref 0.35–5.50)

## 2024-02-25 LAB — MICROALBUMIN / CREATININE URINE RATIO
Creatinine,U: 36.8 mg/dL
Microalb Creat Ratio: 817.6 mg/g — ABNORMAL HIGH (ref 0.0–30.0)
Microalb, Ur: 30.1 mg/dL — ABNORMAL HIGH (ref 0.7–1.9)

## 2024-02-25 LAB — HEMOGLOBIN A1C: Hgb A1c MFr Bld: 5.3 % (ref 4.6–6.5)

## 2024-02-25 LAB — VITAMIN D 25 HYDROXY (VIT D DEFICIENCY, FRACTURES): VITD: 49.22 ng/mL (ref 30.00–100.00)

## 2024-02-25 NOTE — Progress Notes (Unsigned)
" ° °  Subjective:   Patient ID: Stephanie Booker, female    DOB: 11-16-1960, 64 y.o.   MRN: [id number]  The patient is here for physical. Pertinent topics discussed: Discussed the use of AI scribe software for clinical note transcription with the patient, who gave verbal consent to proceed.  History of Present Illness Stephanie Booker is a 64 year old female who presents with fatigue, possible UTI, and arthritis pain.  She experiences persistent fatigue, feeling tired and exhausted even upon waking. Despite using a sleep bag, which occasionally leaks, she does not feel rested in the morning. She is able to fall back asleep after waking at night but still feels fatigued.  She suspects a urinary tract infection due to frequent urination, sometimes only passing a drop, and nocturia. She is able to return to sleep after these episodes but remains concerned about the frequency and urgency of urination.  She experiences significant pain in her arm, which she believes is due to arthritis. The pain is described as intense, and she reports needing something for arthritis to be functional during the day. She uses topical creams for relief but feels additional treatment is necessary to maintain functionality.  She expresses concern about possibly having diabetes due to persistent thirst, although previous blood sugar levels were normal. She remains worried about her current status.  She is dealing with significant stressors, including caring for her bedridden mother and coping with the recent loss of her pet of fifteen years. She continues to work and remains physically active despite these challenges. She smokes. No chest pain, tightness, pressure, heart racing, or breathing problems. No new stomach troubles such as diarrhea, constipation, or heartburn.  PMH, [facility], social history reviewed and updated  Review of Systems  Constitutional: Negative.   HENT: Negative.    Eyes: Negative.   Respiratory: Negative.   Negative for cough, chest tightness and shortness of breath.   Cardiovascular: Negative.  Negative for chest pain, palpitations and leg swelling.  Gastrointestinal:  Negative for abdominal distention, abdominal pain, constipation, diarrhea, nausea and vomiting.  Genitourinary:  Positive for dysuria, frequency and urgency.  Musculoskeletal:  Positive for arthralgias.  Skin: Negative.   Neurological: Negative.   Psychiatric/Behavioral: Negative.      Objective:  Physical Exam Constitutional:      Appearance: She is well-developed.  HENT:     Head: Normocephalic and atraumatic.  Cardiovascular:     Rate and Rhythm: Normal rate and regular rhythm.  Pulmonary:     Effort: Pulmonary effort is normal. No respiratory distress.     Breath sounds: Normal breath sounds. No wheezing or rales.  Abdominal:     General: Bowel sounds are normal. There is no distension.     Palpations: Abdomen is soft.     Tenderness: There is no abdominal tenderness.  Musculoskeletal:        General: Tenderness present.     Cervical back: Normal range of motion.  Skin:    General: Skin is warm and dry.  Neurological:     Mental Status: She is alert and oriented to person, place, and time.     Coordination: Coordination normal.     Vitals:   02/25/24 1452  BP: 130/80  Pulse: 76  Temp: 97.8 F (36.6 C)  TempSrc: Oral  SpO2: 97%  Weight: 145 lb 12.8 oz (66.1 kg)  Height: 4' 10 (1.473 m)    Assessment & Plan:   "

## 2024-02-26 ENCOUNTER — Telehealth: Payer: Self-pay

## 2024-02-26 NOTE — Telephone Encounter (Signed)
 Copied from CRM [id number]. Topic: Clinical - Lab/Test Results >> Feb 26, 2024  9:23 AM Berneda FALCON wrote: Reason for CRM: Patient wanted to see if her urine and lab results were in yet. I let her know that it can sometimes take a couple of days to get back and once PCP reviews the results, the nurse will call to review them together. She demonstrated understanding and had no additional questions at this time.

## 2024-02-27 NOTE — Telephone Encounter (Unsigned)
 Copied from CRM [id number]. Topic: Clinical - Lab/Test Results >> Feb 26, 2024  9:23 AM Stephanie Booker wrote: Reason for CRM: Patient wanted to see if her urine and lab results were in yet. I let her know that it can sometimes take a couple of days to get back and once PCP reviews the results, the nurse will call to review them together. She demonstrated understanding and had no additional questions at this time. >> Feb 27, 2024 10:40 AM Stephanie Booker wrote: Stephanie Booker is calling back to check on her lab results. Please call her at  [phone] after doctor reviews lab results. Thanks

## 2024-02-28 ENCOUNTER — Ambulatory Visit: Payer: Self-pay | Admitting: Internal Medicine

## 2024-02-28 DIAGNOSIS — R809 Proteinuria, unspecified: Secondary | ICD-10-CM

## 2024-02-28 MED ORDER — ATORVASTATIN CALCIUM 40 MG PO TABS: 40.0000 mg | ORAL_TABLET | Freq: Every day | ORAL | 3 refills | Status: AC

## 2024-02-28 NOTE — Assessment & Plan Note (Signed)
Counseled to quit and unable to make attempt at this time.

## 2024-02-28 NOTE — Assessment & Plan Note (Signed)
 BP at goal. Checking U/A for dysuria and protein. Adjust as needed.

## 2024-02-28 NOTE — Assessment & Plan Note (Signed)
 Checking B12 level due to worsening fatigue. Adjust as needed.

## 2024-02-28 NOTE — Assessment & Plan Note (Signed)
 Checking vitamin D  and adjust as needed.

## 2024-02-28 NOTE — Assessment & Plan Note (Signed)
 Checking CMP and adjust as needed.

## 2024-02-28 NOTE — Assessment & Plan Note (Signed)
 Checking lipid panel and adjust as needed.

## 2024-02-28 NOTE — Assessment & Plan Note (Signed)
 Flu shot declines. Pneumonia declines. Shingrix declines. Tetanus up to date. Colonoscopy declines. Mammogram declines, pap smear declines. Counseled about sun safety and mole surveillance. Counseled about the dangers of distracted driving. Given 10 year screening recommendations.
